# Patient Record
Sex: Male | Born: 1945 | Race: White | Hispanic: No | Smoking: Former smoker
Health system: Southern US, Community
[De-identification: ages and names within clinical notes are randomized; demographics above are authoritative.]

## PROBLEM LIST (undated history)

## (undated) DIAGNOSIS — B019 Varicella without complication: Secondary | ICD-10-CM

## (undated) DIAGNOSIS — E785 Hyperlipidemia, unspecified: Secondary | ICD-10-CM

## (undated) DIAGNOSIS — J069 Acute upper respiratory infection, unspecified: Secondary | ICD-10-CM

## (undated) DIAGNOSIS — W19XXXA Unspecified fall, initial encounter: Secondary | ICD-10-CM

## (undated) DIAGNOSIS — I639 Cerebral infarction, unspecified: Secondary | ICD-10-CM

## (undated) DIAGNOSIS — G629 Polyneuropathy, unspecified: Secondary | ICD-10-CM

## (undated) DIAGNOSIS — B059 Measles without complication: Secondary | ICD-10-CM

## (undated) DIAGNOSIS — K219 Gastro-esophageal reflux disease without esophagitis: Secondary | ICD-10-CM

## (undated) DIAGNOSIS — T7840XA Allergy, unspecified, initial encounter: Secondary | ICD-10-CM

## (undated) DIAGNOSIS — M1990 Unspecified osteoarthritis, unspecified site: Secondary | ICD-10-CM

## (undated) DIAGNOSIS — G4730 Sleep apnea, unspecified: Secondary | ICD-10-CM

## (undated) DIAGNOSIS — J383 Other diseases of vocal cords: Secondary | ICD-10-CM

## (undated) DIAGNOSIS — Z9189 Other specified personal risk factors, not elsewhere classified: Secondary | ICD-10-CM

## (undated) DIAGNOSIS — E119 Type 2 diabetes mellitus without complications: Secondary | ICD-10-CM

## (undated) DIAGNOSIS — B029 Zoster without complications: Secondary | ICD-10-CM

## (undated) DIAGNOSIS — I739 Peripheral vascular disease, unspecified: Secondary | ICD-10-CM

## (undated) HISTORY — DX: Zoster without complications: B02.9

## (undated) HISTORY — DX: Unspecified osteoarthritis, unspecified site: M19.90

## (undated) HISTORY — DX: Gastro-esophageal reflux disease without esophagitis: K21.9

## (undated) HISTORY — DX: Other specified personal risk factors, not elsewhere classified: Z91.89

## (undated) HISTORY — DX: Sleep apnea, unspecified: G47.30

## (undated) HISTORY — DX: Measles without complication: B05.9

## (undated) HISTORY — DX: Unspecified fall, initial encounter: W19.XXXA

## (undated) HISTORY — DX: Cerebral infarction, unspecified: I63.9

## (undated) HISTORY — DX: Peripheral vascular disease, unspecified: I73.9

## (undated) HISTORY — DX: Type 2 diabetes mellitus without complications: E11.9

## (undated) HISTORY — DX: Other diseases of vocal cords: J38.3

## (undated) HISTORY — DX: Acute upper respiratory infection, unspecified: J06.9

## (undated) HISTORY — DX: Allergy, unspecified, initial encounter: T78.40XA

## (undated) HISTORY — DX: Polyneuropathy, unspecified: G62.9

## (undated) HISTORY — DX: Hyperlipidemia, unspecified: E78.5

## (undated) HISTORY — DX: Varicella without complication: B01.9

---

## 1998-01-19 HISTORY — PX: APPENDECTOMY: SHX54

## 2005-01-19 DIAGNOSIS — I639 Cerebral infarction, unspecified: Secondary | ICD-10-CM

## 2005-01-19 HISTORY — DX: Cerebral infarction, unspecified: I63.9

## 2005-04-16 ENCOUNTER — Emergency Department (HOSPITAL_COMMUNITY): Admission: EM | Admit: 2005-04-16 | Discharge: 2005-04-16 | Payer: Self-pay | Admitting: Emergency Medicine

## 2005-06-09 ENCOUNTER — Encounter: Admission: RE | Admit: 2005-06-09 | Discharge: 2005-06-09 | Payer: Self-pay | Admitting: Family Medicine

## 2007-01-20 LAB — HM COLONOSCOPY: HM Colonoscopy: NORMAL

## 2008-01-20 HISTORY — PX: EYE SURGERY: SHX253

## 2010-09-20 HISTORY — PX: CAROTID ENDARTERECTOMY: SUR193

## 2012-05-17 ENCOUNTER — Other Ambulatory Visit: Payer: Self-pay | Admitting: Internal Medicine

## 2012-05-17 ENCOUNTER — Other Ambulatory Visit: Payer: Self-pay | Admitting: *Deleted

## 2012-05-17 DIAGNOSIS — M6281 Muscle weakness (generalized): Secondary | ICD-10-CM

## 2012-06-10 ENCOUNTER — Other Ambulatory Visit: Payer: Self-pay

## 2012-06-10 DIAGNOSIS — Z1389 Encounter for screening for other disorder: Secondary | ICD-10-CM | POA: Diagnosis not present

## 2013-05-11 ENCOUNTER — Encounter: Payer: Self-pay | Admitting: Family Medicine

## 2013-05-11 ENCOUNTER — Ambulatory Visit (INDEPENDENT_AMBULATORY_CARE_PROVIDER_SITE_OTHER): Payer: Medicare Other | Admitting: Family Medicine

## 2013-05-11 VITALS — BP 130/70 | HR 66 | Temp 98.3°F | Ht 74.0 in | Wt 230.1 lb

## 2013-05-11 DIAGNOSIS — K219 Gastro-esophageal reflux disease without esophagitis: Secondary | ICD-10-CM | POA: Insufficient documentation

## 2013-05-11 DIAGNOSIS — G4730 Sleep apnea, unspecified: Secondary | ICD-10-CM | POA: Insufficient documentation

## 2013-05-11 DIAGNOSIS — T7840XA Allergy, unspecified, initial encounter: Secondary | ICD-10-CM | POA: Diagnosis not present

## 2013-05-11 DIAGNOSIS — E785 Hyperlipidemia, unspecified: Secondary | ICD-10-CM

## 2013-05-11 DIAGNOSIS — G629 Polyneuropathy, unspecified: Secondary | ICD-10-CM | POA: Insufficient documentation

## 2013-05-11 DIAGNOSIS — J383 Other diseases of vocal cords: Secondary | ICD-10-CM | POA: Insufficient documentation

## 2013-05-11 DIAGNOSIS — K0889 Other specified disorders of teeth and supporting structures: Secondary | ICD-10-CM | POA: Diagnosis not present

## 2013-05-11 DIAGNOSIS — B029 Zoster without complications: Secondary | ICD-10-CM | POA: Insufficient documentation

## 2013-05-11 DIAGNOSIS — I779 Disorder of arteries and arterioles, unspecified: Secondary | ICD-10-CM

## 2013-05-11 DIAGNOSIS — Z9189 Other specified personal risk factors, not elsewhere classified: Secondary | ICD-10-CM

## 2013-05-11 DIAGNOSIS — I739 Peripheral vascular disease, unspecified: Secondary | ICD-10-CM

## 2013-05-11 DIAGNOSIS — I639 Cerebral infarction, unspecified: Secondary | ICD-10-CM | POA: Insufficient documentation

## 2013-05-11 DIAGNOSIS — B019 Varicella without complication: Secondary | ICD-10-CM | POA: Insufficient documentation

## 2013-05-11 DIAGNOSIS — E119 Type 2 diabetes mellitus without complications: Secondary | ICD-10-CM | POA: Insufficient documentation

## 2013-05-11 DIAGNOSIS — W19XXXA Unspecified fall, initial encounter: Secondary | ICD-10-CM | POA: Insufficient documentation

## 2013-05-11 DIAGNOSIS — E782 Mixed hyperlipidemia: Secondary | ICD-10-CM | POA: Insufficient documentation

## 2013-05-11 HISTORY — DX: Hyperlipidemia, unspecified: E78.5

## 2013-05-11 HISTORY — DX: Other specified personal risk factors, not elsewhere classified: Z91.89

## 2013-05-11 HISTORY — DX: Disorder of arteries and arterioles, unspecified: I77.9

## 2013-05-11 MED ORDER — FLUTICASONE PROPIONATE 50 MCG/ACT NA SUSP: 2.0000 | Freq: Every day | NASAL | Status: DC

## 2013-05-11 MED ORDER — IPRATROPIUM BROMIDE 0.03 % NA SOLN: 2.0000 | Freq: Two times a day (BID) | NASAL | Status: DC

## 2013-05-11 MED ORDER — INSULIN ASPART 100 UNIT/ML ~~LOC~~ SOLN: 5.0000 [IU] | Freq: Two times a day (BID) | SUBCUTANEOUS | Status: DC

## 2013-05-11 NOTE — Progress Notes (Signed)
Pre visit review using our clinic review tool, if applicable. No additional management support is needed unless otherwise documented below in the visit note. 

## 2013-05-11 NOTE — Patient Instructions (Signed)
Preventive Care for Adults, Male A healthy lifestyle and preventive care can promote health and wellness. Preventive health guidelines for men include the following key practices:  A routine yearly physical is a good way to check with your health care provider about your health and preventative screening. It is a chance to share any concerns and updates on your health and to receive a thorough exam.  Visit your dentist for a routine exam and preventative care every 6 months. Brush your teeth twice a day and floss once a day. Good oral hygiene prevents tooth decay and gum disease.  The frequency of eye exams is based on your age, health, family medical history, use of contact lenses, and other factors. Follow your health care provider's recommendations for frequency of eye exams.  Eat a healthy diet. Foods such as vegetables, fruits, whole grains, low-fat dairy products, and lean protein foods contain the nutrients you need without too many calories. Decrease your intake of foods high in solid fats, added sugars, and salt. Eat the right amount of calories for you.Get information about a proper diet from your health care provider, if necessary.  Regular physical exercise is one of the most important things you can do for your health. Most adults should get at least 150 minutes of moderate-intensity exercise (any activity that increases your heart rate and causes you to sweat) each week. In addition, most adults need muscle-strengthening exercises on 2 or more days a week.  Maintain a healthy weight. The body mass index (BMI) is a screening tool to identify possible weight problems. It provides an estimate of body fat based on height and weight. Your health care provider can find your BMI and can help you achieve or maintain a healthy weight.For adults 20 years and older:  A BMI below 18.5 is considered underweight.  A BMI of 18.5 to 24.9 is normal.  A BMI of 25 to 29.9 is considered  overweight.  A BMI of 30 and above is considered obese.  Maintain normal blood lipids and cholesterol levels by exercising and minimizing your intake of saturated fat. Eat a balanced diet with plenty of fruit and vegetables. Blood tests for lipids and cholesterol should begin at age 42 and be repeated every 5 years. If your lipid or cholesterol levels are high, you are over 50, or you are at high risk for heart disease, you may need your cholesterol levels checked more frequently.Ongoing high lipid and cholesterol levels should be treated with medicines if diet and exercise are not working.  If you smoke, find out from your health care provider how to quit. If you do not use tobacco, do not start.  Lung cancer screening is recommended for adults aged 24 80 years who are at high risk for developing lung cancer because of a history of smoking. A yearly low-dose CT scan of the lungs is recommended for people who have at least a 30-pack-year history of smoking and are a current smoker or have quit within the past 15 years. A pack year of smoking is smoking an average of 1 pack of cigarettes a day for 1 year (for example: 1 pack a day for 30 years or 2 packs a day for 15 years). Yearly screening should continue until the smoker has stopped smoking for at least 15 years. Yearly screening should be stopped for people who develop a health problem that would prevent them from having lung cancer treatment.  If you choose to drink alcohol, do not have  more than 2 drinks per day. One drink is considered to be 12 ounces (355 mL) of beer, 5 ounces (148 mL) of wine, or 1.5 ounces (44 mL) of liquor.  Avoid use of street drugs. Do not share needles with anyone. Ask for help if you need support or instructions about stopping the use of drugs.  High blood pressure causes heart disease and increases the risk of stroke. Your blood pressure should be checked at least every 1 2 years. Ongoing high blood pressure should be  treated with medicines, if weight loss and exercise are not effective.  If you are 75 67 years old, ask your health care provider if you should take aspirin to prevent heart disease.  Diabetes screening involves taking a blood sample to check your fasting blood sugar level. This should be done once every 3 years, after age 19, if you are within normal weight and without risk factors for diabetes. Testing should be considered at a younger age or be carried out more frequently if you are overweight and have at least 1 risk factor for diabetes.  Colorectal cancer can be detected and often prevented. Most routine colorectal cancer screening begins at the age of 47 and continues through age 80. However, your health care provider may recommend screening at an earlier age if you have risk factors for colon cancer. On a yearly basis, your health care provider may provide home test kits to check for hidden blood in the stool. Use of a small camera at the end of a tube to directly examine the colon (sigmoidoscopy or colonoscopy) can detect the earliest forms of colorectal cancer. Talk to your health care provider about this at age 66, when routine screening begins. Direct exam of the colon should be repeated every 5 10 years through age 19, unless early forms of precancerous polyps or small growths are found.  People who are at an increased risk for hepatitis B should be screened for this virus. You are considered at high risk for hepatitis B if:  You were born in a country where hepatitis B occurs often. Talk with your health care provider about which countries are considered high-risk.  Your parents were born in a high-risk country and you have not received a shot to protect against hepatitis B (hepatitis B vaccine).  You have HIV or AIDS.  You use needles to inject street drugs.  You live with, or have sex with, someone who has hepatitis B.  You are a man who has sex with other men (MSM).  You get  hemodialysis treatment.  You take certain medicines for conditions such as cancer, organ transplantation, and autoimmune conditions.  Hepatitis C blood testing is recommended for all people born from [birth year] through 1965 and any individual with known risks for hepatitis C.  Practice safe sex. Use condoms and avoid high-risk sexual practices to reduce the spread of sexually transmitted infections (STIs). STIs include gonorrhea, chlamydia, syphilis, trichomonas, herpes, HPV, and human immunodeficiency virus (HIV). Herpes, HIV, and HPV are viral illnesses that have no cure. They can result in disability, cancer, and death.  A one-time screening for abdominal aortic aneurysm (AAA) and surgical repair of large AAAs by ultrasound are recommended for men ages 94 to 74 years who are current or former smokers.  Healthy men should no longer receive prostate-specific antigen (PSA) blood tests as part of routine cancer screening. Talk with your health care provider about prostate cancer screening.  Testicular cancer screening is not recommended  for adult males who have no symptoms. Screening includes self-exam, a health care provider exam, and other screening tests. Consult with your health care provider about any symptoms you have or any concerns you have about testicular cancer.  Use sunscreen. Apply sunscreen liberally and repeatedly throughout the day. You should seek shade when your shadow is shorter than you. Protect yourself by wearing long sleeves, pants, a wide-brimmed hat, and sunglasses year round, whenever you are outdoors.  Once a month, do a whole-body skin exam, using a mirror to look at the skin on your back. Tell your health care provider about new moles, moles that have irregular borders, moles that are larger than a pencil eraser, or moles that have changed in shape or color.  Stay current with required vaccines (immunizations).  Influenza vaccine. All adults should be immunized every  year.  Tetanus, diphtheria, and acellular pertussis (Td, Tdap) vaccine. An adult who has not previously received Tdap or who does not know his vaccine status should receive 1 dose of Tdap. This initial dose should be followed by tetanus and diphtheria toxoids (Td) booster doses every 10 years. Adults with an unknown or incomplete history of completing a 3-dose immunization series with Td-containing vaccines should begin or complete a primary immunization series including a Tdap dose. Adults should receive a Td booster every 10 years.  Varicella vaccine. An adult without evidence of immunity to varicella should receive 2 doses or a second dose if he has previously received 1 dose.  Human papillomavirus (HPV) vaccine. Males aged 44 21 years who have not received the vaccine previously should receive the 3-dose series. Males aged 43 26 years may be immunized. Immunization is recommended through the age of 50 years for any male who has sex with males and did not get any or all doses earlier. Immunization is recommended for any person with an immunocompromised condition through the age of 23 years if he did not get any or all doses earlier. During the 3-dose series, the second dose should be obtained 4 8 weeks after the first dose. The third dose should be obtained 24 weeks after the first dose and 16 weeks after the second dose.  Zoster vaccine. One dose is recommended for adults aged 96 years or older unless certain conditions are present.  Measles, mumps, and rubella (MMR) vaccine. Adults born before 55 generally are considered immune to measles and mumps. Adults born in [birth year] or later should have 1 or more doses of MMR vaccine unless there is a contraindication to the vaccine or there is laboratory evidence of immunity to each of the three diseases. A routine second dose of MMR vaccine should be obtained at least 28 days after the first dose for students attending postsecondary schools, health care  workers, or international travelers. People who received inactivated measles vaccine or an unknown type of measles vaccine during 1963 1967 should receive 2 doses of MMR vaccine. People who received inactivated mumps vaccine or an unknown type of mumps vaccine before 1979 and are at high risk for mumps infection should consider immunization with 2 doses of MMR vaccine. Unvaccinated health care workers born before [birth year] who lack laboratory evidence of measles, mumps, or rubella immunity or laboratory confirmation of disease should consider measles and mumps immunization with 2 doses of MMR vaccine or rubella immunization with 1 dose of MMR vaccine.  Pneumococcal 13-valent conjugate (PCV13) vaccine. When indicated, a person who is uncertain of his immunization history and has no record of immunization  should receive the PCV13 vaccine. An adult aged 67 years or older who has certain medical conditions and has not been previously immunized should receive 1 dose of PCV13 vaccine. This PCV13 should be followed with a dose of pneumococcal polysaccharide (PPSV23) vaccine. The PPSV23 vaccine dose should be obtained at least 8 weeks after the dose of PCV13 vaccine. An adult aged 79 years or older who has certain medical conditions and previously received 1 or more doses of PPSV23 vaccine should receive 1 dose of PCV13. The PCV13 vaccine dose should be obtained 1 or more years after the last PPSV23 vaccine dose.  Pneumococcal polysaccharide (PPSV23) vaccine. When PCV13 is also indicated, PCV13 should be obtained first. All adults aged 74 years and older should be immunized. An adult younger than age 50 years who has certain medical conditions should be immunized. Any person who resides in a nursing home or long-term care facility should be immunized. An adult smoker should be immunized. People with an immunocompromised condition and certain other conditions should receive both PCV13 and PPSV23 vaccines. People with human  immunodeficiency virus (HIV) infection should be immunized as soon as possible after diagnosis. Immunization during chemotherapy or radiation therapy should be avoided. Routine use of PPSV23 vaccine is not recommended for American Indians, [place] Natives, or people younger than 65 years unless there are medical conditions that require PPSV23 vaccine. When indicated, people who have unknown immunization and have no record of immunization should receive PPSV23 vaccine. One-time revaccination 5 years after the first dose of PPSV23 is recommended for people aged 41 64 years who have chronic kidney failure, nephrotic syndrome, asplenia, or immunocompromised conditions. People who received 1 2 doses of PPSV23 before age 15 years should receive another dose of PPSV23 vaccine at age 48 years or later if at least 5 years have passed since the previous dose. Doses of PPSV23 are not needed for people immunized with PPSV23 at or after age 69 years.  Meningococcal vaccine. Adults with asplenia or persistent complement component deficiencies should receive 2 doses of quadrivalent meningococcal conjugate (MenACWY-D) vaccine. The doses should be obtained at least 2 months apart. Microbiologists working with certain meningococcal bacteria, [place] recruits, people at risk during an outbreak, and people who travel to or live in countries with a high rate of meningitis should be immunized. A first-year college student up through age 7 years who is living in a residence hall should receive a dose if he did not receive a dose on or after his 16th birthday. Adults who have certain high-risk conditions should receive one or more doses of vaccine.  Hepatitis A vaccine. Adults who wish to be protected from this disease, have certain high-risk conditions, work with hepatitis A-infected animals, work in hepatitis A research labs, or travel to or work in countries with a high rate of hepatitis A should be immunized. Adults who were  previously unvaccinated and who anticipate close contact with an international adoptee during the first 60 days after arrival in the [place] from a country with a high rate of hepatitis A should be immunized.  Hepatitis B vaccine. Adults who wish to be protected from this disease, have certain high-risk conditions, may be exposed to blood or other infectious body fluids, are household contacts or sex partners of hepatitis B positive people, are clients or workers in certain care facilities, or travel to or work in countries with a high rate of hepatitis B should be immunized.  Haemophilus influenzae type b (Hib) vaccine. A  previously unvaccinated person with asplenia or sickle cell disease or having a scheduled splenectomy should receive 1 dose of Hib vaccine. Regardless of previous immunization, a recipient of a hematopoietic stem cell transplant should receive a 3-dose series 6 12 months after his successful transplant. Hib vaccine is not recommended for adults with HIV infection. Preventive Service / Frequency Ages 62 to 3  Blood pressure check.** / Every 1 to 2 years.  Lipid and cholesterol check.** / Every 5 years beginning at age 43.  Hepatitis C blood test.** / For any individual with known risks for hepatitis C.  Skin self-exam. / Monthly.  Influenza vaccine. / Every year.  Tetanus, diphtheria, and acellular pertussis (Tdap, Td) vaccine.** / Consult your health care provider. 1 dose of Td every 10 years.  Varicella vaccine.** / Consult your health care provider.  HPV vaccine. / 3 doses over 6 months, if 48 or younger.  Measles, mumps, rubella (MMR) vaccine.** / You need at least 1 dose of MMR if you were born in 1957 or later. You may also need a second dose.  Pneumococcal 13-valent conjugate (PCV13) vaccine.** / Consult your health care provider.  Pneumococcal polysaccharide (PPSV23) vaccine.** / 1 to 2 doses if you smoke cigarettes or if you have certain  conditions.  Meningococcal vaccine.** / 1 dose if you are age 8 to 70 years and a Market researcher living in a residence hall, or have one of several medical conditions. You may also need additional booster doses.  Hepatitis A vaccine.** / Consult your health care provider.  Hepatitis B vaccine.** / Consult your health care provider.  Haemophilus influenzae type b (Hib) vaccine.** / Consult your health care provider. Ages 48 to 32  Blood pressure check.** / Every 1 to 2 years.  Lipid and cholesterol check.** / Every 5 years beginning at age 38.  Lung cancer screening. / Every year if you are aged 40 80 years and have a 30-pack-year history of smoking and currently smoke or have quit within the past 15 years. Yearly screening is stopped once you have quit smoking for at least 15 years or develop a health problem that would prevent you from having lung cancer treatment.  Fecal occult blood test (FOBT) of stool. / Every year beginning at age 4 and continuing until age 70. You may not have to do this test if you get a colonoscopy every 10 years.  Flexible sigmoidoscopy** or colonoscopy.** / Every 5 years for a flexible sigmoidoscopy or every 10 years for a colonoscopy beginning at age 76 and continuing until age 62.  Hepatitis C blood test.** / For all people born from [birth year] through 1965 and any individual with known risks for hepatitis C.  Skin self-exam. / Monthly.  Influenza vaccine. / Every year.  Tetanus, diphtheria, and acellular pertussis (Tdap/Td) vaccine.** / Consult your health care provider. 1 dose of Td every 10 years.  Varicella vaccine.** / Consult your health care provider.  Zoster vaccine.** / 1 dose for adults aged 60 years or older.  Measles, mumps, rubella (MMR) vaccine.** / You need at least 1 dose of MMR if you were born in 1957 or later. You may also need a second dose.  Pneumococcal 13-valent conjugate (PCV13) vaccine.** / Consult your health care  provider.  Pneumococcal polysaccharide (PPSV23) vaccine.** / 1 to 2 doses if you smoke cigarettes or if you have certain conditions.  Meningococcal vaccine.** / Consult your health care provider.  Hepatitis A vaccine.** / Consult your health care  provider.  Hepatitis B vaccine.** / Consult your health care provider.  Haemophilus influenzae type b (Hib) vaccine.** / Consult your health care provider. Ages 65 and over  Blood pressure check.** / Every 1 to 2 years.  Lipid and cholesterol check.**/ Every 5 years beginning at age 20.  Lung cancer screening. / Every year if you are aged 55 80 years and have a 30-pack-year history of smoking and currently smoke or have quit within the past 15 years. Yearly screening is stopped once you have quit smoking for at least 15 years or develop a health problem that would prevent you from having lung cancer treatment.  Fecal occult blood test (FOBT) of stool. / Every year beginning at age 50 and continuing until age 75. You may not have to do this test if you get a colonoscopy every 10 years.  Flexible sigmoidoscopy** or colonoscopy.** / Every 5 years for a flexible sigmoidoscopy or every 10 years for a colonoscopy beginning at age 50 and continuing until age 75.  Hepatitis C blood test.** / For all people born from 1945 through 1965 and any individual with known risks for hepatitis C.  Abdominal aortic aneurysm (AAA) screening.** / A one-time screening for ages 65 to 75 years who are current or former smokers.  Skin self-exam. / Monthly.  Influenza vaccine. / Every year.  Tetanus, diphtheria, and acellular pertussis (Tdap/Td) vaccine.** / 1 dose of Td every 10 years.  Varicella vaccine.** / Consult your health care provider.  Zoster vaccine.** / 1 dose for adults aged 60 years or older.  Pneumococcal 13-valent conjugate (PCV13) vaccine.** / Consult your health care provider.  Pneumococcal polysaccharide (PPSV23) vaccine.** / 1 dose for all  adults aged 65 years and older.  Meningococcal vaccine.** / Consult your health care provider.  Hepatitis A vaccine.** / Consult your health care provider.  Hepatitis B vaccine.** / Consult your health care provider.  Haemophilus influenzae type b (Hib) vaccine.** / Consult your health care provider. **Family history and personal history of risk and conditions may change your health care provider's recommendations. Document Released: 03/03/2001 Document Revised: 10/26/2012 Document Reviewed: 06/02/2010 [employer] Patient Information 2014 [employer]  

## 2013-05-14 ENCOUNTER — Encounter: Payer: Self-pay | Admitting: Family Medicine

## 2013-05-14 NOTE — Assessment & Plan Note (Signed)
Avoid offending foods, start probiotics. Do not eat large meals in late evening and consider raising head of bed.  

## 2013-05-14 NOTE — Progress Notes (Signed)
Patient ID: Matthew [name], male   DOB: 1945/05/04, 68 y.o.   MRN: [id number] TARUN PATCHELL [id number] 07/08/1945 05/14/2013      Progress Note-Follow Up  Subjective  Chief Complaint  Chief Complaint  Patient presents with  . Establish Care    new patient    HPI  Patient is a 68 year old male in today for routine medical care. He is here to establish care. He generally seeks his care at the [facility] but is having trouble getting his carotid artery disease managed year without having to drive to [facility]. He has been told he has significant stenosis in bilateral carotid arteries and he brings in imaging which confirms this today. He is asymptomatic. No syncope or headache. [facility] and [facility] so. Denies CP/palp/SOB/HA/congestion/fevers/GI or GU c/o. Taking meds as prescribed  Past Medical History  Diagnosis Date  . Chicken pox as a child  . Stroke 2007  . Fall     every since his stroke  . Shingles 68 yrs old  . Diabetes mellitus without complication   . Other and unspecified hyperlipidemia 05/11/2013  . Poor dental hygiene 05/11/2013  . Carotid artery disease 05/11/2013  . Measles   . Sleep apnea     uses CP[facility]  . Peripheral neuropathy   . Vocal cord dysfunction   . Allergy   . GERD (gastroesophageal reflux disease)     Past Surgical History  Procedure Laterality Date  . Eye surgery  2010    cataracts bilateral  . Appendectomy  2000    ruptured    Family History  Problem Relation Age of Onset  . Cancer Mother     kidney  . Heart attack Father   . Stroke Brother   . Heart attack Brother   . Stroke Brother     History   Social History  . Marital Status: Divorced    Spouse Name: N/A    Number of Children: N/A  . Years of Education: N/A   Occupational History  . Not on file.   Social History Main Topics  . Smoking status: Former Smoker -- 10 years    Types: Cigars    Start date:  11/27/1997  . Smokeless tobacco: Never Used  . Alcohol Use: No     Comment: drank a fifth of bourbon everyday in the 90's  . Drug Use: No  . Sexual Activity: No     Comment: lives with roommate, minimal carbs   Other Topics Concern  . Not on file   Social History Narrative  . No narrative on file    No current outpatient prescriptions on file prior to visit.   No current facility-administered medications on file prior to visit.    Allergies  Allergen Reactions  . Aggrenox [Aspirin-Dipyridamole Er] Shortness Of Breath    Review of Systems  Review of Systems  Constitutional: Negative for fever, chills and malaise/fatigue.  HENT: Negative for congestion, hearing loss and nosebleeds.   Eyes: Negative for discharge.  Respiratory: Negative for cough, sputum production, shortness of breath and wheezing.   Cardiovascular: Negative for chest pain, palpitations and leg swelling.  Gastrointestinal: Negative for heartburn, nausea, vomiting, abdominal pain, diarrhea, constipation and blood in stool.  Genitourinary: Negative for dysuria, urgency, frequency and hematuria.  Musculoskeletal: Negative for back pain, falls and myalgias.  Skin: Negative for rash.  Neurological: Negative for dizziness, tremors, sensory change, focal weakness, loss  of consciousness, weakness and headaches.  Endo/Heme/Allergies: Negative for polydipsia. Does not bruise/bleed easily.  Psychiatric/Behavioral: Negative for depression and suicidal ideas. The patient is not nervous/anxious and does not have insomnia.     Objective  BP 130/70  Pulse 66  Temp(Src) 98.3 F (36.8 C) (Oral)  Ht 6\' 2"  (1.88 m)  Wt 230 lb 1.9 oz (104.382 kg)  BMI 29.53 kg/m2  SpO2 97%  Physical Exam  Physical Exam  Constitutional: He is oriented to person, place, and time and well-developed, well-nourished, and in no distress. No distress.  HENT:  Head: Normocephalic and atraumatic.  Poor dentition  Eyes: Conjunctivae are  normal.  Neck: Neck supple. No thyromegaly present.  Cardiovascular: Normal rate, regular rhythm and normal heart sounds.   No murmur heard. Pulmonary/Chest: Effort normal and breath sounds normal. No respiratory distress.  Abdominal: He exhibits no distension and no mass. There is no tenderness.  Musculoskeletal: He exhibits no edema.  Neurological: He is alert and oriented to person, place, and time.  Skin: Skin is warm.  Psychiatric: Memory, affect and judgment normal.    Assessment & Plan  Diabetes mellitus without complication [id number] acceptable, minimize simple carbs. Increase exercise as tolerated. Continue current meds  Other and unspecified hyperlipidemia Encouraged heart healthy diet, increase exercise, avoid trans fats, consider a krill oil cap daily. TGs elevated with previous PMD, encouraged to minimize simple carbs and saturated fats.   GERD (gastroesophageal reflux disease) Avoid offending foods, start probiotics. Do not eat large meals in late evening and consider raising head of bed.   Carotid artery disease Patient brings in results of CTA and carotid ultrasound done at [facility] which show hi grade stenosis b/l although results vary somewhat by test. He is only able to receive care for this with the [place] except in [place] and he is unable to get there. Will refer to local vascular practice if we can find coverage.

## 2013-05-14 NOTE — Assessment & Plan Note (Signed)
hgba1c acceptable, minimize simple carbs. Increase exercise as tolerated. Continue current meds 

## 2013-05-14 NOTE — Assessment & Plan Note (Signed)
Patient brings in results of CTA and carotid ultrasound done at [facility] which show hi grade stenosis b/l although results vary somewhat by test. He is only able to receive care for this with the [place] except in [place] and he is unable to get there. Will refer to local vascular practice if we can find coverage.

## 2013-05-14 NOTE — Assessment & Plan Note (Signed)
Encouraged heart healthy diet, increase exercise, avoid trans fats, consider a krill oil cap daily. TGs elevated with previous PMD, encouraged to minimize simple carbs and saturated fats.

## 2013-05-19 ENCOUNTER — Telehealth: Payer: Self-pay

## 2013-05-19 NOTE — Telephone Encounter (Signed)
Relevant patient education assigned to patient using Emmi. ° °

## 2013-05-30 ENCOUNTER — Telehealth: Payer: Self-pay | Admitting: Family Medicine

## 2013-05-30 ENCOUNTER — Encounter: Payer: Medicare Other | Admitting: Vascular Surgery

## 2013-05-30 NOTE — Telephone Encounter (Signed)
Dr Charlett Blake ordered a consult with Dr Curt Jews.  The patient has had to cancel because he [facility] and has learned that they cannot do anything about the blockage in his neck until he has his teeth removed .  He does not have insurance to take care of all that. Do you have any suggestions on what he can do.  The vascular doctors at the [place] have asked the [place] in [place] to remove his teeth.

## 2013-05-30 NOTE — Telephone Encounter (Signed)
Unfortunately I do not have any great ideas, he should proceed with having his teeth out if at all possible and then he can proceed with vascualar

## 2013-06-01 ENCOUNTER — Encounter: Payer: Self-pay | Admitting: Family Medicine

## 2013-06-01 NOTE — Telephone Encounter (Signed)
Patient informed and voiced understanding. Pt states the [place] doctors are setting a consultation in [place] for him

## 2013-07-31 ENCOUNTER — Telehealth: Payer: Self-pay | Admitting: *Deleted

## 2013-07-31 ENCOUNTER — Telehealth: Payer: Self-pay

## 2013-07-31 DIAGNOSIS — E785 Hyperlipidemia, unspecified: Secondary | ICD-10-CM | POA: Diagnosis not present

## 2013-07-31 DIAGNOSIS — E119 Type 2 diabetes mellitus without complications: Secondary | ICD-10-CM

## 2013-07-31 DIAGNOSIS — R531 Weakness: Secondary | ICD-10-CM

## 2013-07-31 LAB — LIPID PANEL
Cholesterol: 161 mg/dL (ref 0–200)
HDL: 25 mg/dL — ABNORMAL LOW (ref >39)
LDL Cholesterol: 92 mg/dL (ref 0–99)
TRIGLYCERIDES: 219 mg/dL — AB (ref <150)
Total CHOL/HDL Ratio: 6.4 Ratio
VLDL: 44 mg/dL — ABNORMAL HIGH (ref 0–40)

## 2013-07-31 LAB — HEMOGLOBIN A1C
HEMOGLOBIN A1C: 6.3 % — AB (ref <5.7)
Mean Plasma Glucose: 134 mg/dL — ABNORMAL HIGH (ref <117)

## 2013-07-31 NOTE — Telephone Encounter (Signed)
Pt came in to the office of labs and told the receptionist that he felt woozy. Pt reports he is diabetic and took 5 units of novolog this morning but fell asleep and did not eat anything. Pt also took 2 benadryl and a tramadol at 6am this morning. Pt's fasting BS is 101. Pt has no other complaints and proceeded to the lab for blood draw without incident.

## 2013-07-31 NOTE — Telephone Encounter (Signed)
Noted.  Pt should book OV if symptoms worsen or if they do not improve.

## 2013-07-31 NOTE — Telephone Encounter (Signed)
Left detailed message on pt's voice mail

## 2013-07-31 NOTE — Telephone Encounter (Signed)
Diabetic Bundle- pt informed and voiced understanding.  ordered labs

## 2013-08-17 ENCOUNTER — Encounter: Payer: Self-pay | Admitting: Family Medicine

## 2013-08-17 ENCOUNTER — Ambulatory Visit (INDEPENDENT_AMBULATORY_CARE_PROVIDER_SITE_OTHER): Payer: Medicare Other | Admitting: Family Medicine

## 2013-08-17 VITALS — BP 122/58 | HR 62 | Temp 98.3°F | Ht 74.0 in | Wt 223.0 lb

## 2013-08-17 DIAGNOSIS — K219 Gastro-esophageal reflux disease without esophagitis: Secondary | ICD-10-CM | POA: Diagnosis not present

## 2013-08-17 DIAGNOSIS — E1159 Type 2 diabetes mellitus with other circulatory complications: Secondary | ICD-10-CM | POA: Diagnosis not present

## 2013-08-17 DIAGNOSIS — I779 Disorder of arteries and arterioles, unspecified: Secondary | ICD-10-CM

## 2013-08-17 DIAGNOSIS — E785 Hyperlipidemia, unspecified: Secondary | ICD-10-CM

## 2013-08-17 DIAGNOSIS — I739 Peripheral vascular disease, unspecified: Secondary | ICD-10-CM

## 2013-08-17 DIAGNOSIS — E119 Type 2 diabetes mellitus without complications: Secondary | ICD-10-CM

## 2013-08-17 MED ORDER — INSULIN ASPART 100 UNIT/ML ~~LOC~~ SOLN: 6.0000 [IU] | Freq: Two times a day (BID) | SUBCUTANEOUS | Status: DC

## 2013-08-17 MED ORDER — CYANOCOBALAMIN 500 MCG PO TABS: 1000.0000 ug | ORAL_TABLET | Freq: Every day | ORAL | Status: DC

## 2013-08-17 NOTE — Assessment & Plan Note (Addendum)
Is working with [facility] to get his surgery covered. He has been classified as a hardship so he does not have to go to [place] to have his surgery but he has to find someone who can perform his endarterectomy nearby. He is working on this with the [place]

## 2013-08-17 NOTE — Patient Instructions (Signed)
°Carotid Artery Disease °The carotid arteries are the two main arteries on either side of the neck that supply blood to the brain. Carotid artery disease, also called carotid artery stenosis, is the narrowing or blockage of one or both carotid arteries. Carotid artery disease increases your risk for a stroke or a transient ischemic attack (TIA). A TIA is an episode in which a waxy, fatty substance that accumulates within the artery (plaque) blocks blood flow to the brain. A TIA is considered a "warning stroke."  °CAUSES  °· Buildup of plaque inside the carotid arteries (atherosclerosis) (common). °· A weakened outpouching in an artery (aneurysm). °· Inflammation of the carotid artery (arteritis). °· A fibrous growth within the carotid artery (fibromuscular dysplasia). °· Tissue death within the carotid artery due to radiation treatment (post-radiation necrosis). °· Decreased blood flow due to spasms of the carotid artery (vasospasm). °· Separation of the walls of the carotid artery (carotid dissection). °RISK FACTORS °· High cholesterol (dyslipidemia).   °· High blood pressure (hypertension).   °· Smoking.   °· Obesity.   °· Diabetes.   °· Family history of cardiovascular disease.   °· Inactivity or lack of regular exercise.   °· Being male. Men have an increased risk of developing atherosclerosis earlier in life than women.   °SYMPTOMS  °Carotid artery disease does not cause symptoms. °DIAGNOSIS °Diagnosis of carotid artery disease may include:  °· A physical exam. Your health care provider may hear an abnormal sound (bruit) when listening to the carotid arteries.   °· Specific tests that look at the blood flow in the carotid arteries. These tests include:   °¨ Carotid artery ultrasonography.   °¨ Carotid or cerebral angiography.   °¨ Computerized tomographic angiography (CTA).   °¨ Magnetic resonance angiography (MRA).   °TREATMENT  °Treatment of carotid artery disease can include a combination of treatments.  Treatment options include: °· Surgery. You may have:   °¨ A carotid endarterectomy. This is a surgery to remove the blockages in the carotid arteries.   °¨ A carotid angioplasty with stenting. This is a nonsurgical interventional procedure. A wire mesh (stent) is used to widen the blocked carotid arteries.   °· Medicines to control blood pressure, cholesterol, and reduce blood clotting (antiplatelet therapy).   °· Adjusting your diet.   °· Lifestyle changes such as:   °¨ Quitting smoking.   °¨ Exercising as tolerated or as directed by your health care provider.   °¨ Controlling and maintaining a good blood pressure.   °¨ Keeping cholesterol levels under control.   °HOME CARE INSTRUCTIONS  °· Take medicines only as directed by your health care provider. Make sure you understand all your medicine instructions. Do not stop your medicines without talking to your health care provider.   °· Follow your health care provider's diet instructions. It is important to eat a healthy diet that is low in saturated fats and includes plenty of fresh fruits, vegetables, and lean meats. High-fat, high-sodium foods as well as foods that are fried, overly processed, or have poor nutritional value should be avoided. °· Maintain a healthy weight.   °· Stay physically active. It is recommended that you get at least 30 minutes of activity every day.   °· Do not use any tobacco products including cigarettes, chewing tobacco, or electronic cigarettes. If you need help quitting, ask your health care provider. °· Limit alcohol use to:   °¨ No more than 2 drinks per day for men.   °¨ No more than 1 drink per day for nonpregnant women.   °· Do not use illegal drugs.   °· Keep all follow-up visits as directed by your health   health care provider.  SEEK IMMEDIATE MEDICAL CARE IF:  You develop TIA or stroke symptoms. These include:   Sudden weakness or numbness on one side of the body, such as in the face, arm, or leg.   Sudden confusion.    Trouble speaking (aphasia) or understanding.   Sudden trouble seeing out of one or both eyes.   Sudden trouble walking.   Dizziness or feeling like you might faint.   Loss of balance or coordination.   Sudden severe headache with no known cause.   Sudden trouble swallowing (dysphagia).  If you have any of these symptoms, call your local emergency services (911 in [place] Do not drive yourself to the clinic or hospital. This is a medical emergency.  Document Released: 03/30/2011 Document Revised: 05/22/2013 Document Reviewed: 07/06/2012 Exit[employer] Patient Information 2015 [employer]. This information is not intended to replace advice given to you by your health care provider. Make sure you discuss any questions you have with your health care provider.  

## 2013-08-17 NOTE — Progress Notes (Signed)
Patient ID: Matthew [name], male   DOB: 22-Dec-1945, 68 y.o.   MRN: [id number] Matthew [name] [id number] 1945/04/13 08/17/2013      Progress Note-Follow Up  Subjective  Chief Complaint  No chief complaint on file.   HPI  Patient is a 68 year old male in today for routine medical care. [facility] so he is not required Patient does not do MRIs due to claustrophobia. No recent illness. [facility] is agreeing to [facility] for his carotid artery surgery but they are trying to find him a local practitioner that does the surgery and takes the [facility] card. Has had all of his teeth  Pulled and his sugar and Triglycerides are much better. Denies CP/palp/SOB/HA/congestion/fevers/GI or GU c/o. Taking meds as prescribed Past Medical History  Diagnosis Date  . Chicken pox as a child  . Stroke 2007  . Fall     every since his stroke  . Shingles 68 yrs old  . Diabetes mellitus without complication   . Other and unspecified hyperlipidemia 05/11/2013  . Poor dental hygiene 05/11/2013  . Carotid artery disease 05/11/2013  . Measles   . Sleep apnea     uses CPAP[facility]  . Peripheral neuropathy   . Vocal cord dysfunction   . Allergy   . GERD (gastroesophageal reflux disease)     Past Surgical History  Procedure Laterality Date  . Eye surgery  2010    cataracts bilateral  . Appendectomy  2000    ruptured    Family History  Problem Relation Age of Onset  . Cancer Mother     kidney  . Heart attack Father   . Stroke Brother   . Heart attack Brother   . Stroke Brother     History   Social History  . Marital Status: Divorced    Spouse Name: N/A    Number of Children: N/A  . Years of Education: N/A   Occupational History  . Not on file.   Social History Main Topics  . Smoking status: Former Smoker -- 10 years    Types: Cigars    Start date: 11/27/1997  . Smokeless tobacco: Never Used  . Alcohol Use: No     Comment: drank a fifth of bourbon everyday in the 90's   . Drug Use: No  . Sexual Activity: No     Comment: lives with roommate, minimal carbs   Other Topics Concern  . Not on file   Social History Narrative  . No narrative on file    Current Outpatient Prescriptions on File Prior to Visit  Medication Sig Dispense Refill  . clopidogrel (PLAVIX) 75 MG tablet Take 75 mg by mouth daily with breakfast.      . Fish Oil OIL by Does not apply route.      . fluticasone (FLONASE) 50 MCG/ACT nasal spray Place 2 sprays into both nostrils daily.  16 g  6  . insulin glargine (LANTUS) 100 UNIT/ML injection Inject 60 Units into the skin at bedtime.      Marland Kitchen ipratropium (ATROVENT) 0.03 % nasal spray Place 2 sprays into both nostrils every 12 (twelve) hours.  30 mL  12   No current facility-administered medications on file prior to visit.    Allergies  Allergen Reactions  . Aggrenox [Aspirin-Dipyridamole Er] Shortness Of Breath    Review of Systems  Review of Systems  Constitutional: Negative for fever and malaise/fatigue.  HENT: Negative for  congestion.   Eyes: Negative for discharge.  Respiratory: Negative for shortness of breath.   Cardiovascular: Negative for chest pain, palpitations and leg swelling.  Gastrointestinal: Negative for nausea, abdominal pain and diarrhea.  Genitourinary: Negative for dysuria.  Musculoskeletal: Negative for falls.  Skin: Negative for rash.  Neurological: Negative for loss of consciousness and headaches.  Endo/Heme/Allergies: Negative for polydipsia.  Psychiatric/Behavioral: Negative for depression and suicidal ideas. The patient is not nervous/anxious and does not have insomnia.     Objective  BP 122/58  Pulse 62  Temp(Src) 98.3 F (36.8 C) (Oral)  Ht 6\' 2"  (1.88 m)  Wt 223 lb (101.152 kg)  BMI 28.62 kg/m2  SpO2 97%  Physical Exam  Physical Exam  Constitutional: He is oriented to person, place, and time and well-developed, well-nourished, and in no distress. No distress.  HENT:  Head:  Normocephalic and atraumatic.  Eyes: Conjunctivae are normal.  Neck: Neck supple. No thyromegaly present.  Cardiovascular: Normal rate, regular rhythm and normal heart sounds.   No murmur heard. Pulmonary/Chest: Effort normal and breath sounds normal. No respiratory distress.  Abdominal: He exhibits no distension and no mass. There is no tenderness.  Musculoskeletal: He exhibits no edema.  Neurological: He is alert and oriented to person, place, and time.  Skin: Skin is warm.  Psychiatric: Memory, affect and judgment normal.     Lab Results  Component Value Date   CHOL 161 07/31/2013   Lab Results  Component Value Date   HDL 25* 07/31/2013   Lab Results  Component Value Date   LDLCALC 92 07/31/2013   Lab Results  Component Value Date   TRIG 219* 07/31/2013   Lab Results  Component Value Date   CHOLHDL 6.4 07/31/2013     Assessment & Plan  Carotid artery disease Is working with [facility] to get his surgery covered. He has been classified as a hardship so he does not have to go to [place] to have his surgery but he has to find someone who can perform his endarterectomy nearby. [facility]  GERD (gastroesophageal reflux disease) Avoid offending foods, start probiotics. Do not eat large meals in late evening and consider raising head of bed.   Diabetes mellitus without complication [id number] acceptable, minimize simple carbs. Increase exercise as tolerated. Continue current meds  Other and unspecified hyperlipidemia Encouraged heart healthy diet, increase exercise, avoid trans fats, consider a krill oil cap daily

## 2013-08-21 ENCOUNTER — Encounter: Payer: Self-pay | Admitting: Family Medicine

## 2013-08-21 NOTE — Assessment & Plan Note (Signed)
Encouraged heart healthy diet, increase exercise, avoid trans fats, consider a krill oil cap daily 

## 2013-08-21 NOTE — Assessment & Plan Note (Signed)
Avoid offending foods, start probiotics. Do not eat large meals in late evening and consider raising head of bed.  

## 2013-08-21 NOTE — Assessment & Plan Note (Signed)
hgba1c acceptable, minimize simple carbs. Increase exercise as tolerated. Continue current meds 

## 2013-08-29 ENCOUNTER — Encounter: Payer: Medicare Other | Admitting: Vascular Surgery

## 2013-09-19 ENCOUNTER — Ambulatory Visit: Payer: Self-pay | Admitting: Vascular Surgery

## 2013-09-19 DIAGNOSIS — Z01812 Encounter for preprocedural laboratory examination: Secondary | ICD-10-CM | POA: Diagnosis not present

## 2013-09-19 LAB — CBC
HCT: 41.9 % (ref 40.0–52.0)
HGB: 14.5 g/dL (ref 13.0–18.0)
MCH: 32.3 pg (ref 26.0–34.0)
MCHC: 34.6 g/dL (ref 32.0–36.0)
MCV: 93 fL (ref 80–100)
Platelet: 182 10*3/uL (ref 150–440)
RBC: 4.49 10*6/uL (ref 4.40–5.90)
RDW: 13.8 % (ref 11.5–14.5)
WBC: 6.7 10*3/uL (ref 3.8–10.6)

## 2013-09-19 LAB — BASIC METABOLIC PANEL
Anion Gap: 6 — ABNORMAL LOW (ref 7–16)
BUN: 12 mg/dL (ref 7–18)
CO2: 30 mmol/L (ref 21–32)
Calcium, Total: 8.9 mg/dL (ref 8.5–10.1)
Chloride: 105 mmol/L (ref 98–107)
Creatinine: 1.18 mg/dL (ref 0.60–1.30)
EGFR (African American): >60
GLUCOSE: 248 mg/dL — AB (ref 65–99)
OSMOLALITY: 289 (ref 275–301)
Potassium: 4.3 mmol/L (ref 3.5–5.1)
Sodium: 141 mmol/L (ref 136–145)

## 2013-09-19 LAB — HM DIABETES EYE EXAM

## 2013-09-22 ENCOUNTER — Telehealth: Payer: Self-pay | Admitting: Pediatrics

## 2013-09-22 NOTE — Telephone Encounter (Signed)
Charlyne Quale want to know if patient have had a EKG done. Please advise Clover Mealy  # [phone]

## 2013-09-26 NOTE — Telephone Encounter (Signed)
It doesn't look like Dr Charlett Blake did one? If this is for the surgery clearance, Dr Charlett Blake stated in her AVS that patient is working with the [facility] for clearance?

## 2013-09-28 ENCOUNTER — Inpatient Hospital Stay: Payer: Self-pay | Admitting: Vascular Surgery

## 2013-09-28 DIAGNOSIS — E119 Type 2 diabetes mellitus without complications: Secondary | ICD-10-CM | POA: Diagnosis present

## 2013-09-28 DIAGNOSIS — I658 Occlusion and stenosis of other precerebral arteries: Secondary | ICD-10-CM | POA: Diagnosis present

## 2013-09-28 DIAGNOSIS — I6529 Occlusion and stenosis of unspecified carotid artery: Secondary | ICD-10-CM | POA: Diagnosis present

## 2013-09-28 DIAGNOSIS — I708 Atherosclerosis of other arteries: Secondary | ICD-10-CM | POA: Diagnosis not present

## 2013-09-29 LAB — CBC WITH DIFFERENTIAL/PLATELET
BASOS ABS: 0.1 10*3/uL (ref 0.0–0.1)
BASOS PCT: 0.7 %
EOS PCT: 0 %
Eosinophil #: 0 10*3/uL (ref 0.0–0.7)
HCT: 43 % (ref 40.0–52.0)
HGB: 14.3 g/dL (ref 13.0–18.0)
LYMPHS ABS: 0.8 10*3/uL — AB (ref 1.0–3.6)
LYMPHS PCT: 6.4 %
MCH: 31.2 pg (ref 26.0–34.0)
MCHC: 33.3 g/dL (ref 32.0–36.0)
MCV: 94 fL (ref 80–100)
MONO ABS: 0.5 x10 3/mm (ref 0.2–1.0)
Monocyte %: 4.2 %
NEUTROS ABS: 10.9 10*3/uL — AB (ref 1.4–6.5)
NEUTROS PCT: 88.7 %
Platelet: 181 10*3/uL (ref 150–440)
RBC: 4.59 10*6/uL (ref 4.40–5.90)
RDW: 14.1 % (ref 11.5–14.5)
WBC: 12.3 10*3/uL — ABNORMAL HIGH (ref 3.8–10.6)

## 2013-09-29 LAB — BASIC METABOLIC PANEL
ANION GAP: 7 (ref 7–16)
BUN: 16 mg/dL (ref 7–18)
CO2: 25 mmol/L (ref 21–32)
CREATININE: 1.22 mg/dL (ref 0.60–1.30)
Calcium, Total: 8.2 mg/dL — ABNORMAL LOW (ref 8.5–10.1)
Chloride: 103 mmol/L (ref 98–107)
EGFR (African American): >60
GLUCOSE: 216 mg/dL — AB (ref 65–99)
OSMOLALITY: 278 (ref 275–301)
POTASSIUM: 4.8 mmol/L (ref 3.5–5.1)
Sodium: 135 mmol/L — ABNORMAL LOW (ref 136–145)

## 2013-09-29 LAB — PROTIME-INR
INR: 1.1
PROTHROMBIN TIME: 14.4 s (ref 11.5–14.7)

## 2013-09-29 LAB — APTT: ACTIVATED PTT: 30.7 s (ref 23.6–35.9)

## 2013-10-02 LAB — PATHOLOGY REPORT

## 2014-01-02 LAB — HM DIABETES FOOT EXAM

## 2014-02-13 ENCOUNTER — Ambulatory Visit (INDEPENDENT_AMBULATORY_CARE_PROVIDER_SITE_OTHER): Payer: Medicare Other | Admitting: Family Medicine

## 2014-02-13 ENCOUNTER — Encounter: Payer: Self-pay | Admitting: Family Medicine

## 2014-02-13 VITALS — BP 143/65 | HR 64 | Temp 98.2°F | Ht 74.0 in | Wt 228.4 lb

## 2014-02-13 DIAGNOSIS — E119 Type 2 diabetes mellitus without complications: Secondary | ICD-10-CM

## 2014-02-13 DIAGNOSIS — E782 Mixed hyperlipidemia: Secondary | ICD-10-CM

## 2014-02-13 DIAGNOSIS — K219 Gastro-esophageal reflux disease without esophagitis: Secondary | ICD-10-CM

## 2014-02-13 DIAGNOSIS — J019 Acute sinusitis, unspecified: Secondary | ICD-10-CM

## 2014-02-13 DIAGNOSIS — J069 Acute upper respiratory infection, unspecified: Secondary | ICD-10-CM | POA: Diagnosis not present

## 2014-02-13 MED ORDER — AMOXICILLIN 500 MG PO CAPS: 500.0000 mg | ORAL_CAPSULE | Freq: Three times a day (TID) | ORAL | Status: DC

## 2014-02-13 MED ORDER — VARDENAFIL HCL 10 MG PO TABS: ORAL_TABLET | ORAL | Status: DC

## 2014-02-13 NOTE — Patient Instructions (Signed)
Encouraged increased rest and hydration, add probiotics such as Digestive Advantage or [employer], zinc such as Coldeze or Xicam. Treat fevers as needed. Plain Mucinex twice daily for 10 days   Upper Respiratory Infection, Adult An upper respiratory infection (URI) is also sometimes known as the common cold. The upper respiratory tract includes the nose, sinuses, throat, trachea, and bronchi. Bronchi are the airways leading to the lungs. Most people improve within 1 week, but symptoms can last up to 2 weeks. A residual cough may last even longer.  CAUSES Many different viruses can infect the tissues lining the upper respiratory tract. The tissues become irritated and inflamed and often become very moist. Mucus production is also common. A cold is contagious. You can easily spread the virus to others by oral contact. This includes kissing, sharing a glass, coughing, or sneezing. Touching your mouth or nose and then touching a surface, which is then touched by another person, can also spread the virus. SYMPTOMS  Symptoms typically develop 1 to 3 days after you come in contact with a cold virus. Symptoms vary from person to person. They may include:  Runny nose.  Sneezing.  Nasal congestion.  Sinus irritation.  Sore throat.  Loss of voice (laryngitis).  Cough.  Fatigue.  Muscle aches.  Loss of appetite.  Headache.  Low-grade fever. DIAGNOSIS  You might diagnose your own cold based on familiar symptoms, since most people get a cold 2 to 3 times a year. Your caregiver can confirm this based on your exam. Most importantly, your caregiver can check that your symptoms are not due to another disease such as strep throat, sinusitis, pneumonia, asthma, or epiglottitis. Blood tests, throat tests, and X-rays are not necessary to diagnose a common cold, but they may sometimes be helpful in excluding other more serious diseases. Your caregiver will decide if any further tests are  required. RISKS AND COMPLICATIONS  You may be at risk for a more severe case of the common cold if you smoke cigarettes, have chronic heart disease (such as heart failure) or lung disease (such as asthma), or if you have a weakened immune system. The very young and very old are also at risk for more serious infections. Bacterial sinusitis, middle ear infections, and bacterial pneumonia can complicate the common cold. The common cold can worsen asthma and chronic obstructive pulmonary disease (COPD). Sometimes, these complications can require emergency medical care and may be life-threatening. PREVENTION  The best way to protect against getting a cold is to practice good hygiene. Avoid oral or hand contact with people with cold symptoms. Wash your hands often if contact occurs. There is no clear evidence that vitamin C, vitamin E, echinacea, or exercise reduces the chance of developing a cold. However, it is always recommended to get plenty of rest and practice good nutrition. TREATMENT  Treatment is directed at relieving symptoms. There is no cure. Antibiotics are not effective, because the infection is caused by a virus, not by bacteria. Treatment may include:  Increased fluid intake. Sports drinks offer valuable electrolytes, sugars, and fluids.  Breathing heated mist or steam (vaporizer or shower).  Eating chicken soup or other clear broths, and maintaining good nutrition.  Getting plenty of rest.  Using gargles or lozenges for comfort.  Controlling fevers with ibuprofen or acetaminophen as directed by your caregiver.  Increasing usage of your inhaler if you have asthma. Zinc gel and zinc lozenges, taken in the first 24 hours of the common cold, can shorten  the duration and lessen the severity of symptoms. Pain medicines may help with fever, muscle aches, and throat pain. A variety of non-prescription medicines are available to treat congestion and runny nose. Your caregiver can make  recommendations and may suggest nasal or lung inhalers for other symptoms.  HOME CARE INSTRUCTIONS   Only take over-the-counter or prescription medicines for pain, discomfort, or fever as directed by your caregiver.  Use a warm mist humidifier or inhale steam from a shower to increase air moisture. This may keep secretions moist and make it easier to breathe.  Drink enough water and fluids to keep your urine clear or pale yellow.  Rest as needed.  Return to work when your temperature has returned to normal or as your caregiver advises. You may need to stay home longer to avoid infecting others. You can also use a face mask and careful hand washing to prevent spread of the virus. SEEK MEDICAL CARE IF:   After the first few days, you feel you are getting worse rather than better.  You need your caregiver's advice about medicines to control symptoms.  You develop chills, worsening shortness of breath, or brown or red sputum. These may be signs of pneumonia.  You develop yellow or brown nasal discharge or pain in the face, especially when you bend forward. These may be signs of sinusitis.  You develop a fever, swollen neck glands, pain with swallowing, or white areas in the back of your throat. These may be signs of strep throat. SEEK IMMEDIATE MEDICAL CARE IF:   You have a fever.  You develop severe or persistent headache, ear pain, sinus pain, or chest pain.  You develop wheezing, a prolonged cough, cough up blood, or have a change in your usual mucus (if you have chronic lung disease).  You develop sore muscles or a stiff neck. Document Released: 07/01/2000 Document Revised: 03/30/2011 Document Reviewed: 04/12/2013 [employer] Patient Information 2015 [place], [place]. This information is not intended to replace advice given to you by your health care provider. Make sure you discuss any questions you have with your health care provider.

## 2014-02-13 NOTE — Progress Notes (Signed)
Patient ID: Matthew [name], male   DOB: 19-Feb-1945, 69 y.o.   MRN: [id number]   Matthew [name]  [id number] [birth year] 02/13/2014      Progress Note-Follow Up  Subjective  Chief Complaint  Chief Complaint  Patient presents with  . Follow-up    6 mos    HPI  Patient is a 69 y.o. male in today for routine medical care. Patient in for follow up and had been doing well til this week when he developed increased congestion, cough, malaise, notes head and chest congestion. Has been taking Nyquil and Alka Seltzer with partial relief. Denies CP/palp/SOB/fevers/GI or GU c/o. Taking meds as prescribed  Past Medical History  Diagnosis Date  . Chicken pox as a child  . Stroke 2007  . Fall     every since his stroke  . Shingles 69 yrs old  . Diabetes mellitus without complication   . Other and unspecified hyperlipidemia 05/11/2013  . Poor dental hygiene 05/11/2013  . Carotid artery disease 05/11/2013  . Measles   . Sleep apnea     [facility]  . Peripheral neuropathy   . Vocal cord dysfunction   . Allergy   . GERD (gastroesophageal reflux disease)     Past Surgical History  Procedure Laterality Date  . Eye surgery  2010    cataracts bilateral  . Appendectomy  2000    ruptured    Family History  Problem Relation Age of Onset  . Cancer Mother     kidney  . Heart attack Father   . Stroke Brother   . Heart attack Brother   . Stroke Brother     History   Social History  . Marital Status: Divorced    Spouse Name: N/A    Number of Children: N/A  . Years of Education: N/A   Occupational History  . Not on file.   Social History Main Topics  . Smoking status: Former Smoker -- 10 years    Types: Cigars    Start date: 11/27/1997  . Smokeless tobacco: Never Used  . Alcohol Use: No     Comment: drank a fifth of bourbon everyday in the 90's  . Drug Use: No  . Sexual Activity: No     Comment: lives with roommate, minimal carbs   Other Topics Concern  . Not on  file   Social History Narrative    Current Outpatient Prescriptions on File Prior to Visit  Medication Sig Dispense Refill  . fluticasone (FLONASE) 50 MCG/ACT nasal spray Place 2 sprays into both nostrils daily. 16 g 6  . insulin glargine (LANTUS) 100 UNIT/ML injection Inject 60 Units into the skin at bedtime.    Marland Kitchen ipratropium (ATROVENT) 0.03 % nasal spray Place 2 sprays into both nostrils every 12 (twelve) hours. 30 mL 12   No current facility-administered medications on file prior to visit.    Allergies  Allergen Reactions  . Aggrenox [Aspirin-Dipyridamole Er] Shortness Of Breath    Review of Systems  Review of Systems  Constitutional: Positive for malaise/fatigue. Negative for fever.  HENT: Positive for congestion and sore throat. Negative for ear pain.   Eyes: Negative for discharge.  Respiratory: Positive for cough and sputum production. Negative for shortness of breath.   Cardiovascular: Negative for chest pain, palpitations and leg swelling.  Gastrointestinal: Negative for nausea, abdominal pain and diarrhea.  Genitourinary: Negative for dysuria.  Musculoskeletal: Positive for myalgias. Negative for falls.  Skin: Negative for rash.  Neurological: Negative for loss of consciousness and headaches.  Endo/Heme/Allergies: Negative for polydipsia.  Psychiatric/Behavioral: Negative for depression and suicidal ideas. The patient is not nervous/anxious and does not have insomnia.     Objective  BP 143/65 mmHg  Pulse 64  Temp(Src) 98.2 F (36.8 C) (Oral)  Ht 6\' 2"  (1.88 m)  Wt 228 lb 6.4 oz (103.602 kg)  BMI 29.31 kg/m2  SpO2 98%  Physical Exam  Physical Exam  Constitutional: He is oriented to person, place, and time and well-developed, well-nourished, and in no distress. No distress.  HENT:  Head: Normocephalic and atraumatic.  Eyes: Conjunctivae are normal.  Neck: Neck supple. No thyromegaly present.  Cardiovascular: Normal rate, regular rhythm and normal heart  sounds.   No murmur heard. Pulmonary/Chest: Effort normal and breath sounds normal. No respiratory distress.  Abdominal: He exhibits no distension and no mass. There is no tenderness.  Musculoskeletal: He exhibits no edema.  Neurological: He is alert and oriented to person, place, and time.  Skin: Skin is warm.  Psychiatric: Memory, affect and judgment normal.    No results found for: TSH No results found for: WBC, HGB, HCT, MCV, PLT No results found for: CREATININE, BUN, NA, K, CL, CO2 No results found for: ALT, AST, GGT, ALKPHOS, BILITOT Lab Results  Component Value Date   CHOL 161 07/31/2013   Lab Results  Component Value Date   HDL 25* 07/31/2013   Lab Results  Component Value Date   LDLCALC 92 07/31/2013   Lab Results  Component Value Date   TRIG 219* 07/31/2013   Lab Results  Component Value Date   CHOLHDL 6.4 07/31/2013     Assessment & Plan  GERD (gastroesophageal reflux disease) Avoid offending foods, start probiotics. Do not eat large meals in late evening and consider raising head of bed.    Diabetes mellitus without complication [id number] acceptable, minimize simple carbs. Increase exercise as tolerated. Continue current meds   Hyperlipidemia, mixed Encouraged heart healthy diet, increase exercise, avoid trans fats, consider a krill oil cap daily   Acute upper respiratory infection Encouraged increased rest and hydration, add probiotics, zinc such as Coldeze or Xicam. Treat fevers as needed. Avoid Alka seltzer and anything with sudafed, this is likely what has put the bp up. Encouraged Mucinex bid

## 2014-02-13 NOTE — Progress Notes (Signed)
Pre visit review using our clinic review tool, if applicable. No additional management support is needed unless otherwise documented below in the visit note. 

## 2014-02-18 ENCOUNTER — Encounter: Payer: Self-pay | Admitting: Family Medicine

## 2014-02-18 DIAGNOSIS — J069 Acute upper respiratory infection, unspecified: Secondary | ICD-10-CM

## 2014-02-18 HISTORY — DX: Acute upper respiratory infection, unspecified: J06.9

## 2014-02-18 NOTE — Assessment & Plan Note (Signed)
hgba1c acceptable, minimize simple carbs. Increase exercise as tolerated. Continue current meds 

## 2014-02-18 NOTE — Assessment & Plan Note (Signed)
Encouraged heart healthy diet, increase exercise, avoid trans fats, consider a krill oil cap daily 

## 2014-02-18 NOTE — Assessment & Plan Note (Signed)
Avoid offending foods, start probiotics. Do not eat large meals in late evening and consider raising head of bed.  

## 2014-02-18 NOTE — Assessment & Plan Note (Signed)
Encouraged increased rest and hydration, add probiotics, zinc such as Coldeze or Xicam. Treat fevers as needed. Avoid Alka seltzer and anything with sudafed, this is likely what has put the bp up. Encouraged Mucinex bid

## 2014-02-19 ENCOUNTER — Telehealth: Payer: Self-pay | Admitting: Family Medicine

## 2014-02-19 MED ORDER — CEFDINIR 300 MG PO CAPS: ORAL_CAPSULE | ORAL | Status: DC

## 2014-02-19 NOTE — Telephone Encounter (Signed)
Called and spoke with the pt and informed him to discontinue the Amoxil and Dr. Charlett Blake would like to sent in Cefdinir 300mg  1 tablet bid x 10 days.  Pt understood and agreed.  New prescription sent to the pharmacy by e-script.//AB/CMA

## 2014-02-19 NOTE — Telephone Encounter (Signed)
Got amoxicillan last week and it is not helping at all.  He needs something different

## 2014-02-28 ENCOUNTER — Ambulatory Visit (INDEPENDENT_AMBULATORY_CARE_PROVIDER_SITE_OTHER): Payer: Medicare Other | Admitting: Medical

## 2014-02-28 ENCOUNTER — Encounter: Payer: Self-pay | Admitting: Medical

## 2014-02-28 VITALS — BP 132/67 | HR 74 | Temp 98.3°F | Ht 74.0 in | Wt 227.6 lb

## 2014-02-28 DIAGNOSIS — R05 Cough: Secondary | ICD-10-CM

## 2014-02-28 DIAGNOSIS — R82998 Other abnormal findings in urine: Secondary | ICD-10-CM

## 2014-02-28 DIAGNOSIS — J020 Streptococcal pharyngitis: Secondary | ICD-10-CM

## 2014-02-28 DIAGNOSIS — N481 Balanitis: Secondary | ICD-10-CM | POA: Diagnosis not present

## 2014-02-28 DIAGNOSIS — R300 Dysuria: Secondary | ICD-10-CM | POA: Diagnosis not present

## 2014-02-28 DIAGNOSIS — R5383 Other fatigue: Secondary | ICD-10-CM | POA: Diagnosis not present

## 2014-02-28 DIAGNOSIS — R52 Pain, unspecified: Secondary | ICD-10-CM | POA: Diagnosis not present

## 2014-02-28 DIAGNOSIS — R059 Cough, unspecified: Secondary | ICD-10-CM | POA: Insufficient documentation

## 2014-02-28 DIAGNOSIS — R350 Frequency of micturition: Secondary | ICD-10-CM | POA: Diagnosis not present

## 2014-02-28 DIAGNOSIS — J029 Acute pharyngitis, unspecified: Secondary | ICD-10-CM | POA: Insufficient documentation

## 2014-02-28 DIAGNOSIS — N390 Urinary tract infection, site not specified: Secondary | ICD-10-CM | POA: Diagnosis not present

## 2014-02-28 DIAGNOSIS — R6883 Chills (without fever): Secondary | ICD-10-CM

## 2014-02-28 LAB — COMPREHENSIVE METABOLIC PANEL
ALT: 19 U/L (ref 0–53)
AST: 23 U/L (ref 0–37)
Albumin: 4.6 g/dL (ref 3.5–5.2)
Alkaline Phosphatase: 78 U/L (ref 39–117)
BILIRUBIN TOTAL: 0.7 mg/dL (ref 0.2–1.2)
BUN: 14 mg/dL (ref 6–23)
CALCIUM: 9.5 mg/dL (ref 8.4–10.5)
CHLORIDE: 104 meq/L (ref 96–112)
CO2: 30 meq/L (ref 19–32)
Creatinine, Ser: 1.18 mg/dL (ref 0.40–1.50)
GFR: 65.27 mL/min (ref >60.00)
GLUCOSE: 95 mg/dL (ref 70–99)
Potassium: 4.3 mEq/L (ref 3.5–5.1)
SODIUM: 139 meq/L (ref 135–145)
TOTAL PROTEIN: 7.3 g/dL (ref 6.0–8.3)

## 2014-02-28 LAB — POCT INFLUENZA A/B
INFLUENZA A, POC: NEGATIVE
Influenza B, POC: NEGATIVE

## 2014-02-28 LAB — CBC WITH DIFFERENTIAL/PLATELET
Basophils Absolute: 0 10*3/uL (ref 0.0–0.1)
Basophils Relative: 0.5 % (ref 0.0–3.0)
EOS ABS: 0.1 10*3/uL (ref 0.0–0.7)
EOS PCT: 1.6 % (ref 0.0–5.0)
HEMATOCRIT: 45.5 % (ref 39.0–52.0)
HEMOGLOBIN: 15.8 g/dL (ref 13.0–17.0)
LYMPHS PCT: 21.6 % (ref 12.0–46.0)
Lymphs Abs: 1.6 10*3/uL (ref 0.7–4.0)
MCHC: 34.6 g/dL (ref 30.0–36.0)
MCV: 92.2 fl (ref 78.0–100.0)
MONO ABS: 0.6 10*3/uL (ref 0.1–1.0)
MONOS PCT: 8 % (ref 3.0–12.0)
Neutro Abs: 5.2 10*3/uL (ref 1.4–7.7)
Neutrophils Relative %: 68.3 % (ref 43.0–77.0)
PLATELETS: 211 10*3/uL (ref 150.0–400.0)
RBC: 4.93 Mil/uL (ref 4.22–5.81)
RDW: 14.6 % (ref 11.5–15.5)
WBC: 7.6 10*3/uL (ref 4.0–10.5)

## 2014-02-28 LAB — PSA, MEDICARE: PSA: 3.99 ng/mL (ref 0.10–4.00)

## 2014-02-28 LAB — POCT RAPID STREP A (OFFICE): Rapid Strep A Screen: POSITIVE — AB

## 2014-02-28 MED ORDER — CEPHALEXIN 500 MG PO CAPS: 500.0000 mg | ORAL_CAPSULE | Freq: Three times a day (TID) | ORAL | Status: DC

## 2014-02-28 MED ORDER — FLUCONAZOLE 150 MG PO TABS: ORAL_TABLET | ORAL | Status: DC

## 2014-02-28 MED ORDER — BENZONATATE 100 MG PO CAPS: 100.0000 mg | ORAL_CAPSULE | Freq: Three times a day (TID) | ORAL | Status: DC | PRN

## 2014-02-28 NOTE — Progress Notes (Signed)
Pre visit review using our clinic review tool, if applicable. No additional management support is needed unless otherwise documented below in the visit note. 

## 2014-02-28 NOTE — Assessment & Plan Note (Signed)
rx diflucan for 3 days. Since also will be on keflex for strep.

## 2014-02-28 NOTE — Progress Notes (Signed)
Subjective:    Patient ID: Matthew [name], male    DOB: 1945-08-29, 69 y.o.   MRN: [id number]  HPI   Pt in with one month of symptoms on and off.    He states some chills on and off for one month. Pt has been visiting his friends in hospital.  Pt had occasional cough and feel mild congested. Today he has mild sorethroat.  Bodyaches some. Chills and fever, sweats past 2 nights.  Some pain urinating for one week. Pt has some bph. He uses Tamsulosin.  Also pain medial to lt scapula comes and goes lasting one minute and subsides. Not associated with any chest pain. No arm pain. Pain last one minute then subsides. Pain very mild. Then goes away.    Review of Systems  Constitutional: Positive for fever, chills, diaphoresis and fatigue.       On and off symptoms daily basis.  HENT: Positive for sore throat.   Respiratory: Positive for cough. Negative for apnea, chest tightness, shortness of breath, wheezing and stridor.   Cardiovascular: Negative for chest pain and palpitations.  Gastrointestinal: Negative for nausea, vomiting, abdominal pain, diarrhea, constipation and blood in stool.  Genitourinary: Positive for dysuria. Negative for hematuria, flank pain, decreased urine volume, discharge, penile swelling, scrotal swelling, difficulty urinating, genital sores, penile pain and testicular pain.       X 1 day.  Musculoskeletal: Positive for myalgias and back pain.  Neurological: Negative for dizziness, tremors, seizures, syncope, weakness, numbness and headaches.  Hematological: Negative for adenopathy. Does not bruise/bleed easily.  Psychiatric/Behavioral: Negative for behavioral problems, confusion, self-injury and dysphoric mood. The patient is not nervous/anxious.    . Past Medical History  Diagnosis Date  . Chicken pox as a child  . Stroke 2007  . Fall     every since his stroke  . Shingles 69 yrs old  . Diabetes mellitus without complication   . Other and unspecified  hyperlipidemia 05/11/2013  . Poor dental hygiene 05/11/2013  . Carotid artery disease 05/11/2013  . Measles   . Sleep apnea     [facility]  . Peripheral neuropathy   . Vocal cord dysfunction   . Allergy   . GERD (gastroesophageal reflux disease)   . Acute upper respiratory infection 02/18/2014    History   Social History  . Marital Status: Divorced    Spouse Name: N/A  . Number of Children: N/A  . Years of Education: N/A   Occupational History  . Not on file.   Social History Main Topics  . Smoking status: Former Smoker -- 10 years    Types: Cigars    Start date: 11/27/1997  . Smokeless tobacco: Never Used  . Alcohol Use: No     Comment: drank a fifth of bourbon everyday in the 90's  . Drug Use: No  . Sexual Activity: No     Comment: lives with roommate, minimal carbs   Other Topics Concern  . Not on file   Social History Narrative    Past Surgical History  Procedure Laterality Date  . Eye surgery  2010    cataracts bilateral  . Appendectomy  2000    ruptured    Family History  Problem Relation Age of Onset  . Cancer Mother     kidney  . Heart attack Father   . Stroke Brother   . Heart attack Brother   . Stroke Brother     Allergies  Allergen  Reactions  . Aggrenox [Aspirin-Dipyridamole Er] Shortness Of Breath    Current Outpatient Prescriptions on File Prior to Visit  Medication Sig Dispense Refill  . amoxicillin (AMOXIL) 500 MG capsule Take 1 capsule (500 mg total) by mouth 3 (three) times daily. 30 capsule 0  . aspirin 81 MG tablet Take 81 mg by mouth daily.    . cefdinir (OMNICEF) 300 MG capsule TAKE 1 CAPSULE BY MOUTH 2 TIMES DAY X 10 DAYS. 20 capsule 0  . fluticasone (FLONASE) 50 MCG/ACT nasal spray Place 2 sprays into both nostrils daily. 16 g 6  . insulin aspart (NOVOLOG) 100 UNIT/ML injection Inject 10 Units into the skin 2 (two) times daily with a meal.    . insulin glargine (LANTUS) 100 UNIT/ML injection Inject 60 Units into  the skin at bedtime.    Marland Kitchen ipratropium (ATROVENT) 0.03 % nasal spray Place 2 sprays into both nostrils every 12 (twelve) hours. 30 mL 12  . tamsulosin (FLOMAX) 0.4 MG CAPS capsule Take 0.4 mg by mouth at bedtime.    . vardenafil (LEVITRA) 10 MG tablet 1/2 to 2 tabs po daily prn ED (Patient not taking: Reported on 02/28/2014) 10 tablet 0   No current facility-administered medications on file prior to visit.    BP 132/67 mmHg  Pulse 74  Temp(Src) 98.3 F (36.8 C) (Oral)  Ht 6\' 2"  (1.88 m)  Wt 227 lb 9.6 oz (103.239 kg)  BMI 29.21 kg/m2  SpO2 96%       Objective:   Physical Exam  General  Mental Status - Alert. General Appearance - Well groomed. Not in acute distress.  Skin Rashes- No Rashes.  HEENT Head- Normal. Ear Auditory Canal - Left- Normal. Right - Normal.Tympanic Membrane- Left- Normal. Right- Normal. Eye Sclera/Conjunctiva- Left- Normal. Right- Normal. Nose & Sinuses Nasal Mucosa- Left-  Mild boggy + Congested. Right-  Mild boggy + Congested. Mouth & Throat Lips: Upper Lip- Normal: no dryness, cracking, pallor, cyanosis, or vesicular eruption. Lower Lip-Normal: no dryness, cracking, pallor, cyanosis or vesicular eruption. Buccal Mucosa- Bilateral- No Aphthous ulcers. Oropharynx- No Discharge or Erythema. Tonsils: Characteristics- Bilateral-  Erythema +  Congestion. Size/Enlargement- Bilateral- 2 + enlargement. Discharge- bilateral-None.  Neck Neck- Supple. No Masses.   Chest and Lung Exam Auscultation: Breath Sounds:- even and unlabored, but bilateral upper lobe rhonchi.  Cardiovascular Auscultation:Rythm- Regular, rate and rhythm. Murmurs & Other Heart Sounds:Ausculatation of the heart reveal- No Murmurs.  Lymphatic Head & Neck General Head & Neck Lymphatics: Bilateral: Description- No Localized lymphadenopathy.   Abdomen Inspection:-Inspection Normal.  Palpation/Perucssion: Palpation and Percussion of the abdomen reveal- Non Tender, No Rebound  tenderness, No rigidity(Guarding) and No Palpable abdominal masses.  Liver:-Normal.  Spleen:- Normal.   Rectal Anorectal Exam: Stool - Hemoccult of stool/mucous is Heme Negative. External - normal external exam. Internal - normal sphincter tone. No rectal mass. Prostate mild enlarged but no nodules  Penis- uncircumsed. Mild redness to foreskin.  Neuro- CN III- XII grossly intact.       Assessment & Plan:

## 2014-02-28 NOTE — Assessment & Plan Note (Signed)
On and off for month with fever and chills. Will go ahead and get cbc, and cmp.

## 2014-02-28 NOTE — Assessment & Plan Note (Signed)
psa and urine culture will be done.

## 2014-02-28 NOTE — Assessment & Plan Note (Signed)
Mild and will give him benzonatate for cough.

## 2014-02-28 NOTE — Patient Instructions (Signed)
Cough Mild and will give him benzonatate for cough.   Acute pharyngitis Rapid strep positive. Will rx cephalexin. May provide some urinary coverage based on other complaints.  Chills, fever, fatigue body aches likely related to strep.   Frequent urination psa and urine culture will be done.   Balanitis rx diflucan for 3 days. Since also will be on keflex for strep.   Fatigue On and off for month with fever and chills. Will go ahead and get cbc, and cmp.     Follow up in 7 days or as needed.

## 2014-02-28 NOTE — Assessment & Plan Note (Addendum)
Rapid strep positive. Will rx cephalexin. May provide some urinary coverage based on other complaints.  Chills, fever, fatigue body aches likely related to strep.

## 2014-03-01 LAB — URINE CULTURE
COLONY COUNT: NO GROWTH
Organism ID, Bacteria: NO GROWTH

## 2014-03-27 ENCOUNTER — Telehealth: Payer: Self-pay | Admitting: Medical

## 2014-03-27 NOTE — Telephone Encounter (Signed)
I called pt and he updated me on some back pain. Difficult working. He may go to [place]. If not he will call us and we can see him and then refer  PT. Pt will update me.  Also he reports erectile dysfunction. He states viagra and other type meds don't work. I offered urologist. And he states will think about this.

## 2014-04-20 LAB — HM DIABETES EYE EXAM

## 2014-05-12 NOTE — Op Note (Signed)
PATIENT NAME:  Matthew [name], Matthew [name] MR#:  [id number] DATE OF BIRTH:  August 09, 1945  DATE OF PROCEDURE:  09/28/2013  PREOPERATIVE DIAGNOSES:  1.  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  2.  Mild right carotid artery stenosis.  3.  Diabetes.   POSTOPERATIVE DIAGNOSES:  1.  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  2.  Mild right carotid artery stenosis.  3.  Diabetes.   PROCEDURE: Left carotid endarterectomy.   SURGEON: [name], MD   ANESTHESIA: General.   ESTIMATED BLOOD LOSS: 100 mL.  INDICATION FOR PROCEDURE: This is a gentleman, 69 years old, who came to my office with known diagnosis of carotid artery stenosis. He had apparently gone through the [place] system and had some difficulty getting this repaired. He had underwent dental extractions for infected abscessed teeth and had healed from these and was now ready to have his carotid endarterectomy. His imaging studies did in fact show a near occlusive stenosis of the left carotid artery. We discussed options. He had poor anatomy for carotid stenting but carotid endarterectomy was planned. Risks and benefits were discussed. Informed consent was obtained.   DESCRIPTION OF PROCEDURE: The patient was brought to the operative suite, and after an adequate level of general anesthesia was obtained, the patient was placed in modified beach chair position. A roll was placed under shoulders, neck was flexed and turned to the side. His neck was sterilely prepped and draped and a sterile surgical field was created. Incisions created along the anterior border of the sternocleidomastoid and I dissected down through platysma with electrocautery. I then used 2 [place] retractors to facilitate our exposure. The facial vein was ligated and divided between silk ties as was the venous branch. The carotid artery was then dissected out. The common carotid artery, external carotid artery, superior thyroid artery and internal carotid artery and  distal lesion were all dissected out and encircled with vessel loops. He had somewhat low-lying hypoglossal nerve that had to be dissected free and protected from harm and several vascular branches with the hypoglossal artery were ligated and divided between silk ties. The patient was given 7000 units of intravenous heparin and this was allowed to circulate for 5 minutes. Control was then pulled up on the vessel loops and the anterior (Dictation Anomaly) was created with an 11 blade and extended with Potts scissors. The Pruitt-[place] shunt was then placed, first in the internal carotid artery, flushed and de-aired and then the common carotid artery. Approximately 1 minute elapsed between clamping to shunt placement. After shunt was in place, we performed endarterectomy in the typical fashion. An eversion endarterectomy was performed on the external carotid artery. The proximal endpoint was cut flush with tenotomy scissors, and a nice feathered distal endpoint was created distally. Two 7-0 Prolene patch sutures were used to tack down the distal endpoint. All loose flecks were removed and the vessel was locally heparinized. He had a very large generous vessel, so I elected to perform a primary closure. The artery was closed with 6-0 Prolene suture started at the distal endpoint and run one-half the length of the arteriotomy. A second 6-0 Prolene was run starting at the proximal endpoint and run approximately one-quarter the length of the arteriotomy. The shunt was then removed. The suture lines were then brought together and tied. We flushed through the external carotid artery several cardiac cycles prior to release of control. On release, a single 6-0 Prolene patch suture was used for hemostasis and hemostasis was achieved.  The wound was irrigated. Surgicel and Evicel topical hemostatic agents were placed. The wound was closed with 3 interrupted 3-0 Vicryl in the sternocleidomastoid space. The platysma was closed  with a running 3-0 Vicryl and the skin was closed with 4-0 Monocryl. Dermabond and sterile dressing was placed. The patient was awakened from anesthesia and taken to the recovery room in stable condition, having tolerated the procedure well.    ____________________________ Algernon Huxley, MD jsd:TT D: 09/28/2013 14:39:15 ET T: 09/28/2013 15:01:36 ET JOB#: [id number]  cc: Algernon Huxley, MD, <Dictator> Algernon Huxley MD ELECTRONICALLY SIGNED 10/02/2013 15:06

## 2014-08-14 ENCOUNTER — Ambulatory Visit: Payer: Medicare Other | Admitting: Family Medicine

## 2014-08-17 ENCOUNTER — Telehealth: Payer: Self-pay | Admitting: Family Medicine

## 2014-08-17 ENCOUNTER — Encounter: Payer: Self-pay | Admitting: Family Medicine

## 2014-08-17 NOTE — Telephone Encounter (Signed)
OK to charge

## 2014-08-17 NOTE — Telephone Encounter (Signed)
Pt was no show 08/14/14 9:30am, office visit 30 appt, pt has not rescheduled, phone # disconnected, mailing letter, do you want to charge for no show?

## 2014-09-03 ENCOUNTER — Telehealth: Payer: Self-pay | Admitting: Behavioral Health

## 2014-09-03 NOTE — Telephone Encounter (Signed)
Unable to reach patient at time of Pre-Visit Call.  Left message for patient to return call when available.    

## 2014-09-04 ENCOUNTER — Telehealth: Payer: Self-pay

## 2014-09-04 ENCOUNTER — Ambulatory Visit (INDEPENDENT_AMBULATORY_CARE_PROVIDER_SITE_OTHER): Payer: Commercial Managed Care - HMO

## 2014-09-04 VITALS — BP 112/54 | HR 65 | Temp 98.3°F | Ht 73.0 in | Wt 231.6 lb

## 2014-09-04 DIAGNOSIS — Z0000 Encounter for general adult medical examination without abnormal findings: Secondary | ICD-10-CM | POA: Diagnosis not present

## 2014-09-04 NOTE — Progress Notes (Signed)
Reviewed annual wellness note

## 2014-09-04 NOTE — Telephone Encounter (Signed)
Unable to reach patient. Phone message states pt is not accepting calls at this time.

## 2014-09-04 NOTE — Progress Notes (Signed)
Pre visit review using our clinic review tool, if applicable. No additional management support is needed unless otherwise documented below in the visit note. 

## 2014-09-04 NOTE — Telephone Encounter (Signed)
So if he is not responding to the traditional meds I usually refer to urology for further consideration. They offer pumps, implants etc which might work better for him.

## 2014-09-04 NOTE — Progress Notes (Addendum)
Subjective:   Matthew [name] is a 69 y.o. male who presents for an Initial Medicare Annual Wellness Visit.  Review of Systems:  No ROS  Sleep patterns:   Sleeps approximately 5 hours per night. Takes naps during the day. Wakes up at least twice to go to the bathroom. Home Safety/Smoke Alarms:  Feels safe at home.  Currently lives at a veterans home.  Lives with one other veteran.   Smoke detectors present.   Firearm Safety:  No firearms.   Seat Belt Safety/Bike Helmet:  Always wears seat belt.    Counseling:   Eye Exam- pt reported last eye exam-04/2014  Dental- Has had all teeth removed.    Male:  [id number]? In [place], [place]   PSA- 07/2013--3.99  PNA vaccine:  Declined Sun exposure:  Discussed/ Pt education provided.   Diabetic foot care: Discussed/Pt education provided.      Objective:    Today's Vitals   09/04/14 0920  BP: 112/54  Pulse: 65  Temp: 98.3 F (36.8 C)  TempSrc: Oral  Height: 6\' 1"  (1.854 m)  Weight: 231 lb 9.6 oz (105.053 kg)  SpO2: 97%  PainSc: 0-No pain    Current Medications (verified) Outpatient Encounter Prescriptions as of 09/04/2014  Medication Sig  . aspirin 81 MG tablet Take 81 mg by mouth daily.  Marland Kitchen ibuprofen (ADVIL,MOTRIN) 800 MG tablet Take 800 mg by mouth 2 (two) times daily as needed.  . insulin glargine (LANTUS) 100 UNIT/ML injection Inject 100 Units into the skin daily.   Marland Kitchen ipratropium (ATROVENT) 0.03 % nasal spray Place 2 sprays into both nostrils every 12 (twelve) hours.  . Probiotic Product (PROBIOTIC DAILY PO) Take 1 tablet by mouth daily.  . insulin aspart (NOVOLOG) 100 UNIT/ML injection Inject 10 Units into the skin 2 (two) times daily with a meal.  . tamsulosin (FLOMAX) 0.4 MG CAPS capsule Take 0.4 mg by mouth at bedtime.  . vardenafil (LEVITRA) 10 MG tablet 1/2 to 2 tabs po daily prn ED (Patient not taking: Reported on 02/28/2014)  . [DISCONTINUED] amoxicillin (AMOXIL) 500 MG capsule Take 1 capsule (500 mg total) by mouth 3 (three) times  daily.  . [DISCONTINUED] benzonatate (TESSALON) 100 MG capsule Take 1 capsule (100 mg total) by mouth 3 (three) times daily as needed for cough.  . [DISCONTINUED] cefdinir (OMNICEF) 300 MG capsule TAKE 1 CAPSULE BY MOUTH 2 TIMES DAY X 10 DAYS.  . [DISCONTINUED] cephALEXin (KEFLEX) 500 MG capsule Take 1 capsule (500 mg total) by mouth 3 (three) times daily.  . [DISCONTINUED] fluconazole (DIFLUCAN) 150 MG tablet 1 tab po q day for 3 days.  . [DISCONTINUED] fluticasone (FLONASE) 50 MCG/ACT nasal spray Place 2 sprays into both nostrils daily.   No facility-administered encounter medications on file as of 09/04/2014.    Allergies (verified) Aggrenox   History: Past Medical History  Diagnosis Date  . Chicken pox as a child  . Stroke 2007  . Fall     every since his stroke  . Shingles 69 yrs old  . Diabetes mellitus without complication   . Other and unspecified hyperlipidemia 05/11/2013  . Poor dental hygiene 05/11/2013  . Carotid artery disease 05/11/2013  . Measles   . Sleep apnea     uses CPAP, [facility]  . Peripheral neuropathy   . Vocal cord dysfunction   . Allergy   . GERD (gastroesophageal reflux disease)   . Acute upper respiratory infection 02/18/2014   Past Surgical History  Procedure Laterality Date  . Eye surgery  2010    cataracts bilateral  . Appendectomy  2000    ruptured  . Carotid endarterectomy  09/2010   Family History  Problem Relation Age of Onset  . Cancer Mother     kidney  . Heart attack Father   . Stroke Brother   . Heart attack Brother   . Stroke Brother    Social History   Occupational History  . Veteran    Social History Main Topics  . Smoking status: Former Smoker -- 10 years    Types: Cigars    Start date: 11/27/1997  . Smokeless tobacco: Never Used  . Alcohol Use: 0.0 oz/week    0 Standard drinks or equivalent per week     Comment: occasionally drinks wine   . Drug Use: No  . Sexual Activity:    Partners: Female     Comment:  lives with roommate, minimal carbs   Tobacco Counseling Counseling given: Not Answered   Activities of Daily Living In your present state of health, do you have any difficulty performing the following activities: 09/04/2014 02/13/2014  Hearing? Y N  Vision? N N  Difficulty concentrating or making decisions? N N  Walking or climbing stairs? Y N  Dressing or bathing? N N  Doing errands, shopping? N N  Preparing Food and eating ? N -  Using the Toilet? N -  In the past six months, have you accidently leaked urine? N -  Do you have problems with loss of bowel control? N -  Managing your Medications? N -  Managing your Finances? N -  Housekeeping or managing your Housekeeping? N -    Immunizations and Health Maintenance  There is no immunization history on file for this patient.   Health Maintenance Due  Topic Date Due  . Hepatitis C Screening  13-Oct-1945  . URINE MICROALBUMIN  08/14/2014    Patient Care Team: Mosie Lukes, MD as PCP - General (Family Medicine) Bernell List, MD as Referring Physician (Internal Medicine)  Dr. Maximino Green[facility] See a dermatologist.  Marena Chancy of provider's name.     Indicate any recent Medical Services you may have received from other than [facility] providers in the past year (date may be approximate).    Assessment:   This is a routine wellness examination for Jude.  Diabetes, Type 2- checks blood sugars daily and as needed.  Counts carbs. Taking Lantus daily.  Pt encouraged to continue to avoid simple carbs and increase physical activity.     Hyperlipidemia- Not currently on medication.  Managing with diet.  Encouraged to avoid sugary foods and foods high in saturated fats. Encouraged to increase physical activity.     BMI-30.56- Discussed the importance of diet and exercise.    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    Vision Screening Comments: Wear glasses.  No changes in vision.    Dietary issues and exercise activities discussed: Current Exercise Habits:: The patient has a physically strenous job, but has no regular exercise apart from work.   Diet (meal preparation, eat out, water intake, caffeinated beverages, dairy products, fruits and vegetables):  Does eat some fried foods/country cooking, counts carbs, eat veggies/little  fruit.    Goals    . Increase physical activity     Wants to achieve goal by walking.       . Reduce portion size      Depression Screen PHQ 2/9 Scores 09/04/2014 02/13/2014  PHQ - 2 Score 0 0    Fall Risk Fall Risk  09/04/2014 02/13/2014  Falls in the past year? No No    Cognitive Function: MMSE - Mini Mental State Exam 09/04/2014  Orientation to time 5  Orientation to Place 4  Registration 3  Attention/ Calculation 5  Recall 2  Language- name 2 objects 2  Language- repeat 1  Language- follow 3 step command 3  Language- read & follow direction 1  Write a sentence 1  Copy design 1  Total score 28    Screening Tests Health Maintenance  Topic Date Due  . Hepatitis C Screening  07/02/1945  . URINE MICROALBUMIN  08/14/2014  . HEMOGLOBIN A1C  12/19/2014 (Originally 01/31/2014)  . PNA vac Low Risk Adult (1 of 2 - PCV13) 12/19/2014 (Originally 04/04/2011)  . INFLUENZA VACCINE  02/14/2015 (Originally 08/20/2014)  . ZOSTAVAX  02/14/2015 (Originally 04/04/2006)  . TETANUS/TDAP  02/14/2015 (Originally 04/03/1965)  . FOOT EXAM  01/03/2015  . OPHTHALMOLOGY EXAM  04/20/2015  . COLONOSCOPY  01/19/2017      Plan:  Encouraged to increase physical activity and monitor portion sizes.  Fax advanced directives information, immunization and colonoscopy records to office.  [facility] and [facility] as needed.   During the course of the visit Muhammadali was educated and counseled about the following appropriate  screening and preventive services:   Vaccines to include Pneumoccal, Influenza, Hepatitis B, Td, Zostavax, HCV  Electrocardiogram  Colorectal cancer screening  Cardiovascular disease screening  Diabetes screening  Glaucoma screening  Nutrition counseling  Prostate cancer screening  Smoking cessation counseling  Patient Instructions (the written plan) were given to the patient.   Rudene Anda, RN   09/04/2014

## 2014-09-04 NOTE — Patient Instructions (Addendum)
Increase your physical activity (through walking) and monitor portions sizes.    Fax advanced directives information, immunization and colonoscopy records to office.  [facility] and [facility] as needed.  Fat and Cholesterol Control Diet Fat and cholesterol levels in your blood and organs are influenced by your diet. High levels of fat and cholesterol may lead to diseases of the heart, small and large blood vessels, gallbladder, liver, and pancreas. CONTROLLING FAT AND CHOLESTEROL WITH DIET Although exercise and lifestyle factors are important, your diet is key. That is because certain foods are known to raise cholesterol and others to lower it. The goal is to balance foods for their effect on cholesterol and more importantly, to replace saturated and trans fat with other types of fat, such as monounsaturated fat, polyunsaturated fat, and omega-3 fatty acids. On average, a person should consume no more than 15 to 17 g of saturated fat daily. Saturated and trans fats are considered "bad" fats, and they will raise LDL cholesterol. Saturated fats are primarily found in animal products such as meats, butter, and cream. However, that does not mean you need to give up all your favorite foods. Today, there are good tasting, low-fat, low-cholesterol substitutes for most of the things you like to eat. Choose low-fat or nonfat alternatives. Choose round or loin cuts of red meat. These types of cuts are lowest in fat and cholesterol. Chicken (without the skin), fish, veal, and ground [place] breast are great choices. Eliminate fatty meats, such as hot dogs and salami. Even shellfish have little or no saturated fat. Have a 3 oz (85 g) portion when you eat lean meat, poultry, or fish. Trans fats are also called "partially hydrogenated oils." They are oils that have been scientifically manipulated so that they are solid at room temperature resulting in a longer shelf life and improved taste and texture of foods in which  they are added. Trans fats are found in stick margarine, some tub margarines, cookies, crackers, and baked goods.  When baking and cooking, oils are a great substitute for butter. The monounsaturated oils are especially beneficial since it is believed they lower LDL and raise HDL. The oils you should avoid entirely are saturated tropical oils, such as coconut and palm.  Remember to eat a lot from food groups that are naturally free of saturated and trans fat, including fish, fruit, vegetables, beans, grains (barley, rice, couscous, bulgur wheat), and pasta (without cream sauces).  IDENTIFYING FOODS THAT LOWER FAT AND CHOLESTEROL  Soluble fiber may lower your cholesterol. This type of fiber is found in fruits such as apples, vegetables such as broccoli, potatoes, and carrots, legumes such as beans, peas, and lentils, and grains such as barley. Foods fortified with plant sterols (phytosterol) may also lower cholesterol. You should eat at least 2 g per day of these foods for a cholesterol lowering effect.  Read package labels to identify low-saturated fats, trans fat free, and low-fat foods at the supermarket. Select cheeses that have only 2 to 3 g saturated fat per ounce. Use a heart-healthy tub margarine that is free of trans fats or partially hydrogenated oil. When buying baked goods (cookies, crackers), avoid partially hydrogenated oils. Breads and muffins should be made from whole grains (whole-wheat or whole oat flour, instead of "flour" or "enriched flour"). Buy non-creamy canned soups with reduced salt and no added fats.  FOOD PREPARATION TECHNIQUES  Never deep-fry. If you must fry, either stir-fry, which uses very little fat, or use non-stick cooking  sprays. When possible, broil, bake, or roast meats, and steam vegetables. Instead of putting butter or margarine on vegetables, use lemon and herbs, applesauce, and cinnamon (for squash and sweet potatoes). Use nonfat yogurt, salsa, and low-fat dressings  for salads.  LOW-SATURATED FAT / LOW-FAT FOOD SUBSTITUTES Meats / Saturated Fat (g)  Avoid: Steak, marbled (3 oz/85 g) / 11 g  Choose: Steak, lean (3 oz/85 g) / 4 g  Avoid: Hamburger (3 oz/85 g) / 7 g  Choose: Hamburger, lean (3 oz/85 g) / 5 g  Avoid: Ham (3 oz/85 g) / 6 g  Choose: Ham, lean cut (3 oz/85 g) / 2.4 g  Avoid: Chicken, with skin, dark meat (3 oz/85 g) / 4 g  Choose: Chicken, skin removed, dark meat (3 oz/85 g) / 2 g  Avoid: Chicken, with skin, light meat (3 oz/85 g) / 2.5 g  Choose: Chicken, skin removed, light meat (3 oz/85 g) / 1 g Dairy / Saturated Fat (g)  Avoid: Whole milk (1 cup) / 5 g  Choose: Low-fat milk, 2% (1 cup) / 3 g  Choose: Low-fat milk, 1% (1 cup) / 1.5 g  Choose: Skim milk (1 cup) / 0.3 g  Avoid: Hard cheese (1 oz/28 g) / 6 g  Choose: Skim milk cheese (1 oz/28 g) / 2 to 3 g  Avoid: Cottage cheese, 4% fat (1 cup) / 6.5 g  Choose: Low-fat cottage cheese, 1% fat (1 cup) / 1.5 g  Avoid: Ice cream (1 cup) / 9 g  Choose: Sherbet (1 cup) / 2.5 g  Choose: Nonfat frozen yogurt (1 cup) / 0.3 g  Choose: Frozen fruit bar / trace  Avoid: Whipped cream (1 tbs) / 3.5 g  Choose: Nondairy whipped topping (1 tbs) / 1 g Condiments / Saturated Fat (g)  Avoid: Mayonnaise (1 tbs) / 2 g  Choose: Low-fat mayonnaise (1 tbs) / 1 g  Avoid: Butter (1 tbs) / 7 g  Choose: Extra light margarine (1 tbs) / 1 g  Avoid: Coconut oil (1 tbs) / 11.8 g  Choose: Olive oil (1 tbs) / 1.8 g  Choose: Corn oil (1 tbs) / 1.7 g  Choose: Safflower oil (1 tbs) / 1.2 g  Choose: Sunflower oil (1 tbs) / 1.4 g  Choose: Soybean oil (1 tbs) / 2.4 g  Choose: Canola oil (1 tbs) / 1 g Document Released: 01/05/2005 Document Revised: 05/02/2012 Document Reviewed: 04/05/2013 [employer] Patient Information 2015 [place], [place]. This information is not intended to replace advice given to you by your health care provider. Make sure you discuss any questions you have with your  health care provider.  Diabetes and Foot Care Diabetes may cause you to have problems because of poor blood supply (circulation) to your feet and legs. This may cause the skin on your feet to become thinner, break easier, and heal more slowly. Your skin may become dry, and the skin may peel and crack. You may also have nerve damage in your legs and feet causing decreased feeling in them. You may not notice minor injuries to your feet that could lead to infections or more serious problems. Taking care of your feet is one of the most important things you can do for yourself.  HOME CARE INSTRUCTIONS  Wear shoes at all times, even in the house. Do not go barefoot. Bare feet are easily injured.  Check your feet daily for blisters, cuts, and redness. If you cannot see the bottom of your feet, use  a mirror or ask someone for help.  Wash your feet with warm water (do not use hot water) and mild soap. Then pat your feet and the areas between your toes until they are completely dry. Do not soak your feet as this can dry your skin.  Apply a moisturizing lotion or petroleum jelly (that does not contain alcohol and is unscented) to the skin on your feet and to dry, brittle toenails. Do not apply lotion between your toes.  Trim your toenails straight across. Do not dig under them or around the cuticle. File the edges of your nails with an emery board or nail file.  Do not cut corns or calluses or try to remove them with medicine.  Wear clean socks or stockings every day. Make sure they are not too tight. Do not wear knee-high stockings since they may decrease blood flow to your legs.  Wear shoes that fit properly and have enough cushioning. To break in new shoes, wear them for just a few hours a day. This prevents you from injuring your feet. Always look in your shoes before you put them on to be sure there are no objects inside.  Do not cross your legs. This may decrease the blood flow to your feet.  If  you find a minor scrape, cut, or break in the skin on your feet, keep it and the skin around it clean and dry. These areas may be cleansed with mild soap and water. Do not cleanse the area with peroxide, alcohol, or iodine.  When you remove an adhesive bandage, be sure not to damage the skin around it.  If you have a wound, look at it several times a day to make sure it is healing.  Do not use heating pads or hot water bottles. They may burn your skin. If you have lost feeling in your feet or legs, you may not know it is happening until it is too late.  Make sure your health care provider performs a complete foot exam at least annually or more often if you have foot problems. Report any cuts, sores, or bruises to your health care provider immediately. SEEK MEDICAL CARE IF:   You have an injury that is not healing.  You have cuts or breaks in the skin.  You have an ingrown nail.  You notice redness on your legs or feet.  You feel burning or tingling in your legs or feet.  You have pain or cramps in your legs and feet.  Your legs or feet are numb.  Your feet always feel cold. SEEK IMMEDIATE MEDICAL CARE IF:   There is increasing redness, swelling, or pain in or around a wound.  There is a red line that goes up your leg.  Pus is coming from a wound.  You develop a fever or as directed by your health care provider.  You notice a bad smell coming from an ulcer or wound. Document Released: 01/03/2000 Document Revised: 09/07/2012 Document Reviewed: 06/14/2012 [facility] Patient Information 2015 [place], [place]. This information is not intended to replace advice given to you by your health care provider. Make sure you discuss any questions you have with your health care provider.  Health Maintenance A healthy lifestyle and preventative care can promote health and wellness.  Maintain regular health, dental, and eye exams.  Eat a healthy diet. Foods like vegetables, fruits, whole  grains, low-fat dairy products, and lean protein foods contain the nutrients you need and are low in  calories. Decrease your intake of foods high in solid fats, added sugars, and salt. Get information about a proper diet from your health care provider, if necessary.  Regular physical exercise is one of the most important things you can do for your health. Most adults should get at least 150 minutes of moderate-intensity exercise (any activity that increases your heart rate and causes you to sweat) each week. In addition, most adults need muscle-strengthening exercises on 2 or more days a week.   Maintain a healthy weight. The body mass index (BMI) is a screening tool to identify possible weight problems. It provides an estimate of body fat based on height and weight. Your health care provider can find your BMI and can help you achieve or maintain a healthy weight. For males 20 years and older:  A BMI below 18.5 is considered underweight.  A BMI of 18.5 to 24.9 is normal.  A BMI of 25 to 29.9 is considered overweight.  A BMI of 30 and above is considered obese.  Maintain normal blood lipids and cholesterol by exercising and minimizing your intake of saturated fat. Eat a balanced diet with plenty of fruits and vegetables. Blood tests for lipids and cholesterol should begin at age 14 and be repeated every 5 years. If your lipid or cholesterol levels are high, you are over age 6, or you are at high risk for heart disease, you may need your cholesterol levels checked more frequently.Ongoing high lipid and cholesterol levels should be treated with medicines if diet and exercise are not working.  If you smoke, find out from your health care provider how to quit. If you do not use tobacco, do not start.  Lung cancer screening is recommended for adults aged 48-80 years who are at high risk for developing lung cancer because of a history of smoking. A yearly low-dose CT scan of the lungs is recommended  for people who have at least a 30-pack-year history of smoking and are current smokers or have quit within the past 15 years. A pack year of smoking is smoking an average of 1 pack of cigarettes a day for 1 year (for example, a 30-pack-year history of smoking could mean smoking 1 pack a day for 30 years or 2 packs a day for 15 years). Yearly screening should continue until the smoker has stopped smoking for at least 15 years. Yearly screening should be stopped for people who develop a health problem that would prevent them from having lung cancer treatment.  If you choose to drink alcohol, do not have more than 2 drinks per day. One drink is considered to be 12 oz (360 mL) of beer, 5 oz (150 mL) of wine, or 1.5 oz (45 mL) of liquor.  Avoid the use of street drugs. Do not share needles with anyone. Ask for help if you need support or instructions about stopping the use of drugs.  High blood pressure causes heart disease and increases the risk of stroke. Blood pressure should be checked at least every 1-2 years. Ongoing high blood pressure should be treated with medicines if weight loss and exercise are not effective.  If you are 41-61 years old, ask your health care provider if you should take aspirin to prevent heart disease.  Diabetes screening involves taking a blood sample to check your fasting blood sugar level. This should be done once every 3 years after age 79 if you are at a normal weight and without risk factors for diabetes. Testing  should be considered at a younger age or be carried out more frequently if you are overweight and have at least 1 risk factor for diabetes.  Colorectal cancer can be detected and often prevented. Most routine colorectal cancer screening begins at the age of 49 and continues through age 81. However, your health care provider may recommend screening at an earlier age if you have risk factors for colon cancer. On a yearly basis, your health care provider may provide  home test kits to check for hidden blood in the stool. A small camera at the end of a tube may be used to directly examine the colon (sigmoidoscopy or colonoscopy) to detect the earliest forms of colorectal cancer. Talk to your health care provider about this at age 71 when routine screening begins. A direct exam of the colon should be repeated every 5-10 years through age 44, unless early forms of precancerous polyps or small growths are found.  People who are at an increased risk for hepatitis B should be screened for this virus. You are considered at high risk for hepatitis B if:  You were born in a country where hepatitis B occurs often. Talk with your health care provider about which countries are considered high risk.  Your parents were born in a high-risk country and you have not received a shot to protect against hepatitis B (hepatitis B vaccine).  You have HIV or AIDS.  You use needles to inject street drugs.  You live with, or have sex with, someone who has hepatitis B.  You are a man who has sex with other men (MSM).  You get hemodialysis treatment.  You take certain medicines for conditions like cancer, organ transplantation, and autoimmune conditions.  Hepatitis C blood testing is recommended for all people born from [birth year] through 1964 and any individual with known risk factors for hepatitis C.  Healthy men should no longer receive prostate-specific antigen (PSA) blood tests as part of routine cancer screening. Talk to your health care provider about prostate cancer screening.  Testicular cancer screening is not recommended for adolescents or adult males who have no symptoms. Screening includes self-exam, a health care provider exam, and other screening tests. Consult with your health care provider about any symptoms you have or any concerns you have about testicular cancer.  Practice safe sex. Use condoms and avoid high-risk sexual practices to reduce the spread of sexually  transmitted infections (STIs).  You should be screened for STIs, including gonorrhea and chlamydia if:  You are sexually active and are younger than 24 years.  You are older than 24 years, and your health care provider tells you that you are at risk for this type of infection.  Your sexual activity has changed since you were last screened, and you are at an increased risk for chlamydia or gonorrhea. Ask your health care provider if you are at risk.  If you are at risk of being infected with HIV, it is recommended that you take a prescription medicine daily to prevent HIV infection. This is called pre-exposure prophylaxis (PrEP). You are considered at risk if:  You are a man who has sex with other men (MSM).  You are a heterosexual man who is sexually active with multiple partners.  You take drugs by injection.  You are sexually active with a partner who has HIV.  Talk with your health care provider about whether you are at high risk of being infected with HIV. If you choose to begin PrEP,  you should first be tested for HIV. You should then be tested every 3 months for as long as you are taking PrEP.  Use sunscreen. Apply sunscreen liberally and repeatedly throughout the day. You should seek shade when your shadow is shorter than you. Protect yourself by wearing long sleeves, pants, a wide-brimmed hat, and sunglasses year round whenever you are outdoors.  Tell your health care provider of new moles or changes in moles, especially if there is a change in shape or color. Also, tell your health care provider if a mole is larger than the size of a pencil eraser.  A one-time screening for abdominal aortic aneurysm (AAA) and surgical repair of large AAAs by ultrasound is recommended for men aged 18-75 years who are current or former smokers.  Stay current with your vaccines (immunizations). Document Released: 07/04/2007 Document Revised: 01/10/2013 Document Reviewed: 06/02/2010 [facility]  Patient Information 2015 [place], [place]. This information is not intended to replace advice given to you by your health care provider. Make sure you discuss any questions you have with your health care provider.

## 2014-09-04 NOTE — Telephone Encounter (Signed)
During wellness visit today, pt voiced concerns regarding erectile dysfunction.  He has been prescribed vardenafil, but states that medication does not work.  He said none of the medications prescribed either now or in the past have been effective.  He states he's not quite sure what his provider could do, but he would really like some advice regarding this issue.  He states that he has a new girl and it troubles him that he is unable to satisfy her sexually given his condition.  Please advise.

## 2014-09-07 NOTE — Telephone Encounter (Signed)
Spoke with patient regarding provider's recommendation.  He stated understanding and said he would call back on Monday if he wants urology referral.

## 2014-09-07 NOTE — Telephone Encounter (Signed)
Left a message for call back.  

## 2014-09-07 NOTE — Telephone Encounter (Signed)
Pt returning call. Best # [phone]

## 2014-10-03 DIAGNOSIS — Y92009 Unspecified place in unspecified non-institutional (private) residence as the place of occurrence of the external cause: Secondary | ICD-10-CM | POA: Insufficient documentation

## 2014-10-03 DIAGNOSIS — W28XXXA Contact with powered lawn mower, initial encounter: Secondary | ICD-10-CM | POA: Insufficient documentation

## 2014-10-03 DIAGNOSIS — Z8673 Personal history of transient ischemic attack (TIA), and cerebral infarction without residual deficits: Secondary | ICD-10-CM | POA: Diagnosis not present

## 2014-10-03 DIAGNOSIS — S91301A Unspecified open wound, right foot, initial encounter: Secondary | ICD-10-CM | POA: Insufficient documentation

## 2014-10-03 DIAGNOSIS — Z791 Long term (current) use of non-steroidal anti-inflammatories (NSAID): Secondary | ICD-10-CM | POA: Diagnosis not present

## 2014-10-03 DIAGNOSIS — E1142 Type 2 diabetes mellitus with diabetic polyneuropathy: Secondary | ICD-10-CM | POA: Insufficient documentation

## 2014-10-03 DIAGNOSIS — Z794 Long term (current) use of insulin: Secondary | ICD-10-CM | POA: Diagnosis not present

## 2014-10-03 DIAGNOSIS — Z886 Allergy status to analgesic agent status: Secondary | ICD-10-CM | POA: Diagnosis not present

## 2014-10-03 DIAGNOSIS — S91311A Laceration without foreign body, right foot, initial encounter: Secondary | ICD-10-CM | POA: Diagnosis not present

## 2014-10-03 DIAGNOSIS — Z87891 Personal history of nicotine dependence: Secondary | ICD-10-CM | POA: Diagnosis not present

## 2014-10-03 DIAGNOSIS — I10 Essential (primary) hypertension: Secondary | ICD-10-CM | POA: Insufficient documentation

## 2014-10-03 DIAGNOSIS — Z7982 Long term (current) use of aspirin: Secondary | ICD-10-CM | POA: Diagnosis not present

## 2014-10-03 DIAGNOSIS — S91321A Laceration with foreign body, right foot, initial encounter: Secondary | ICD-10-CM | POA: Diagnosis not present

## 2014-10-03 DIAGNOSIS — S99821A Other specified injuries of right foot, initial encounter: Secondary | ICD-10-CM | POA: Diagnosis not present

## 2014-10-03 DIAGNOSIS — S91109A Unspecified open wound of unspecified toe(s) without damage to nail, initial encounter: Secondary | ICD-10-CM | POA: Diagnosis not present

## 2014-10-03 DIAGNOSIS — S99921A Unspecified injury of right foot, initial encounter: Secondary | ICD-10-CM | POA: Diagnosis not present

## 2014-10-03 HISTORY — DX: Contact with powered lawn mower, initial encounter: W28.XXXA

## 2014-10-03 HISTORY — DX: Unspecified place in unspecified non-institutional (private) residence as the place of occurrence of the external cause: Y92.009

## 2014-10-10 DIAGNOSIS — S91031A Puncture wound without foreign body, right ankle, initial encounter: Secondary | ICD-10-CM | POA: Diagnosis not present

## 2014-10-10 DIAGNOSIS — E1340 Other specified diabetes mellitus with diabetic neuropathy, unspecified: Secondary | ICD-10-CM | POA: Diagnosis not present

## 2014-10-10 DIAGNOSIS — S91301A Unspecified open wound, right foot, initial encounter: Secondary | ICD-10-CM | POA: Diagnosis not present

## 2014-10-10 DIAGNOSIS — R278 Other lack of coordination: Secondary | ICD-10-CM | POA: Diagnosis not present

## 2014-10-10 DIAGNOSIS — E088 Diabetes mellitus due to underlying condition with unspecified complications: Secondary | ICD-10-CM | POA: Diagnosis not present

## 2014-10-10 DIAGNOSIS — M6281 Muscle weakness (generalized): Secondary | ICD-10-CM | POA: Diagnosis not present

## 2014-10-10 DIAGNOSIS — R262 Difficulty in walking, not elsewhere classified: Secondary | ICD-10-CM | POA: Diagnosis not present

## 2014-10-10 DIAGNOSIS — I10 Essential (primary) hypertension: Secondary | ICD-10-CM | POA: Diagnosis not present

## 2014-10-10 DIAGNOSIS — E1142 Type 2 diabetes mellitus with diabetic polyneuropathy: Secondary | ICD-10-CM | POA: Diagnosis not present

## 2014-10-10 DIAGNOSIS — Z8673 Personal history of transient ischemic attack (TIA), and cerebral infarction without residual deficits: Secondary | ICD-10-CM | POA: Diagnosis not present

## 2014-10-15 DIAGNOSIS — I10 Essential (primary) hypertension: Secondary | ICD-10-CM | POA: Diagnosis not present

## 2014-10-15 DIAGNOSIS — Z8673 Personal history of transient ischemic attack (TIA), and cerebral infarction without residual deficits: Secondary | ICD-10-CM | POA: Diagnosis not present

## 2014-10-15 DIAGNOSIS — E1142 Type 2 diabetes mellitus with diabetic polyneuropathy: Secondary | ICD-10-CM | POA: Diagnosis not present

## 2014-10-15 DIAGNOSIS — S91301A Unspecified open wound, right foot, initial encounter: Secondary | ICD-10-CM | POA: Diagnosis not present

## 2014-11-01 DIAGNOSIS — S91311D Laceration without foreign body, right foot, subsequent encounter: Secondary | ICD-10-CM | POA: Diagnosis not present

## 2014-11-01 DIAGNOSIS — M79671 Pain in right foot: Secondary | ICD-10-CM | POA: Diagnosis not present

## 2014-11-02 DIAGNOSIS — G709 Myoneural disorder, unspecified: Secondary | ICD-10-CM | POA: Diagnosis not present

## 2014-11-02 DIAGNOSIS — Z794 Long term (current) use of insulin: Secondary | ICD-10-CM | POA: Diagnosis not present

## 2014-11-02 DIAGNOSIS — Y92009 Unspecified place in unspecified non-institutional (private) residence as the place of occurrence of the external cause: Secondary | ICD-10-CM | POA: Diagnosis not present

## 2014-11-02 DIAGNOSIS — S91301A Unspecified open wound, right foot, initial encounter: Secondary | ICD-10-CM | POA: Diagnosis not present

## 2014-11-02 DIAGNOSIS — S91301D Unspecified open wound, right foot, subsequent encounter: Secondary | ICD-10-CM | POA: Diagnosis not present

## 2014-11-02 DIAGNOSIS — I10 Essential (primary) hypertension: Secondary | ICD-10-CM | POA: Diagnosis not present

## 2014-11-02 DIAGNOSIS — S91301S Unspecified open wound, right foot, sequela: Secondary | ICD-10-CM | POA: Diagnosis not present

## 2014-11-02 DIAGNOSIS — E1142 Type 2 diabetes mellitus with diabetic polyneuropathy: Secondary | ICD-10-CM | POA: Diagnosis not present

## 2014-11-02 DIAGNOSIS — E782 Mixed hyperlipidemia: Secondary | ICD-10-CM | POA: Diagnosis not present

## 2014-11-02 DIAGNOSIS — W28XXXS Contact with powered lawn mower, sequela: Secondary | ICD-10-CM | POA: Diagnosis not present

## 2014-11-02 DIAGNOSIS — I69351 Hemiplegia and hemiparesis following cerebral infarction affecting right dominant side: Secondary | ICD-10-CM | POA: Diagnosis not present

## 2014-11-02 DIAGNOSIS — I2510 Atherosclerotic heart disease of native coronary artery without angina pectoris: Secondary | ICD-10-CM | POA: Diagnosis not present

## 2014-11-02 DIAGNOSIS — L089 Local infection of the skin and subcutaneous tissue, unspecified: Secondary | ICD-10-CM | POA: Diagnosis not present

## 2014-11-02 DIAGNOSIS — I679 Cerebrovascular disease, unspecified: Secondary | ICD-10-CM | POA: Diagnosis not present

## 2014-11-02 DIAGNOSIS — M7989 Other specified soft tissue disorders: Secondary | ICD-10-CM | POA: Diagnosis not present

## 2014-11-02 DIAGNOSIS — L97412 Non-pressure chronic ulcer of right heel and midfoot with fat layer exposed: Secondary | ICD-10-CM | POA: Diagnosis not present

## 2014-11-02 DIAGNOSIS — M79671 Pain in right foot: Secondary | ICD-10-CM | POA: Diagnosis not present

## 2014-11-02 DIAGNOSIS — E669 Obesity, unspecified: Secondary | ICD-10-CM | POA: Diagnosis not present

## 2014-11-02 DIAGNOSIS — S91311D Laceration without foreign body, right foot, subsequent encounter: Secondary | ICD-10-CM | POA: Diagnosis not present

## 2014-11-03 DIAGNOSIS — Z9889 Other specified postprocedural states: Secondary | ICD-10-CM | POA: Insufficient documentation

## 2014-11-03 DIAGNOSIS — J383 Other diseases of vocal cords: Secondary | ICD-10-CM | POA: Insufficient documentation

## 2014-11-09 DIAGNOSIS — E1142 Type 2 diabetes mellitus with diabetic polyneuropathy: Secondary | ICD-10-CM | POA: Diagnosis not present

## 2014-11-09 DIAGNOSIS — Z4889 Encounter for other specified surgical aftercare: Secondary | ICD-10-CM | POA: Diagnosis not present

## 2014-11-09 DIAGNOSIS — S91301D Unspecified open wound, right foot, subsequent encounter: Secondary | ICD-10-CM | POA: Diagnosis not present

## 2014-11-15 DIAGNOSIS — S91301S Unspecified open wound, right foot, sequela: Secondary | ICD-10-CM | POA: Diagnosis not present

## 2014-11-15 DIAGNOSIS — E1142 Type 2 diabetes mellitus with diabetic polyneuropathy: Secondary | ICD-10-CM | POA: Diagnosis not present

## 2014-11-20 DIAGNOSIS — R2689 Other abnormalities of gait and mobility: Secondary | ICD-10-CM | POA: Diagnosis not present

## 2014-11-20 DIAGNOSIS — F329 Major depressive disorder, single episode, unspecified: Secondary | ICD-10-CM | POA: Diagnosis not present

## 2014-11-20 DIAGNOSIS — M6281 Muscle weakness (generalized): Secondary | ICD-10-CM | POA: Diagnosis not present

## 2014-11-20 DIAGNOSIS — E1142 Type 2 diabetes mellitus with diabetic polyneuropathy: Secondary | ICD-10-CM | POA: Diagnosis not present

## 2014-11-20 DIAGNOSIS — S91301D Unspecified open wound, right foot, subsequent encounter: Secondary | ICD-10-CM | POA: Diagnosis not present

## 2014-11-20 DIAGNOSIS — Z4800 Encounter for change or removal of nonsurgical wound dressing: Secondary | ICD-10-CM | POA: Diagnosis not present

## 2014-11-20 DIAGNOSIS — Z7982 Long term (current) use of aspirin: Secondary | ICD-10-CM | POA: Diagnosis not present

## 2014-11-20 DIAGNOSIS — Z794 Long term (current) use of insulin: Secondary | ICD-10-CM | POA: Diagnosis not present

## 2014-11-20 DIAGNOSIS — Z9181 History of falling: Secondary | ICD-10-CM | POA: Diagnosis not present

## 2014-11-21 DIAGNOSIS — S91301S Unspecified open wound, right foot, sequela: Secondary | ICD-10-CM | POA: Diagnosis not present

## 2014-11-22 DIAGNOSIS — Z794 Long term (current) use of insulin: Secondary | ICD-10-CM | POA: Diagnosis not present

## 2014-11-22 DIAGNOSIS — S91301D Unspecified open wound, right foot, subsequent encounter: Secondary | ICD-10-CM | POA: Diagnosis not present

## 2014-11-22 DIAGNOSIS — Z4800 Encounter for change or removal of nonsurgical wound dressing: Secondary | ICD-10-CM | POA: Diagnosis not present

## 2014-11-22 DIAGNOSIS — Z7982 Long term (current) use of aspirin: Secondary | ICD-10-CM | POA: Diagnosis not present

## 2014-11-22 DIAGNOSIS — E1142 Type 2 diabetes mellitus with diabetic polyneuropathy: Secondary | ICD-10-CM | POA: Diagnosis not present

## 2014-11-22 DIAGNOSIS — M6281 Muscle weakness (generalized): Secondary | ICD-10-CM | POA: Diagnosis not present

## 2014-11-22 DIAGNOSIS — Z9181 History of falling: Secondary | ICD-10-CM | POA: Diagnosis not present

## 2014-11-22 DIAGNOSIS — F329 Major depressive disorder, single episode, unspecified: Secondary | ICD-10-CM | POA: Diagnosis not present

## 2014-11-22 DIAGNOSIS — R2689 Other abnormalities of gait and mobility: Secondary | ICD-10-CM | POA: Diagnosis not present

## 2014-11-23 DIAGNOSIS — Z7982 Long term (current) use of aspirin: Secondary | ICD-10-CM | POA: Diagnosis not present

## 2014-11-23 DIAGNOSIS — E1142 Type 2 diabetes mellitus with diabetic polyneuropathy: Secondary | ICD-10-CM | POA: Diagnosis not present

## 2014-11-23 DIAGNOSIS — S91301D Unspecified open wound, right foot, subsequent encounter: Secondary | ICD-10-CM | POA: Diagnosis not present

## 2014-11-23 DIAGNOSIS — Z9181 History of falling: Secondary | ICD-10-CM | POA: Diagnosis not present

## 2014-11-23 DIAGNOSIS — R2689 Other abnormalities of gait and mobility: Secondary | ICD-10-CM | POA: Diagnosis not present

## 2014-11-23 DIAGNOSIS — F329 Major depressive disorder, single episode, unspecified: Secondary | ICD-10-CM | POA: Diagnosis not present

## 2014-11-23 DIAGNOSIS — Z4800 Encounter for change or removal of nonsurgical wound dressing: Secondary | ICD-10-CM | POA: Diagnosis not present

## 2014-11-23 DIAGNOSIS — Z794 Long term (current) use of insulin: Secondary | ICD-10-CM | POA: Diagnosis not present

## 2014-11-23 DIAGNOSIS — M6281 Muscle weakness (generalized): Secondary | ICD-10-CM | POA: Diagnosis not present

## 2014-11-26 DIAGNOSIS — F329 Major depressive disorder, single episode, unspecified: Secondary | ICD-10-CM | POA: Diagnosis not present

## 2014-11-26 DIAGNOSIS — Z4800 Encounter for change or removal of nonsurgical wound dressing: Secondary | ICD-10-CM | POA: Diagnosis not present

## 2014-11-26 DIAGNOSIS — Z7982 Long term (current) use of aspirin: Secondary | ICD-10-CM | POA: Diagnosis not present

## 2014-11-26 DIAGNOSIS — R2689 Other abnormalities of gait and mobility: Secondary | ICD-10-CM | POA: Diagnosis not present

## 2014-11-26 DIAGNOSIS — E1142 Type 2 diabetes mellitus with diabetic polyneuropathy: Secondary | ICD-10-CM | POA: Diagnosis not present

## 2014-11-26 DIAGNOSIS — M6281 Muscle weakness (generalized): Secondary | ICD-10-CM | POA: Diagnosis not present

## 2014-11-26 DIAGNOSIS — Z794 Long term (current) use of insulin: Secondary | ICD-10-CM | POA: Diagnosis not present

## 2014-11-26 DIAGNOSIS — Z9181 History of falling: Secondary | ICD-10-CM | POA: Diagnosis not present

## 2014-11-26 DIAGNOSIS — T8189XA Other complications of procedures, not elsewhere classified, initial encounter: Secondary | ICD-10-CM | POA: Diagnosis not present

## 2014-11-26 DIAGNOSIS — S91301D Unspecified open wound, right foot, subsequent encounter: Secondary | ICD-10-CM | POA: Diagnosis not present

## 2014-11-27 DIAGNOSIS — F329 Major depressive disorder, single episode, unspecified: Secondary | ICD-10-CM | POA: Diagnosis not present

## 2014-11-27 DIAGNOSIS — Z4800 Encounter for change or removal of nonsurgical wound dressing: Secondary | ICD-10-CM | POA: Diagnosis not present

## 2014-11-27 DIAGNOSIS — M6281 Muscle weakness (generalized): Secondary | ICD-10-CM | POA: Diagnosis not present

## 2014-11-27 DIAGNOSIS — Z794 Long term (current) use of insulin: Secondary | ICD-10-CM | POA: Diagnosis not present

## 2014-11-27 DIAGNOSIS — Z7982 Long term (current) use of aspirin: Secondary | ICD-10-CM | POA: Diagnosis not present

## 2014-11-27 DIAGNOSIS — R2689 Other abnormalities of gait and mobility: Secondary | ICD-10-CM | POA: Diagnosis not present

## 2014-11-27 DIAGNOSIS — E1142 Type 2 diabetes mellitus with diabetic polyneuropathy: Secondary | ICD-10-CM | POA: Diagnosis not present

## 2014-11-27 DIAGNOSIS — Z9181 History of falling: Secondary | ICD-10-CM | POA: Diagnosis not present

## 2014-11-27 DIAGNOSIS — S91301D Unspecified open wound, right foot, subsequent encounter: Secondary | ICD-10-CM | POA: Diagnosis not present

## 2014-11-28 DIAGNOSIS — Z4800 Encounter for change or removal of nonsurgical wound dressing: Secondary | ICD-10-CM | POA: Diagnosis not present

## 2014-11-28 DIAGNOSIS — S91301D Unspecified open wound, right foot, subsequent encounter: Secondary | ICD-10-CM | POA: Diagnosis not present

## 2014-11-28 DIAGNOSIS — M6281 Muscle weakness (generalized): Secondary | ICD-10-CM | POA: Diagnosis not present

## 2014-11-28 DIAGNOSIS — Z9181 History of falling: Secondary | ICD-10-CM | POA: Diagnosis not present

## 2014-11-28 DIAGNOSIS — E1142 Type 2 diabetes mellitus with diabetic polyneuropathy: Secondary | ICD-10-CM | POA: Diagnosis not present

## 2014-11-28 DIAGNOSIS — Z794 Long term (current) use of insulin: Secondary | ICD-10-CM | POA: Diagnosis not present

## 2014-11-28 DIAGNOSIS — Z7982 Long term (current) use of aspirin: Secondary | ICD-10-CM | POA: Diagnosis not present

## 2014-11-28 DIAGNOSIS — F329 Major depressive disorder, single episode, unspecified: Secondary | ICD-10-CM | POA: Diagnosis not present

## 2014-11-28 DIAGNOSIS — R2689 Other abnormalities of gait and mobility: Secondary | ICD-10-CM | POA: Diagnosis not present

## 2014-11-29 DIAGNOSIS — Z7982 Long term (current) use of aspirin: Secondary | ICD-10-CM | POA: Diagnosis not present

## 2014-11-29 DIAGNOSIS — Z4800 Encounter for change or removal of nonsurgical wound dressing: Secondary | ICD-10-CM | POA: Diagnosis not present

## 2014-11-29 DIAGNOSIS — Z794 Long term (current) use of insulin: Secondary | ICD-10-CM | POA: Diagnosis not present

## 2014-11-29 DIAGNOSIS — Z9181 History of falling: Secondary | ICD-10-CM | POA: Diagnosis not present

## 2014-11-29 DIAGNOSIS — E1142 Type 2 diabetes mellitus with diabetic polyneuropathy: Secondary | ICD-10-CM | POA: Diagnosis not present

## 2014-11-29 DIAGNOSIS — S91301D Unspecified open wound, right foot, subsequent encounter: Secondary | ICD-10-CM | POA: Diagnosis not present

## 2014-11-29 DIAGNOSIS — R2689 Other abnormalities of gait and mobility: Secondary | ICD-10-CM | POA: Diagnosis not present

## 2014-11-29 DIAGNOSIS — M6281 Muscle weakness (generalized): Secondary | ICD-10-CM | POA: Diagnosis not present

## 2014-11-29 DIAGNOSIS — F329 Major depressive disorder, single episode, unspecified: Secondary | ICD-10-CM | POA: Diagnosis not present

## 2014-11-30 DIAGNOSIS — M6281 Muscle weakness (generalized): Secondary | ICD-10-CM | POA: Diagnosis not present

## 2014-11-30 DIAGNOSIS — R2689 Other abnormalities of gait and mobility: Secondary | ICD-10-CM | POA: Diagnosis not present

## 2014-11-30 DIAGNOSIS — Z794 Long term (current) use of insulin: Secondary | ICD-10-CM | POA: Diagnosis not present

## 2014-11-30 DIAGNOSIS — Z9181 History of falling: Secondary | ICD-10-CM | POA: Diagnosis not present

## 2014-11-30 DIAGNOSIS — Z4800 Encounter for change or removal of nonsurgical wound dressing: Secondary | ICD-10-CM | POA: Diagnosis not present

## 2014-11-30 DIAGNOSIS — Z7982 Long term (current) use of aspirin: Secondary | ICD-10-CM | POA: Diagnosis not present

## 2014-11-30 DIAGNOSIS — E1142 Type 2 diabetes mellitus with diabetic polyneuropathy: Secondary | ICD-10-CM | POA: Diagnosis not present

## 2014-11-30 DIAGNOSIS — F329 Major depressive disorder, single episode, unspecified: Secondary | ICD-10-CM | POA: Diagnosis not present

## 2014-11-30 DIAGNOSIS — S91301D Unspecified open wound, right foot, subsequent encounter: Secondary | ICD-10-CM | POA: Diagnosis not present

## 2014-12-03 DIAGNOSIS — E1142 Type 2 diabetes mellitus with diabetic polyneuropathy: Secondary | ICD-10-CM | POA: Diagnosis not present

## 2014-12-03 DIAGNOSIS — M6281 Muscle weakness (generalized): Secondary | ICD-10-CM | POA: Diagnosis not present

## 2014-12-03 DIAGNOSIS — Z9181 History of falling: Secondary | ICD-10-CM | POA: Diagnosis not present

## 2014-12-03 DIAGNOSIS — R2689 Other abnormalities of gait and mobility: Secondary | ICD-10-CM | POA: Diagnosis not present

## 2014-12-03 DIAGNOSIS — Z7982 Long term (current) use of aspirin: Secondary | ICD-10-CM | POA: Diagnosis not present

## 2014-12-03 DIAGNOSIS — Z4800 Encounter for change or removal of nonsurgical wound dressing: Secondary | ICD-10-CM | POA: Diagnosis not present

## 2014-12-03 DIAGNOSIS — S91301D Unspecified open wound, right foot, subsequent encounter: Secondary | ICD-10-CM | POA: Diagnosis not present

## 2014-12-03 DIAGNOSIS — F329 Major depressive disorder, single episode, unspecified: Secondary | ICD-10-CM | POA: Diagnosis not present

## 2014-12-03 DIAGNOSIS — Z794 Long term (current) use of insulin: Secondary | ICD-10-CM | POA: Diagnosis not present

## 2014-12-04 DIAGNOSIS — E1142 Type 2 diabetes mellitus with diabetic polyneuropathy: Secondary | ICD-10-CM | POA: Diagnosis not present

## 2014-12-04 DIAGNOSIS — S91301D Unspecified open wound, right foot, subsequent encounter: Secondary | ICD-10-CM | POA: Diagnosis not present

## 2014-12-05 DIAGNOSIS — F329 Major depressive disorder, single episode, unspecified: Secondary | ICD-10-CM | POA: Diagnosis not present

## 2014-12-05 DIAGNOSIS — M6281 Muscle weakness (generalized): Secondary | ICD-10-CM | POA: Diagnosis not present

## 2014-12-05 DIAGNOSIS — Z9181 History of falling: Secondary | ICD-10-CM | POA: Diagnosis not present

## 2014-12-05 DIAGNOSIS — Z7982 Long term (current) use of aspirin: Secondary | ICD-10-CM | POA: Diagnosis not present

## 2014-12-05 DIAGNOSIS — S91301D Unspecified open wound, right foot, subsequent encounter: Secondary | ICD-10-CM | POA: Diagnosis not present

## 2014-12-05 DIAGNOSIS — E1142 Type 2 diabetes mellitus with diabetic polyneuropathy: Secondary | ICD-10-CM | POA: Diagnosis not present

## 2014-12-05 DIAGNOSIS — R2689 Other abnormalities of gait and mobility: Secondary | ICD-10-CM | POA: Diagnosis not present

## 2014-12-05 DIAGNOSIS — Z794 Long term (current) use of insulin: Secondary | ICD-10-CM | POA: Diagnosis not present

## 2014-12-05 DIAGNOSIS — Z4800 Encounter for change or removal of nonsurgical wound dressing: Secondary | ICD-10-CM | POA: Diagnosis not present

## 2014-12-06 DIAGNOSIS — R2689 Other abnormalities of gait and mobility: Secondary | ICD-10-CM | POA: Diagnosis not present

## 2014-12-06 DIAGNOSIS — Z4800 Encounter for change or removal of nonsurgical wound dressing: Secondary | ICD-10-CM | POA: Diagnosis not present

## 2014-12-06 DIAGNOSIS — Z794 Long term (current) use of insulin: Secondary | ICD-10-CM | POA: Diagnosis not present

## 2014-12-06 DIAGNOSIS — S91301D Unspecified open wound, right foot, subsequent encounter: Secondary | ICD-10-CM | POA: Diagnosis not present

## 2014-12-06 DIAGNOSIS — F329 Major depressive disorder, single episode, unspecified: Secondary | ICD-10-CM | POA: Diagnosis not present

## 2014-12-06 DIAGNOSIS — M6281 Muscle weakness (generalized): Secondary | ICD-10-CM | POA: Diagnosis not present

## 2014-12-06 DIAGNOSIS — Z7982 Long term (current) use of aspirin: Secondary | ICD-10-CM | POA: Diagnosis not present

## 2014-12-06 DIAGNOSIS — Z9181 History of falling: Secondary | ICD-10-CM | POA: Diagnosis not present

## 2014-12-06 DIAGNOSIS — E1142 Type 2 diabetes mellitus with diabetic polyneuropathy: Secondary | ICD-10-CM | POA: Diagnosis not present

## 2014-12-07 DIAGNOSIS — E1142 Type 2 diabetes mellitus with diabetic polyneuropathy: Secondary | ICD-10-CM | POA: Diagnosis not present

## 2014-12-07 DIAGNOSIS — Z9181 History of falling: Secondary | ICD-10-CM | POA: Diagnosis not present

## 2014-12-07 DIAGNOSIS — R2689 Other abnormalities of gait and mobility: Secondary | ICD-10-CM | POA: Diagnosis not present

## 2014-12-07 DIAGNOSIS — Z4800 Encounter for change or removal of nonsurgical wound dressing: Secondary | ICD-10-CM | POA: Diagnosis not present

## 2014-12-07 DIAGNOSIS — S91301D Unspecified open wound, right foot, subsequent encounter: Secondary | ICD-10-CM | POA: Diagnosis not present

## 2014-12-07 DIAGNOSIS — Z794 Long term (current) use of insulin: Secondary | ICD-10-CM | POA: Diagnosis not present

## 2014-12-07 DIAGNOSIS — Z7982 Long term (current) use of aspirin: Secondary | ICD-10-CM | POA: Diagnosis not present

## 2014-12-07 DIAGNOSIS — F329 Major depressive disorder, single episode, unspecified: Secondary | ICD-10-CM | POA: Diagnosis not present

## 2014-12-07 DIAGNOSIS — M6281 Muscle weakness (generalized): Secondary | ICD-10-CM | POA: Diagnosis not present

## 2014-12-09 DIAGNOSIS — Z794 Long term (current) use of insulin: Secondary | ICD-10-CM | POA: Diagnosis not present

## 2014-12-09 DIAGNOSIS — S91301D Unspecified open wound, right foot, subsequent encounter: Secondary | ICD-10-CM | POA: Diagnosis not present

## 2014-12-09 DIAGNOSIS — F329 Major depressive disorder, single episode, unspecified: Secondary | ICD-10-CM | POA: Diagnosis not present

## 2014-12-09 DIAGNOSIS — M6281 Muscle weakness (generalized): Secondary | ICD-10-CM | POA: Diagnosis not present

## 2014-12-09 DIAGNOSIS — R2689 Other abnormalities of gait and mobility: Secondary | ICD-10-CM | POA: Diagnosis not present

## 2014-12-09 DIAGNOSIS — Z7982 Long term (current) use of aspirin: Secondary | ICD-10-CM | POA: Diagnosis not present

## 2014-12-09 DIAGNOSIS — E1142 Type 2 diabetes mellitus with diabetic polyneuropathy: Secondary | ICD-10-CM | POA: Diagnosis not present

## 2014-12-09 DIAGNOSIS — Z4800 Encounter for change or removal of nonsurgical wound dressing: Secondary | ICD-10-CM | POA: Diagnosis not present

## 2014-12-09 DIAGNOSIS — Z9181 History of falling: Secondary | ICD-10-CM | POA: Diagnosis not present

## 2014-12-10 DIAGNOSIS — F329 Major depressive disorder, single episode, unspecified: Secondary | ICD-10-CM | POA: Diagnosis not present

## 2014-12-10 DIAGNOSIS — E1142 Type 2 diabetes mellitus with diabetic polyneuropathy: Secondary | ICD-10-CM | POA: Diagnosis not present

## 2014-12-10 DIAGNOSIS — T8189XA Other complications of procedures, not elsewhere classified, initial encounter: Secondary | ICD-10-CM | POA: Diagnosis not present

## 2014-12-10 DIAGNOSIS — Z7982 Long term (current) use of aspirin: Secondary | ICD-10-CM | POA: Diagnosis not present

## 2014-12-10 DIAGNOSIS — M6281 Muscle weakness (generalized): Secondary | ICD-10-CM | POA: Diagnosis not present

## 2014-12-10 DIAGNOSIS — R2689 Other abnormalities of gait and mobility: Secondary | ICD-10-CM | POA: Diagnosis not present

## 2014-12-10 DIAGNOSIS — Z794 Long term (current) use of insulin: Secondary | ICD-10-CM | POA: Diagnosis not present

## 2014-12-10 DIAGNOSIS — Z9181 History of falling: Secondary | ICD-10-CM | POA: Diagnosis not present

## 2014-12-10 DIAGNOSIS — Z4800 Encounter for change or removal of nonsurgical wound dressing: Secondary | ICD-10-CM | POA: Diagnosis not present

## 2014-12-10 DIAGNOSIS — S91301D Unspecified open wound, right foot, subsequent encounter: Secondary | ICD-10-CM | POA: Diagnosis not present

## 2014-12-11 DIAGNOSIS — E1142 Type 2 diabetes mellitus with diabetic polyneuropathy: Secondary | ICD-10-CM | POA: Diagnosis not present

## 2014-12-11 DIAGNOSIS — Z7982 Long term (current) use of aspirin: Secondary | ICD-10-CM | POA: Diagnosis not present

## 2014-12-11 DIAGNOSIS — M6281 Muscle weakness (generalized): Secondary | ICD-10-CM | POA: Diagnosis not present

## 2014-12-11 DIAGNOSIS — Z9181 History of falling: Secondary | ICD-10-CM | POA: Diagnosis not present

## 2014-12-11 DIAGNOSIS — S91301D Unspecified open wound, right foot, subsequent encounter: Secondary | ICD-10-CM | POA: Diagnosis not present

## 2014-12-11 DIAGNOSIS — Z794 Long term (current) use of insulin: Secondary | ICD-10-CM | POA: Diagnosis not present

## 2014-12-11 DIAGNOSIS — F329 Major depressive disorder, single episode, unspecified: Secondary | ICD-10-CM | POA: Diagnosis not present

## 2014-12-11 DIAGNOSIS — Z4800 Encounter for change or removal of nonsurgical wound dressing: Secondary | ICD-10-CM | POA: Diagnosis not present

## 2014-12-11 DIAGNOSIS — R2689 Other abnormalities of gait and mobility: Secondary | ICD-10-CM | POA: Diagnosis not present

## 2014-12-12 DIAGNOSIS — Z7982 Long term (current) use of aspirin: Secondary | ICD-10-CM | POA: Diagnosis not present

## 2014-12-12 DIAGNOSIS — R2689 Other abnormalities of gait and mobility: Secondary | ICD-10-CM | POA: Diagnosis not present

## 2014-12-12 DIAGNOSIS — Z9181 History of falling: Secondary | ICD-10-CM | POA: Diagnosis not present

## 2014-12-12 DIAGNOSIS — S91301D Unspecified open wound, right foot, subsequent encounter: Secondary | ICD-10-CM | POA: Diagnosis not present

## 2014-12-12 DIAGNOSIS — Z4800 Encounter for change or removal of nonsurgical wound dressing: Secondary | ICD-10-CM | POA: Diagnosis not present

## 2014-12-12 DIAGNOSIS — Z794 Long term (current) use of insulin: Secondary | ICD-10-CM | POA: Diagnosis not present

## 2014-12-12 DIAGNOSIS — M6281 Muscle weakness (generalized): Secondary | ICD-10-CM | POA: Diagnosis not present

## 2014-12-12 DIAGNOSIS — E1142 Type 2 diabetes mellitus with diabetic polyneuropathy: Secondary | ICD-10-CM | POA: Diagnosis not present

## 2014-12-12 DIAGNOSIS — F329 Major depressive disorder, single episode, unspecified: Secondary | ICD-10-CM | POA: Diagnosis not present

## 2014-12-14 DIAGNOSIS — Z4800 Encounter for change or removal of nonsurgical wound dressing: Secondary | ICD-10-CM | POA: Diagnosis not present

## 2014-12-14 DIAGNOSIS — Z9181 History of falling: Secondary | ICD-10-CM | POA: Diagnosis not present

## 2014-12-14 DIAGNOSIS — F329 Major depressive disorder, single episode, unspecified: Secondary | ICD-10-CM | POA: Diagnosis not present

## 2014-12-14 DIAGNOSIS — M6281 Muscle weakness (generalized): Secondary | ICD-10-CM | POA: Diagnosis not present

## 2014-12-14 DIAGNOSIS — R2689 Other abnormalities of gait and mobility: Secondary | ICD-10-CM | POA: Diagnosis not present

## 2014-12-14 DIAGNOSIS — Z7982 Long term (current) use of aspirin: Secondary | ICD-10-CM | POA: Diagnosis not present

## 2014-12-14 DIAGNOSIS — S91301D Unspecified open wound, right foot, subsequent encounter: Secondary | ICD-10-CM | POA: Diagnosis not present

## 2014-12-14 DIAGNOSIS — E1142 Type 2 diabetes mellitus with diabetic polyneuropathy: Secondary | ICD-10-CM | POA: Diagnosis not present

## 2014-12-14 DIAGNOSIS — Z794 Long term (current) use of insulin: Secondary | ICD-10-CM | POA: Diagnosis not present

## 2014-12-17 DIAGNOSIS — E1142 Type 2 diabetes mellitus with diabetic polyneuropathy: Secondary | ICD-10-CM | POA: Diagnosis not present

## 2014-12-17 DIAGNOSIS — Z7982 Long term (current) use of aspirin: Secondary | ICD-10-CM | POA: Diagnosis not present

## 2014-12-17 DIAGNOSIS — R2689 Other abnormalities of gait and mobility: Secondary | ICD-10-CM | POA: Diagnosis not present

## 2014-12-17 DIAGNOSIS — F329 Major depressive disorder, single episode, unspecified: Secondary | ICD-10-CM | POA: Diagnosis not present

## 2014-12-17 DIAGNOSIS — M6281 Muscle weakness (generalized): Secondary | ICD-10-CM | POA: Diagnosis not present

## 2014-12-17 DIAGNOSIS — Z4800 Encounter for change or removal of nonsurgical wound dressing: Secondary | ICD-10-CM | POA: Diagnosis not present

## 2014-12-17 DIAGNOSIS — S91301D Unspecified open wound, right foot, subsequent encounter: Secondary | ICD-10-CM | POA: Diagnosis not present

## 2014-12-17 DIAGNOSIS — Z9181 History of falling: Secondary | ICD-10-CM | POA: Diagnosis not present

## 2014-12-17 DIAGNOSIS — Z794 Long term (current) use of insulin: Secondary | ICD-10-CM | POA: Diagnosis not present

## 2014-12-19 DIAGNOSIS — S91301D Unspecified open wound, right foot, subsequent encounter: Secondary | ICD-10-CM | POA: Diagnosis not present

## 2014-12-19 DIAGNOSIS — Z7982 Long term (current) use of aspirin: Secondary | ICD-10-CM | POA: Diagnosis not present

## 2014-12-19 DIAGNOSIS — M6281 Muscle weakness (generalized): Secondary | ICD-10-CM | POA: Diagnosis not present

## 2014-12-19 DIAGNOSIS — Z4800 Encounter for change or removal of nonsurgical wound dressing: Secondary | ICD-10-CM | POA: Diagnosis not present

## 2014-12-19 DIAGNOSIS — Z794 Long term (current) use of insulin: Secondary | ICD-10-CM | POA: Diagnosis not present

## 2014-12-19 DIAGNOSIS — Z9181 History of falling: Secondary | ICD-10-CM | POA: Diagnosis not present

## 2014-12-19 DIAGNOSIS — R2689 Other abnormalities of gait and mobility: Secondary | ICD-10-CM | POA: Diagnosis not present

## 2014-12-19 DIAGNOSIS — E1142 Type 2 diabetes mellitus with diabetic polyneuropathy: Secondary | ICD-10-CM | POA: Diagnosis not present

## 2014-12-19 DIAGNOSIS — F329 Major depressive disorder, single episode, unspecified: Secondary | ICD-10-CM | POA: Diagnosis not present

## 2014-12-21 DIAGNOSIS — E1142 Type 2 diabetes mellitus with diabetic polyneuropathy: Secondary | ICD-10-CM | POA: Diagnosis not present

## 2014-12-21 DIAGNOSIS — Z794 Long term (current) use of insulin: Secondary | ICD-10-CM | POA: Diagnosis not present

## 2014-12-21 DIAGNOSIS — R2689 Other abnormalities of gait and mobility: Secondary | ICD-10-CM | POA: Diagnosis not present

## 2014-12-21 DIAGNOSIS — M6281 Muscle weakness (generalized): Secondary | ICD-10-CM | POA: Diagnosis not present

## 2014-12-21 DIAGNOSIS — Z7982 Long term (current) use of aspirin: Secondary | ICD-10-CM | POA: Diagnosis not present

## 2014-12-21 DIAGNOSIS — Z4800 Encounter for change or removal of nonsurgical wound dressing: Secondary | ICD-10-CM | POA: Diagnosis not present

## 2014-12-21 DIAGNOSIS — S91301D Unspecified open wound, right foot, subsequent encounter: Secondary | ICD-10-CM | POA: Diagnosis not present

## 2014-12-21 DIAGNOSIS — Z9181 History of falling: Secondary | ICD-10-CM | POA: Diagnosis not present

## 2014-12-21 DIAGNOSIS — F329 Major depressive disorder, single episode, unspecified: Secondary | ICD-10-CM | POA: Diagnosis not present

## 2014-12-24 DIAGNOSIS — S91301D Unspecified open wound, right foot, subsequent encounter: Secondary | ICD-10-CM | POA: Diagnosis not present

## 2014-12-24 DIAGNOSIS — F329 Major depressive disorder, single episode, unspecified: Secondary | ICD-10-CM | POA: Diagnosis not present

## 2014-12-24 DIAGNOSIS — Z4800 Encounter for change or removal of nonsurgical wound dressing: Secondary | ICD-10-CM | POA: Diagnosis not present

## 2014-12-24 DIAGNOSIS — R2689 Other abnormalities of gait and mobility: Secondary | ICD-10-CM | POA: Diagnosis not present

## 2014-12-24 DIAGNOSIS — Z9181 History of falling: Secondary | ICD-10-CM | POA: Diagnosis not present

## 2014-12-24 DIAGNOSIS — Z7982 Long term (current) use of aspirin: Secondary | ICD-10-CM | POA: Diagnosis not present

## 2014-12-24 DIAGNOSIS — E1142 Type 2 diabetes mellitus with diabetic polyneuropathy: Secondary | ICD-10-CM | POA: Diagnosis not present

## 2014-12-24 DIAGNOSIS — Z794 Long term (current) use of insulin: Secondary | ICD-10-CM | POA: Diagnosis not present

## 2014-12-24 DIAGNOSIS — M6281 Muscle weakness (generalized): Secondary | ICD-10-CM | POA: Diagnosis not present

## 2014-12-25 DIAGNOSIS — Z9181 History of falling: Secondary | ICD-10-CM | POA: Diagnosis not present

## 2014-12-25 DIAGNOSIS — Z4800 Encounter for change or removal of nonsurgical wound dressing: Secondary | ICD-10-CM | POA: Diagnosis not present

## 2014-12-25 DIAGNOSIS — F329 Major depressive disorder, single episode, unspecified: Secondary | ICD-10-CM | POA: Diagnosis not present

## 2014-12-25 DIAGNOSIS — E1142 Type 2 diabetes mellitus with diabetic polyneuropathy: Secondary | ICD-10-CM | POA: Diagnosis not present

## 2014-12-25 DIAGNOSIS — Z7982 Long term (current) use of aspirin: Secondary | ICD-10-CM | POA: Diagnosis not present

## 2014-12-25 DIAGNOSIS — M6281 Muscle weakness (generalized): Secondary | ICD-10-CM | POA: Diagnosis not present

## 2014-12-25 DIAGNOSIS — S91301D Unspecified open wound, right foot, subsequent encounter: Secondary | ICD-10-CM | POA: Diagnosis not present

## 2014-12-25 DIAGNOSIS — Z794 Long term (current) use of insulin: Secondary | ICD-10-CM | POA: Diagnosis not present

## 2014-12-25 DIAGNOSIS — R2689 Other abnormalities of gait and mobility: Secondary | ICD-10-CM | POA: Diagnosis not present

## 2014-12-26 DIAGNOSIS — Z9181 History of falling: Secondary | ICD-10-CM | POA: Diagnosis not present

## 2014-12-26 DIAGNOSIS — M6281 Muscle weakness (generalized): Secondary | ICD-10-CM | POA: Diagnosis not present

## 2014-12-26 DIAGNOSIS — R2689 Other abnormalities of gait and mobility: Secondary | ICD-10-CM | POA: Diagnosis not present

## 2014-12-26 DIAGNOSIS — Z794 Long term (current) use of insulin: Secondary | ICD-10-CM | POA: Diagnosis not present

## 2014-12-26 DIAGNOSIS — Z4800 Encounter for change or removal of nonsurgical wound dressing: Secondary | ICD-10-CM | POA: Diagnosis not present

## 2014-12-26 DIAGNOSIS — E1142 Type 2 diabetes mellitus with diabetic polyneuropathy: Secondary | ICD-10-CM | POA: Diagnosis not present

## 2014-12-26 DIAGNOSIS — F329 Major depressive disorder, single episode, unspecified: Secondary | ICD-10-CM | POA: Diagnosis not present

## 2014-12-26 DIAGNOSIS — Z7982 Long term (current) use of aspirin: Secondary | ICD-10-CM | POA: Diagnosis not present

## 2014-12-26 DIAGNOSIS — S91301D Unspecified open wound, right foot, subsequent encounter: Secondary | ICD-10-CM | POA: Diagnosis not present

## 2014-12-27 DIAGNOSIS — Z9181 History of falling: Secondary | ICD-10-CM | POA: Diagnosis not present

## 2014-12-27 DIAGNOSIS — M6281 Muscle weakness (generalized): Secondary | ICD-10-CM | POA: Diagnosis not present

## 2014-12-27 DIAGNOSIS — R2689 Other abnormalities of gait and mobility: Secondary | ICD-10-CM | POA: Diagnosis not present

## 2014-12-27 DIAGNOSIS — F329 Major depressive disorder, single episode, unspecified: Secondary | ICD-10-CM | POA: Diagnosis not present

## 2014-12-27 DIAGNOSIS — S91301D Unspecified open wound, right foot, subsequent encounter: Secondary | ICD-10-CM | POA: Diagnosis not present

## 2014-12-27 DIAGNOSIS — Z4800 Encounter for change or removal of nonsurgical wound dressing: Secondary | ICD-10-CM | POA: Diagnosis not present

## 2014-12-27 DIAGNOSIS — Z7982 Long term (current) use of aspirin: Secondary | ICD-10-CM | POA: Diagnosis not present

## 2014-12-27 DIAGNOSIS — E1142 Type 2 diabetes mellitus with diabetic polyneuropathy: Secondary | ICD-10-CM | POA: Diagnosis not present

## 2014-12-27 DIAGNOSIS — Z794 Long term (current) use of insulin: Secondary | ICD-10-CM | POA: Diagnosis not present

## 2014-12-28 DIAGNOSIS — R6889 Other general symptoms and signs: Secondary | ICD-10-CM | POA: Diagnosis not present

## 2014-12-28 DIAGNOSIS — Z87891 Personal history of nicotine dependence: Secondary | ICD-10-CM | POA: Diagnosis not present

## 2014-12-28 DIAGNOSIS — S91301A Unspecified open wound, right foot, initial encounter: Secondary | ICD-10-CM | POA: Diagnosis not present

## 2014-12-28 DIAGNOSIS — Z794 Long term (current) use of insulin: Secondary | ICD-10-CM | POA: Diagnosis not present

## 2014-12-28 DIAGNOSIS — E1140 Type 2 diabetes mellitus with diabetic neuropathy, unspecified: Secondary | ICD-10-CM | POA: Diagnosis not present

## 2014-12-28 DIAGNOSIS — Z8673 Personal history of transient ischemic attack (TIA), and cerebral infarction without residual deficits: Secondary | ICD-10-CM | POA: Diagnosis not present

## 2014-12-28 DIAGNOSIS — Z7982 Long term (current) use of aspirin: Secondary | ICD-10-CM | POA: Diagnosis not present

## 2014-12-28 DIAGNOSIS — I10 Essential (primary) hypertension: Secondary | ICD-10-CM | POA: Diagnosis not present

## 2014-12-28 DIAGNOSIS — S91311D Laceration without foreign body, right foot, subsequent encounter: Secondary | ICD-10-CM | POA: Diagnosis not present

## 2014-12-28 DIAGNOSIS — Z79899 Other long term (current) drug therapy: Secondary | ICD-10-CM | POA: Diagnosis not present

## 2014-12-28 DIAGNOSIS — E785 Hyperlipidemia, unspecified: Secondary | ICD-10-CM | POA: Diagnosis not present

## 2014-12-31 DIAGNOSIS — S91301D Unspecified open wound, right foot, subsequent encounter: Secondary | ICD-10-CM | POA: Diagnosis not present

## 2014-12-31 DIAGNOSIS — Z7982 Long term (current) use of aspirin: Secondary | ICD-10-CM | POA: Diagnosis not present

## 2014-12-31 DIAGNOSIS — Z9181 History of falling: Secondary | ICD-10-CM | POA: Diagnosis not present

## 2014-12-31 DIAGNOSIS — E1142 Type 2 diabetes mellitus with diabetic polyneuropathy: Secondary | ICD-10-CM | POA: Diagnosis not present

## 2014-12-31 DIAGNOSIS — Z794 Long term (current) use of insulin: Secondary | ICD-10-CM | POA: Diagnosis not present

## 2014-12-31 DIAGNOSIS — Z4800 Encounter for change or removal of nonsurgical wound dressing: Secondary | ICD-10-CM | POA: Diagnosis not present

## 2014-12-31 DIAGNOSIS — F329 Major depressive disorder, single episode, unspecified: Secondary | ICD-10-CM | POA: Diagnosis not present

## 2014-12-31 DIAGNOSIS — R2689 Other abnormalities of gait and mobility: Secondary | ICD-10-CM | POA: Diagnosis not present

## 2014-12-31 DIAGNOSIS — M6281 Muscle weakness (generalized): Secondary | ICD-10-CM | POA: Diagnosis not present

## 2015-01-03 DIAGNOSIS — F329 Major depressive disorder, single episode, unspecified: Secondary | ICD-10-CM | POA: Diagnosis not present

## 2015-01-03 DIAGNOSIS — Z794 Long term (current) use of insulin: Secondary | ICD-10-CM | POA: Diagnosis not present

## 2015-01-03 DIAGNOSIS — E1142 Type 2 diabetes mellitus with diabetic polyneuropathy: Secondary | ICD-10-CM | POA: Diagnosis not present

## 2015-01-03 DIAGNOSIS — R2689 Other abnormalities of gait and mobility: Secondary | ICD-10-CM | POA: Diagnosis not present

## 2015-01-03 DIAGNOSIS — Z4800 Encounter for change or removal of nonsurgical wound dressing: Secondary | ICD-10-CM | POA: Diagnosis not present

## 2015-01-03 DIAGNOSIS — M6281 Muscle weakness (generalized): Secondary | ICD-10-CM | POA: Diagnosis not present

## 2015-01-03 DIAGNOSIS — S91301D Unspecified open wound, right foot, subsequent encounter: Secondary | ICD-10-CM | POA: Diagnosis not present

## 2015-01-03 DIAGNOSIS — Z9181 History of falling: Secondary | ICD-10-CM | POA: Diagnosis not present

## 2015-01-03 DIAGNOSIS — Z7982 Long term (current) use of aspirin: Secondary | ICD-10-CM | POA: Diagnosis not present

## 2015-01-04 DIAGNOSIS — Z4800 Encounter for change or removal of nonsurgical wound dressing: Secondary | ICD-10-CM | POA: Diagnosis not present

## 2015-01-04 DIAGNOSIS — M6281 Muscle weakness (generalized): Secondary | ICD-10-CM | POA: Diagnosis not present

## 2015-01-04 DIAGNOSIS — S91301D Unspecified open wound, right foot, subsequent encounter: Secondary | ICD-10-CM | POA: Diagnosis not present

## 2015-01-04 DIAGNOSIS — E1142 Type 2 diabetes mellitus with diabetic polyneuropathy: Secondary | ICD-10-CM | POA: Diagnosis not present

## 2015-01-04 DIAGNOSIS — Z794 Long term (current) use of insulin: Secondary | ICD-10-CM | POA: Diagnosis not present

## 2015-01-04 DIAGNOSIS — Z9181 History of falling: Secondary | ICD-10-CM | POA: Diagnosis not present

## 2015-01-04 DIAGNOSIS — F329 Major depressive disorder, single episode, unspecified: Secondary | ICD-10-CM | POA: Diagnosis not present

## 2015-01-04 DIAGNOSIS — R2689 Other abnormalities of gait and mobility: Secondary | ICD-10-CM | POA: Diagnosis not present

## 2015-01-04 DIAGNOSIS — Z7982 Long term (current) use of aspirin: Secondary | ICD-10-CM | POA: Diagnosis not present

## 2015-01-07 DIAGNOSIS — Z7982 Long term (current) use of aspirin: Secondary | ICD-10-CM | POA: Diagnosis not present

## 2015-01-07 DIAGNOSIS — Z9181 History of falling: Secondary | ICD-10-CM | POA: Diagnosis not present

## 2015-01-07 DIAGNOSIS — M6281 Muscle weakness (generalized): Secondary | ICD-10-CM | POA: Diagnosis not present

## 2015-01-07 DIAGNOSIS — S91301D Unspecified open wound, right foot, subsequent encounter: Secondary | ICD-10-CM | POA: Diagnosis not present

## 2015-01-07 DIAGNOSIS — Z794 Long term (current) use of insulin: Secondary | ICD-10-CM | POA: Diagnosis not present

## 2015-01-07 DIAGNOSIS — E1142 Type 2 diabetes mellitus with diabetic polyneuropathy: Secondary | ICD-10-CM | POA: Diagnosis not present

## 2015-01-07 DIAGNOSIS — Z4800 Encounter for change or removal of nonsurgical wound dressing: Secondary | ICD-10-CM | POA: Diagnosis not present

## 2015-01-07 DIAGNOSIS — F329 Major depressive disorder, single episode, unspecified: Secondary | ICD-10-CM | POA: Diagnosis not present

## 2015-01-07 DIAGNOSIS — R2689 Other abnormalities of gait and mobility: Secondary | ICD-10-CM | POA: Diagnosis not present

## 2015-01-09 DIAGNOSIS — Z9181 History of falling: Secondary | ICD-10-CM | POA: Diagnosis not present

## 2015-01-09 DIAGNOSIS — Z7982 Long term (current) use of aspirin: Secondary | ICD-10-CM | POA: Diagnosis not present

## 2015-01-09 DIAGNOSIS — Z794 Long term (current) use of insulin: Secondary | ICD-10-CM | POA: Diagnosis not present

## 2015-01-09 DIAGNOSIS — Z4800 Encounter for change or removal of nonsurgical wound dressing: Secondary | ICD-10-CM | POA: Diagnosis not present

## 2015-01-09 DIAGNOSIS — M6281 Muscle weakness (generalized): Secondary | ICD-10-CM | POA: Diagnosis not present

## 2015-01-09 DIAGNOSIS — F329 Major depressive disorder, single episode, unspecified: Secondary | ICD-10-CM | POA: Diagnosis not present

## 2015-01-09 DIAGNOSIS — R2689 Other abnormalities of gait and mobility: Secondary | ICD-10-CM | POA: Diagnosis not present

## 2015-01-09 DIAGNOSIS — E1142 Type 2 diabetes mellitus with diabetic polyneuropathy: Secondary | ICD-10-CM | POA: Diagnosis not present

## 2015-01-09 DIAGNOSIS — S91301D Unspecified open wound, right foot, subsequent encounter: Secondary | ICD-10-CM | POA: Diagnosis not present

## 2015-01-10 DIAGNOSIS — M6281 Muscle weakness (generalized): Secondary | ICD-10-CM | POA: Diagnosis not present

## 2015-01-10 DIAGNOSIS — Z794 Long term (current) use of insulin: Secondary | ICD-10-CM | POA: Diagnosis not present

## 2015-01-10 DIAGNOSIS — Z4800 Encounter for change or removal of nonsurgical wound dressing: Secondary | ICD-10-CM | POA: Diagnosis not present

## 2015-01-10 DIAGNOSIS — F329 Major depressive disorder, single episode, unspecified: Secondary | ICD-10-CM | POA: Diagnosis not present

## 2015-01-10 DIAGNOSIS — R2689 Other abnormalities of gait and mobility: Secondary | ICD-10-CM | POA: Diagnosis not present

## 2015-01-10 DIAGNOSIS — Z7982 Long term (current) use of aspirin: Secondary | ICD-10-CM | POA: Diagnosis not present

## 2015-01-10 DIAGNOSIS — S91301D Unspecified open wound, right foot, subsequent encounter: Secondary | ICD-10-CM | POA: Diagnosis not present

## 2015-01-10 DIAGNOSIS — Z9181 History of falling: Secondary | ICD-10-CM | POA: Diagnosis not present

## 2015-01-10 DIAGNOSIS — E1142 Type 2 diabetes mellitus with diabetic polyneuropathy: Secondary | ICD-10-CM | POA: Diagnosis not present

## 2015-01-14 DIAGNOSIS — F329 Major depressive disorder, single episode, unspecified: Secondary | ICD-10-CM | POA: Diagnosis not present

## 2015-01-14 DIAGNOSIS — M6281 Muscle weakness (generalized): Secondary | ICD-10-CM | POA: Diagnosis not present

## 2015-01-14 DIAGNOSIS — E1142 Type 2 diabetes mellitus with diabetic polyneuropathy: Secondary | ICD-10-CM | POA: Diagnosis not present

## 2015-01-14 DIAGNOSIS — S91301D Unspecified open wound, right foot, subsequent encounter: Secondary | ICD-10-CM | POA: Diagnosis not present

## 2015-01-14 DIAGNOSIS — R2689 Other abnormalities of gait and mobility: Secondary | ICD-10-CM | POA: Diagnosis not present

## 2015-01-14 DIAGNOSIS — Z9181 History of falling: Secondary | ICD-10-CM | POA: Diagnosis not present

## 2015-01-14 DIAGNOSIS — Z7982 Long term (current) use of aspirin: Secondary | ICD-10-CM | POA: Diagnosis not present

## 2015-01-14 DIAGNOSIS — Z4800 Encounter for change or removal of nonsurgical wound dressing: Secondary | ICD-10-CM | POA: Diagnosis not present

## 2015-01-14 DIAGNOSIS — Z794 Long term (current) use of insulin: Secondary | ICD-10-CM | POA: Diagnosis not present

## 2015-01-17 DIAGNOSIS — R2689 Other abnormalities of gait and mobility: Secondary | ICD-10-CM | POA: Diagnosis not present

## 2015-01-17 DIAGNOSIS — Z794 Long term (current) use of insulin: Secondary | ICD-10-CM | POA: Diagnosis not present

## 2015-01-17 DIAGNOSIS — F329 Major depressive disorder, single episode, unspecified: Secondary | ICD-10-CM | POA: Diagnosis not present

## 2015-01-17 DIAGNOSIS — Z7982 Long term (current) use of aspirin: Secondary | ICD-10-CM | POA: Diagnosis not present

## 2015-01-17 DIAGNOSIS — M6281 Muscle weakness (generalized): Secondary | ICD-10-CM | POA: Diagnosis not present

## 2015-01-17 DIAGNOSIS — E1142 Type 2 diabetes mellitus with diabetic polyneuropathy: Secondary | ICD-10-CM | POA: Diagnosis not present

## 2015-01-17 DIAGNOSIS — Z9181 History of falling: Secondary | ICD-10-CM | POA: Diagnosis not present

## 2015-01-17 DIAGNOSIS — S91301D Unspecified open wound, right foot, subsequent encounter: Secondary | ICD-10-CM | POA: Diagnosis not present

## 2015-01-17 DIAGNOSIS — Z4800 Encounter for change or removal of nonsurgical wound dressing: Secondary | ICD-10-CM | POA: Diagnosis not present

## 2015-01-18 DIAGNOSIS — Z794 Long term (current) use of insulin: Secondary | ICD-10-CM | POA: Diagnosis not present

## 2015-01-18 DIAGNOSIS — Z9181 History of falling: Secondary | ICD-10-CM | POA: Diagnosis not present

## 2015-01-18 DIAGNOSIS — M6281 Muscle weakness (generalized): Secondary | ICD-10-CM | POA: Diagnosis not present

## 2015-01-18 DIAGNOSIS — Z4800 Encounter for change or removal of nonsurgical wound dressing: Secondary | ICD-10-CM | POA: Diagnosis not present

## 2015-01-18 DIAGNOSIS — R2689 Other abnormalities of gait and mobility: Secondary | ICD-10-CM | POA: Diagnosis not present

## 2015-01-18 DIAGNOSIS — S91301D Unspecified open wound, right foot, subsequent encounter: Secondary | ICD-10-CM | POA: Diagnosis not present

## 2015-01-18 DIAGNOSIS — Z7982 Long term (current) use of aspirin: Secondary | ICD-10-CM | POA: Diagnosis not present

## 2015-01-18 DIAGNOSIS — F329 Major depressive disorder, single episode, unspecified: Secondary | ICD-10-CM | POA: Diagnosis not present

## 2015-01-18 DIAGNOSIS — E1142 Type 2 diabetes mellitus with diabetic polyneuropathy: Secondary | ICD-10-CM | POA: Diagnosis not present

## 2015-01-19 DIAGNOSIS — Z794 Long term (current) use of insulin: Secondary | ICD-10-CM | POA: Diagnosis not present

## 2015-01-19 DIAGNOSIS — Z7982 Long term (current) use of aspirin: Secondary | ICD-10-CM | POA: Diagnosis not present

## 2015-01-19 DIAGNOSIS — R2689 Other abnormalities of gait and mobility: Secondary | ICD-10-CM | POA: Diagnosis not present

## 2015-01-19 DIAGNOSIS — Z4800 Encounter for change or removal of nonsurgical wound dressing: Secondary | ICD-10-CM | POA: Diagnosis not present

## 2015-01-19 DIAGNOSIS — E1142 Type 2 diabetes mellitus with diabetic polyneuropathy: Secondary | ICD-10-CM | POA: Diagnosis not present

## 2015-01-19 DIAGNOSIS — F329 Major depressive disorder, single episode, unspecified: Secondary | ICD-10-CM | POA: Diagnosis not present

## 2015-01-19 DIAGNOSIS — M6281 Muscle weakness (generalized): Secondary | ICD-10-CM | POA: Diagnosis not present

## 2015-01-19 DIAGNOSIS — S91301D Unspecified open wound, right foot, subsequent encounter: Secondary | ICD-10-CM | POA: Diagnosis not present

## 2015-01-19 DIAGNOSIS — Z9181 History of falling: Secondary | ICD-10-CM | POA: Diagnosis not present

## 2015-01-20 DIAGNOSIS — Z9181 History of falling: Secondary | ICD-10-CM | POA: Diagnosis not present

## 2015-01-20 DIAGNOSIS — R2689 Other abnormalities of gait and mobility: Secondary | ICD-10-CM | POA: Diagnosis not present

## 2015-01-20 DIAGNOSIS — Z7982 Long term (current) use of aspirin: Secondary | ICD-10-CM | POA: Diagnosis not present

## 2015-01-20 DIAGNOSIS — S91301D Unspecified open wound, right foot, subsequent encounter: Secondary | ICD-10-CM | POA: Diagnosis not present

## 2015-01-20 DIAGNOSIS — F329 Major depressive disorder, single episode, unspecified: Secondary | ICD-10-CM | POA: Diagnosis not present

## 2015-01-20 DIAGNOSIS — M6281 Muscle weakness (generalized): Secondary | ICD-10-CM | POA: Diagnosis not present

## 2015-01-20 DIAGNOSIS — Z794 Long term (current) use of insulin: Secondary | ICD-10-CM | POA: Diagnosis not present

## 2015-01-20 DIAGNOSIS — Z4800 Encounter for change or removal of nonsurgical wound dressing: Secondary | ICD-10-CM | POA: Diagnosis not present

## 2015-01-20 DIAGNOSIS — E1142 Type 2 diabetes mellitus with diabetic polyneuropathy: Secondary | ICD-10-CM | POA: Diagnosis not present

## 2015-01-21 DIAGNOSIS — E1142 Type 2 diabetes mellitus with diabetic polyneuropathy: Secondary | ICD-10-CM | POA: Diagnosis not present

## 2015-01-21 DIAGNOSIS — Z4800 Encounter for change or removal of nonsurgical wound dressing: Secondary | ICD-10-CM | POA: Diagnosis not present

## 2015-01-21 DIAGNOSIS — Z9181 History of falling: Secondary | ICD-10-CM | POA: Diagnosis not present

## 2015-01-21 DIAGNOSIS — R2689 Other abnormalities of gait and mobility: Secondary | ICD-10-CM | POA: Diagnosis not present

## 2015-01-21 DIAGNOSIS — Z794 Long term (current) use of insulin: Secondary | ICD-10-CM | POA: Diagnosis not present

## 2015-01-21 DIAGNOSIS — F329 Major depressive disorder, single episode, unspecified: Secondary | ICD-10-CM | POA: Diagnosis not present

## 2015-01-21 DIAGNOSIS — S91301D Unspecified open wound, right foot, subsequent encounter: Secondary | ICD-10-CM | POA: Diagnosis not present

## 2015-01-21 DIAGNOSIS — Z7982 Long term (current) use of aspirin: Secondary | ICD-10-CM | POA: Diagnosis not present

## 2015-01-21 DIAGNOSIS — M6281 Muscle weakness (generalized): Secondary | ICD-10-CM | POA: Diagnosis not present

## 2015-01-22 DIAGNOSIS — Z794 Long term (current) use of insulin: Secondary | ICD-10-CM | POA: Diagnosis not present

## 2015-01-22 DIAGNOSIS — Z4800 Encounter for change or removal of nonsurgical wound dressing: Secondary | ICD-10-CM | POA: Diagnosis not present

## 2015-01-22 DIAGNOSIS — Z7982 Long term (current) use of aspirin: Secondary | ICD-10-CM | POA: Diagnosis not present

## 2015-01-22 DIAGNOSIS — T8189XA Other complications of procedures, not elsewhere classified, initial encounter: Secondary | ICD-10-CM | POA: Diagnosis not present

## 2015-01-22 DIAGNOSIS — E1142 Type 2 diabetes mellitus with diabetic polyneuropathy: Secondary | ICD-10-CM | POA: Diagnosis not present

## 2015-01-22 DIAGNOSIS — Z9181 History of falling: Secondary | ICD-10-CM | POA: Diagnosis not present

## 2015-01-22 DIAGNOSIS — R2689 Other abnormalities of gait and mobility: Secondary | ICD-10-CM | POA: Diagnosis not present

## 2015-01-22 DIAGNOSIS — S91301D Unspecified open wound, right foot, subsequent encounter: Secondary | ICD-10-CM | POA: Diagnosis not present

## 2015-01-22 DIAGNOSIS — F329 Major depressive disorder, single episode, unspecified: Secondary | ICD-10-CM | POA: Diagnosis not present

## 2015-01-22 DIAGNOSIS — M6281 Muscle weakness (generalized): Secondary | ICD-10-CM | POA: Diagnosis not present

## 2015-01-23 DIAGNOSIS — Z7982 Long term (current) use of aspirin: Secondary | ICD-10-CM | POA: Diagnosis not present

## 2015-01-23 DIAGNOSIS — Z9181 History of falling: Secondary | ICD-10-CM | POA: Diagnosis not present

## 2015-01-23 DIAGNOSIS — F329 Major depressive disorder, single episode, unspecified: Secondary | ICD-10-CM | POA: Diagnosis not present

## 2015-01-23 DIAGNOSIS — E1142 Type 2 diabetes mellitus with diabetic polyneuropathy: Secondary | ICD-10-CM | POA: Diagnosis not present

## 2015-01-23 DIAGNOSIS — M6281 Muscle weakness (generalized): Secondary | ICD-10-CM | POA: Diagnosis not present

## 2015-01-23 DIAGNOSIS — S91301D Unspecified open wound, right foot, subsequent encounter: Secondary | ICD-10-CM | POA: Diagnosis not present

## 2015-01-23 DIAGNOSIS — R2689 Other abnormalities of gait and mobility: Secondary | ICD-10-CM | POA: Diagnosis not present

## 2015-01-23 DIAGNOSIS — Z794 Long term (current) use of insulin: Secondary | ICD-10-CM | POA: Diagnosis not present

## 2015-01-23 DIAGNOSIS — Z4800 Encounter for change or removal of nonsurgical wound dressing: Secondary | ICD-10-CM | POA: Diagnosis not present

## 2015-01-24 DIAGNOSIS — E1142 Type 2 diabetes mellitus with diabetic polyneuropathy: Secondary | ICD-10-CM | POA: Diagnosis not present

## 2015-01-24 DIAGNOSIS — F329 Major depressive disorder, single episode, unspecified: Secondary | ICD-10-CM | POA: Diagnosis not present

## 2015-01-24 DIAGNOSIS — Z9181 History of falling: Secondary | ICD-10-CM | POA: Diagnosis not present

## 2015-01-24 DIAGNOSIS — R2689 Other abnormalities of gait and mobility: Secondary | ICD-10-CM | POA: Diagnosis not present

## 2015-01-24 DIAGNOSIS — Z4800 Encounter for change or removal of nonsurgical wound dressing: Secondary | ICD-10-CM | POA: Diagnosis not present

## 2015-01-24 DIAGNOSIS — Z794 Long term (current) use of insulin: Secondary | ICD-10-CM | POA: Diagnosis not present

## 2015-01-24 DIAGNOSIS — M6281 Muscle weakness (generalized): Secondary | ICD-10-CM | POA: Diagnosis not present

## 2015-01-24 DIAGNOSIS — Z7982 Long term (current) use of aspirin: Secondary | ICD-10-CM | POA: Diagnosis not present

## 2015-01-24 DIAGNOSIS — S91301D Unspecified open wound, right foot, subsequent encounter: Secondary | ICD-10-CM | POA: Diagnosis not present

## 2015-01-25 DIAGNOSIS — R2689 Other abnormalities of gait and mobility: Secondary | ICD-10-CM | POA: Diagnosis not present

## 2015-01-25 DIAGNOSIS — Z7982 Long term (current) use of aspirin: Secondary | ICD-10-CM | POA: Diagnosis not present

## 2015-01-25 DIAGNOSIS — Z794 Long term (current) use of insulin: Secondary | ICD-10-CM | POA: Diagnosis not present

## 2015-01-25 DIAGNOSIS — F329 Major depressive disorder, single episode, unspecified: Secondary | ICD-10-CM | POA: Diagnosis not present

## 2015-01-25 DIAGNOSIS — Z9181 History of falling: Secondary | ICD-10-CM | POA: Diagnosis not present

## 2015-01-25 DIAGNOSIS — S91301D Unspecified open wound, right foot, subsequent encounter: Secondary | ICD-10-CM | POA: Diagnosis not present

## 2015-01-25 DIAGNOSIS — Z48817 Encounter for surgical aftercare following surgery on the skin and subcutaneous tissue: Secondary | ICD-10-CM | POA: Diagnosis not present

## 2015-01-25 DIAGNOSIS — Z4800 Encounter for change or removal of nonsurgical wound dressing: Secondary | ICD-10-CM | POA: Diagnosis not present

## 2015-01-25 DIAGNOSIS — M6281 Muscle weakness (generalized): Secondary | ICD-10-CM | POA: Diagnosis not present

## 2015-01-25 DIAGNOSIS — E1142 Type 2 diabetes mellitus with diabetic polyneuropathy: Secondary | ICD-10-CM | POA: Diagnosis not present

## 2015-01-27 DIAGNOSIS — Z794 Long term (current) use of insulin: Secondary | ICD-10-CM | POA: Diagnosis not present

## 2015-01-27 DIAGNOSIS — Z9181 History of falling: Secondary | ICD-10-CM | POA: Diagnosis not present

## 2015-01-27 DIAGNOSIS — Z4800 Encounter for change or removal of nonsurgical wound dressing: Secondary | ICD-10-CM | POA: Diagnosis not present

## 2015-01-27 DIAGNOSIS — Z48817 Encounter for surgical aftercare following surgery on the skin and subcutaneous tissue: Secondary | ICD-10-CM | POA: Diagnosis not present

## 2015-01-27 DIAGNOSIS — E1142 Type 2 diabetes mellitus with diabetic polyneuropathy: Secondary | ICD-10-CM | POA: Diagnosis not present

## 2015-01-27 DIAGNOSIS — F329 Major depressive disorder, single episode, unspecified: Secondary | ICD-10-CM | POA: Diagnosis not present

## 2015-01-27 DIAGNOSIS — Z7982 Long term (current) use of aspirin: Secondary | ICD-10-CM | POA: Diagnosis not present

## 2015-01-27 DIAGNOSIS — S91301D Unspecified open wound, right foot, subsequent encounter: Secondary | ICD-10-CM | POA: Diagnosis not present

## 2015-01-28 DIAGNOSIS — Z4800 Encounter for change or removal of nonsurgical wound dressing: Secondary | ICD-10-CM | POA: Diagnosis not present

## 2015-01-28 DIAGNOSIS — S91301D Unspecified open wound, right foot, subsequent encounter: Secondary | ICD-10-CM | POA: Diagnosis not present

## 2015-01-28 DIAGNOSIS — Z7982 Long term (current) use of aspirin: Secondary | ICD-10-CM | POA: Diagnosis not present

## 2015-01-28 DIAGNOSIS — F329 Major depressive disorder, single episode, unspecified: Secondary | ICD-10-CM | POA: Diagnosis not present

## 2015-01-28 DIAGNOSIS — Z794 Long term (current) use of insulin: Secondary | ICD-10-CM | POA: Diagnosis not present

## 2015-01-28 DIAGNOSIS — Z9181 History of falling: Secondary | ICD-10-CM | POA: Diagnosis not present

## 2015-01-28 DIAGNOSIS — Z48817 Encounter for surgical aftercare following surgery on the skin and subcutaneous tissue: Secondary | ICD-10-CM | POA: Diagnosis not present

## 2015-01-28 DIAGNOSIS — E1142 Type 2 diabetes mellitus with diabetic polyneuropathy: Secondary | ICD-10-CM | POA: Diagnosis not present

## 2015-01-31 DIAGNOSIS — S91301D Unspecified open wound, right foot, subsequent encounter: Secondary | ICD-10-CM | POA: Diagnosis not present

## 2015-01-31 DIAGNOSIS — Z48817 Encounter for surgical aftercare following surgery on the skin and subcutaneous tissue: Secondary | ICD-10-CM | POA: Diagnosis not present

## 2015-01-31 DIAGNOSIS — Z794 Long term (current) use of insulin: Secondary | ICD-10-CM | POA: Diagnosis not present

## 2015-01-31 DIAGNOSIS — Z9181 History of falling: Secondary | ICD-10-CM | POA: Diagnosis not present

## 2015-01-31 DIAGNOSIS — F329 Major depressive disorder, single episode, unspecified: Secondary | ICD-10-CM | POA: Diagnosis not present

## 2015-01-31 DIAGNOSIS — E1142 Type 2 diabetes mellitus with diabetic polyneuropathy: Secondary | ICD-10-CM | POA: Diagnosis not present

## 2015-01-31 DIAGNOSIS — Z7982 Long term (current) use of aspirin: Secondary | ICD-10-CM | POA: Diagnosis not present

## 2015-01-31 DIAGNOSIS — Z4800 Encounter for change or removal of nonsurgical wound dressing: Secondary | ICD-10-CM | POA: Diagnosis not present

## 2015-02-19 DIAGNOSIS — S91301S Unspecified open wound, right foot, sequela: Secondary | ICD-10-CM | POA: Diagnosis not present

## 2015-02-19 DIAGNOSIS — E11621 Type 2 diabetes mellitus with foot ulcer: Secondary | ICD-10-CM | POA: Diagnosis not present

## 2015-02-19 DIAGNOSIS — L97519 Non-pressure chronic ulcer of other part of right foot with unspecified severity: Secondary | ICD-10-CM | POA: Diagnosis not present

## 2015-05-09 DIAGNOSIS — S91301S Unspecified open wound, right foot, sequela: Secondary | ICD-10-CM | POA: Diagnosis not present

## 2015-05-09 DIAGNOSIS — B359 Dermatophytosis, unspecified: Secondary | ICD-10-CM | POA: Diagnosis not present

## 2015-08-08 ENCOUNTER — Telehealth: Payer: Self-pay | Admitting: Family Medicine

## 2015-08-08 NOTE — Telephone Encounter (Signed)
He does not have to do a wellness visit but he does need an appt roughly annual to keep prescriptions current and check some simple lab work. Please let him know

## 2015-08-08 NOTE — Telephone Encounter (Signed)
Caller name: Nyree  Relation to pt: self Call back number: [phone] Pharmacy:  Reason for call: Pt came in office stating that does not want to do a Wellness Visit for this year. Just for FYI for PCP.

## 2015-08-09 NOTE — Telephone Encounter (Signed)
LVM for pt to return call to schedule an annual physical appt.

## 2015-08-12 ENCOUNTER — Telehealth: Payer: Self-pay | Admitting: Family Medicine

## 2015-08-12 NOTE — Telephone Encounter (Signed)
error 

## 2015-12-03 NOTE — Telephone Encounter (Signed)
Called pt to schedule appt w/ PCP. Pt states he gets all of his healthcare through the [place] and has no need to see Dr. Charlett Blake as he has no concerns for her that are not already covered by other providers. States he previously discussed this w/ Dr. Charlett Blake. He requested that we do not continue to call him. Okay to remove you as PCP?

## 2015-12-04 NOTE — Telephone Encounter (Signed)
Yes please

## 2015-12-05 NOTE — Telephone Encounter (Signed)
Done

## 2016-09-19 DIAGNOSIS — N481 Balanitis: Secondary | ICD-10-CM | POA: Diagnosis not present

## 2016-09-19 DIAGNOSIS — G479 Sleep disorder, unspecified: Secondary | ICD-10-CM | POA: Diagnosis not present

## 2016-09-19 DIAGNOSIS — Z87891 Personal history of nicotine dependence: Secondary | ICD-10-CM | POA: Diagnosis not present

## 2016-09-19 DIAGNOSIS — N4889 Other specified disorders of penis: Secondary | ICD-10-CM | POA: Diagnosis not present

## 2016-09-19 DIAGNOSIS — I440 Atrioventricular block, first degree: Secondary | ICD-10-CM | POA: Diagnosis not present

## 2016-09-19 DIAGNOSIS — Z8673 Personal history of transient ischemic attack (TIA), and cerebral infarction without residual deficits: Secondary | ICD-10-CM | POA: Diagnosis not present

## 2016-09-19 DIAGNOSIS — R5383 Other fatigue: Secondary | ICD-10-CM | POA: Diagnosis not present

## 2016-09-19 DIAGNOSIS — I2510 Atherosclerotic heart disease of native coronary artery without angina pectoris: Secondary | ICD-10-CM | POA: Diagnosis not present

## 2016-09-19 DIAGNOSIS — R1030 Lower abdominal pain, unspecified: Secondary | ICD-10-CM | POA: Diagnosis not present

## 2016-09-19 DIAGNOSIS — Z72820 Sleep deprivation: Secondary | ICD-10-CM | POA: Diagnosis not present

## 2016-09-19 DIAGNOSIS — R001 Bradycardia, unspecified: Secondary | ICD-10-CM | POA: Diagnosis not present

## 2016-09-20 DIAGNOSIS — I440 Atrioventricular block, first degree: Secondary | ICD-10-CM | POA: Diagnosis not present

## 2016-09-28 DIAGNOSIS — E119 Type 2 diabetes mellitus without complications: Secondary | ICD-10-CM | POA: Insufficient documentation

## 2016-09-28 DIAGNOSIS — Z8673 Personal history of transient ischemic attack (TIA), and cerebral infarction without residual deficits: Secondary | ICD-10-CM | POA: Insufficient documentation

## 2016-09-28 DIAGNOSIS — C61 Malignant neoplasm of prostate: Secondary | ICD-10-CM | POA: Insufficient documentation

## 2016-09-28 DIAGNOSIS — N400 Enlarged prostate without lower urinary tract symptoms: Secondary | ICD-10-CM | POA: Diagnosis not present

## 2016-10-13 DIAGNOSIS — I2510 Atherosclerotic heart disease of native coronary artery without angina pectoris: Secondary | ICD-10-CM | POA: Diagnosis not present

## 2016-10-13 DIAGNOSIS — I10 Essential (primary) hypertension: Secondary | ICD-10-CM | POA: Diagnosis not present

## 2016-10-13 DIAGNOSIS — Z8673 Personal history of transient ischemic attack (TIA), and cerebral infarction without residual deficits: Secondary | ICD-10-CM | POA: Diagnosis not present

## 2016-10-13 DIAGNOSIS — N401 Enlarged prostate with lower urinary tract symptoms: Secondary | ICD-10-CM | POA: Diagnosis not present

## 2016-10-13 DIAGNOSIS — E119 Type 2 diabetes mellitus without complications: Secondary | ICD-10-CM | POA: Diagnosis not present

## 2016-10-13 DIAGNOSIS — Z794 Long term (current) use of insulin: Secondary | ICD-10-CM | POA: Diagnosis not present

## 2016-10-13 DIAGNOSIS — C61 Malignant neoplasm of prostate: Secondary | ICD-10-CM | POA: Diagnosis not present

## 2016-10-13 DIAGNOSIS — R3915 Urgency of urination: Secondary | ICD-10-CM | POA: Diagnosis not present

## 2016-10-13 DIAGNOSIS — Z87891 Personal history of nicotine dependence: Secondary | ICD-10-CM | POA: Diagnosis not present

## 2016-10-14 DIAGNOSIS — C61 Malignant neoplasm of prostate: Secondary | ICD-10-CM | POA: Diagnosis not present

## 2016-10-14 DIAGNOSIS — N401 Enlarged prostate with lower urinary tract symptoms: Secondary | ICD-10-CM | POA: Diagnosis not present

## 2016-10-14 DIAGNOSIS — R3915 Urgency of urination: Secondary | ICD-10-CM | POA: Diagnosis not present

## 2016-10-14 DIAGNOSIS — Z794 Long term (current) use of insulin: Secondary | ICD-10-CM | POA: Diagnosis not present

## 2016-10-14 DIAGNOSIS — E119 Type 2 diabetes mellitus without complications: Secondary | ICD-10-CM | POA: Diagnosis not present

## 2016-10-14 DIAGNOSIS — Z87891 Personal history of nicotine dependence: Secondary | ICD-10-CM | POA: Diagnosis not present

## 2016-10-14 DIAGNOSIS — Z8673 Personal history of transient ischemic attack (TIA), and cerebral infarction without residual deficits: Secondary | ICD-10-CM | POA: Diagnosis not present

## 2016-10-14 DIAGNOSIS — I2510 Atherosclerotic heart disease of native coronary artery without angina pectoris: Secondary | ICD-10-CM | POA: Diagnosis not present

## 2016-10-14 DIAGNOSIS — I10 Essential (primary) hypertension: Secondary | ICD-10-CM | POA: Diagnosis not present

## 2016-10-20 DIAGNOSIS — R3915 Urgency of urination: Secondary | ICD-10-CM | POA: Diagnosis not present

## 2016-10-20 DIAGNOSIS — R310 Gross hematuria: Secondary | ICD-10-CM | POA: Diagnosis not present

## 2017-03-05 DIAGNOSIS — N528 Other male erectile dysfunction: Secondary | ICD-10-CM | POA: Diagnosis not present

## 2017-03-05 DIAGNOSIS — C61 Malignant neoplasm of prostate: Secondary | ICD-10-CM | POA: Diagnosis not present

## 2017-05-07 DIAGNOSIS — C61 Malignant neoplasm of prostate: Secondary | ICD-10-CM | POA: Diagnosis not present

## 2017-05-07 DIAGNOSIS — N528 Other male erectile dysfunction: Secondary | ICD-10-CM | POA: Diagnosis not present

## 2017-05-18 DIAGNOSIS — N529 Male erectile dysfunction, unspecified: Secondary | ICD-10-CM | POA: Insufficient documentation

## 2017-09-21 DIAGNOSIS — M7918 Myalgia, other site: Secondary | ICD-10-CM | POA: Insufficient documentation

## 2017-09-21 DIAGNOSIS — M542 Cervicalgia: Secondary | ICD-10-CM | POA: Insufficient documentation

## 2017-09-21 DIAGNOSIS — M5412 Radiculopathy, cervical region: Secondary | ICD-10-CM | POA: Insufficient documentation

## 2017-12-13 ENCOUNTER — Telehealth: Payer: Self-pay

## 2017-12-13 NOTE — Telephone Encounter (Signed)
Attempted to reach patient to schedule Medicare AWV but no PCP listed and no VM set up to leave a message

## 2017-12-14 ENCOUNTER — Ambulatory Visit (INDEPENDENT_AMBULATORY_CARE_PROVIDER_SITE_OTHER): Payer: Medicare HMO | Admitting: Sports Medicine

## 2017-12-14 ENCOUNTER — Encounter: Payer: Self-pay | Admitting: Sports Medicine

## 2017-12-14 DIAGNOSIS — E11621 Type 2 diabetes mellitus with foot ulcer: Secondary | ICD-10-CM

## 2017-12-14 DIAGNOSIS — L97519 Non-pressure chronic ulcer of other part of right foot with unspecified severity: Secondary | ICD-10-CM

## 2017-12-14 DIAGNOSIS — L97412 Non-pressure chronic ulcer of right heel and midfoot with fat layer exposed: Secondary | ICD-10-CM | POA: Diagnosis not present

## 2017-12-14 DIAGNOSIS — M19041 Primary osteoarthritis, right hand: Secondary | ICD-10-CM | POA: Diagnosis not present

## 2017-12-14 MED ORDER — DIAZEPAM 5 MG PO TABS: ORAL_TABLET | ORAL | 0 refills | Status: DC

## 2017-12-14 NOTE — Assessment & Plan Note (Addendum)
We are going to start a full evaluation. Wound culture, unable to feel the dorsalis pedis pulse, adding an MR angiogram. Referral to wound care at [facility]. Hydrocolloid dressing applied today.  Continue aggressive wound care at [facility], awaiting arterial imaging, cultures are growing out pseudomonas aeruginosa, this is going to need a course of ciprofloxacin, antibiotics called in.

## 2017-12-14 NOTE — Assessment & Plan Note (Signed)
Third MCP injection. 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

## 2017-12-14 NOTE — Progress Notes (Addendum)
Subjective:    CC: Foot pain, hand pain  HPI:  This is a pleasant 72 year old male, for years he has had a slow to heal wound on the plantar aspect of his right foot.  He had some skin grafting years ago, and has had some wound care since.  Continues to have an open ulcer.  There is no history of revascularization, or imaging of the lower extremity.  History of controlled diabetes.  Hand pain: Right-sided, third MCP.  Moderate, persistent without radiation.  I reviewed the past medical history, family history, social history, surgical history, and allergies today and no changes were needed.  Please see the problem list section below in epic for further details.  Past Medical History: Past Medical History:  Diagnosis Date  . Acute upper respiratory infection 02/18/2014  . Allergy   . Arthritis   . Carotid artery disease ([place]) 05/11/2013  . Chicken pox as a child  . Diabetes mellitus without complication ([place])   . Fall    every since his stroke  . GERD (gastroesophageal reflux disease)   . Measles   . Other and unspecified hyperlipidemia 05/11/2013  . Peripheral neuropathy   . Poor dental hygiene 05/11/2013  . Shingles 72 yrs old  . Sleep apnea    [facility]  . Stroke [facility]) 2007  . Vocal cord dysfunction    Past Surgical History: Past Surgical History:  Procedure Laterality Date  . APPENDECTOMY  2000   ruptured  . CAROTID ENDARTERECTOMY  09/2010  . EYE SURGERY  2010   cataracts bilateral   Social History: Social History   Socioeconomic History  . Marital status: Divorced    Spouse name: Not on file  . Number of children: Not on file  . Years of education: Not on file  . Highest education level: Not on file  Occupational History  . Occupation: [employer]  . Financial resource strain: Not on file  . Food insecurity:    Worry: Not on file    Inability: Not on file  . Transportation needs:    Medical: Not on file    Non-medical: Not on file    Tobacco Use  . Smoking status: Former Smoker    Years: 10.00    Types: Cigars    Start date: 11/27/1997  . Smokeless tobacco: Never Used  Substance and Sexual Activity  . Alcohol use: Yes    Alcohol/week: 0.0 standard drinks    Comment: occasionally drinks wine   . Drug use: No  . Sexual activity: Yes    Partners: Female    Comment: lives with roommate, minimal carbs  Lifestyle  . Physical activity:    Days per week: Not on file    Minutes per session: Not on file  . Stress: Not on file  Relationships  . Social connections:    Talks on phone: Not on file    Gets together: Not on file    Attends religious service: Not on file    Active member of club or organization: Not on file    Attends meetings of clubs or organizations: Not on file    Relationship status: Not on file  Other Topics Concern  . Not on file  Social History Narrative  . Not on file   Family History: Family History  Problem Relation Age of Onset  . Cancer Mother        kidney  . Heart attack Father   . Stroke  Brother   . Heart attack Brother   . Stroke Brother    Allergies: Allergies  Allergen Reactions  . Aggrenox [Aspirin-Dipyridamole Er] Shortness Of Breath   Medications: See med rec.  Review of Systems: No headache, visual changes, nausea, vomiting, diarrhea, constipation, dizziness, abdominal pain, skin rash, fevers, chills, night sweats, swollen lymph nodes, weight loss, chest pain, body aches, joint swelling, muscle aches, shortness of breath, mood changes, visual or auditory hallucinations.  Objective:    General: Well Developed, well nourished, and in no acute distress.  Neuro: Alert and oriented x3, extra-ocular muscles intact, sensation grossly intact.  HEENT: Normocephalic, atraumatic, pupils equal round reactive to light, neck supple, no masses, no lymphadenopathy, thyroid nonpalpable.  Skin: Warm and dry, no rashes noted.  Cardiac: Regular rate and rhythm, no murmurs rubs or  gallops.  Respiratory: Clear to auscultation bilaterally. Not using accessory muscles, speaking in full sentences.  Abdominal: Soft, nontender, nondistended, positive bowel sounds, no masses, no organomegaly.  Right hand: Swollen third MCP.  Tender to palpation. Right foot: There is a 3 to 4 cm diabetic ulcer on the plantar aspect of the heel, deep into the subcutaneous tissues.  No exposed bone or muscle.  There is a bit of overlying granulation tissue and some nonviable skin.  I am unable to palpate his dorsalis pedis artery, his posterior tibial artery is minimally palpable.  Procedure: Real-time Ultrasound Guided Injection of right third MCP Device: GE Logiq E  Verbal informed consent obtained.  Time-out conducted.  Noted no overlying erythema, induration, or other signs of local infection.  Skin prepped in a sterile fashion.  Local anesthesia: Topical Ethyl chloride.  With sterile technique and under real time ultrasound guidance: Noted copious synovitis, 1/2 cc kenalog 40, 1/2 cc lidocaine injected easily Completed without difficulty  Pain immediately resolved suggesting accurate placement of the medication.  Advised to call if fevers/chills, erythema, induration, drainage, or persistent bleeding.  Images permanently stored and available for review in the ultrasound unit.  Impression: Technically successful ultrasound guided injection.  X-rays of the hand personally reviewed, no acute fractures, there is severe, diffuse osteoarthritis and nearly every joint of his hand.  Impression and Recommendations:    The patient was counselled, risk factors were discussed, anticipatory guidance given.  Primary osteoarthritis of right hand Third MCP injection. 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  Diabetic ulcer of right foot ([place]) We are going to start a full evaluation. Wound culture, unable to feel the dorsalis pedis pulse, adding an MR angiogram. Referral to  wound care at [facility]. Hydrocolloid dressing applied today.  Continue aggressive wound care at [facility], awaiting arterial imaging, cultures are growing out pseudomonas aeruginosa, this is going to need a course of ciprofloxacin, antibiotics called in. ___________________________________________ Gwen Her. Dianah Field, M.D., ABFM., CAQSM. Primary Care and Sports Medicine [place] [facility]  Adjunct Professor of [employer] [facility] [employer]

## 2017-12-15 NOTE — Addendum Note (Signed)
Addended by: Alena Bills R on: 12/15/2017 02:59 PM   Modules accepted: Orders

## 2017-12-17 LAB — WOUND CULTURE
MICRO NUMBER:: 91424985
SPECIMEN QUALITY:: ADEQUATE

## 2017-12-20 MED ORDER — CIPROFLOXACIN HCL 750 MG PO TABS: 750.0000 mg | ORAL_TABLET | Freq: Two times a day (BID) | ORAL | 0 refills | Status: AC

## 2017-12-20 NOTE — Addendum Note (Signed)
Addended by: Silverio Decamp on: 12/20/2017 08:34 AM   Modules accepted: Orders

## 2017-12-21 ENCOUNTER — Other Ambulatory Visit: Payer: Self-pay | Admitting: Sports Medicine

## 2017-12-21 DIAGNOSIS — L97412 Non-pressure chronic ulcer of right heel and midfoot with fat layer exposed: Principal | ICD-10-CM

## 2017-12-21 DIAGNOSIS — E119 Type 2 diabetes mellitus without complications: Secondary | ICD-10-CM

## 2017-12-21 DIAGNOSIS — E11621 Type 2 diabetes mellitus with foot ulcer: Secondary | ICD-10-CM

## 2017-12-21 NOTE — Progress Notes (Signed)
Lab ordered per radiology protocol.  

## 2017-12-28 DIAGNOSIS — L97409 Non-pressure chronic ulcer of unspecified heel and midfoot with unspecified severity: Secondary | ICD-10-CM | POA: Diagnosis not present

## 2017-12-28 DIAGNOSIS — E11621 Type 2 diabetes mellitus with foot ulcer: Secondary | ICD-10-CM | POA: Insufficient documentation

## 2017-12-28 DIAGNOSIS — G629 Polyneuropathy, unspecified: Secondary | ICD-10-CM | POA: Diagnosis not present

## 2017-12-28 DIAGNOSIS — R895 Abnormal microbiological findings in specimens from other organs, systems and tissues: Secondary | ICD-10-CM | POA: Insufficient documentation

## 2017-12-28 DIAGNOSIS — L97412 Non-pressure chronic ulcer of right heel and midfoot with fat layer exposed: Secondary | ICD-10-CM | POA: Diagnosis not present

## 2017-12-29 DIAGNOSIS — S91301A Unspecified open wound, right foot, initial encounter: Secondary | ICD-10-CM | POA: Diagnosis not present

## 2017-12-29 DIAGNOSIS — E1142 Type 2 diabetes mellitus with diabetic polyneuropathy: Secondary | ICD-10-CM | POA: Diagnosis not present

## 2017-12-29 DIAGNOSIS — I739 Peripheral vascular disease, unspecified: Secondary | ICD-10-CM | POA: Diagnosis not present

## 2018-01-10 DIAGNOSIS — E11621 Type 2 diabetes mellitus with foot ulcer: Secondary | ICD-10-CM | POA: Diagnosis not present

## 2018-01-10 DIAGNOSIS — E1142 Type 2 diabetes mellitus with diabetic polyneuropathy: Secondary | ICD-10-CM | POA: Diagnosis not present

## 2018-01-10 DIAGNOSIS — L97409 Non-pressure chronic ulcer of unspecified heel and midfoot with unspecified severity: Secondary | ICD-10-CM | POA: Diagnosis not present

## 2018-01-25 ENCOUNTER — Encounter: Payer: Self-pay | Admitting: Sports Medicine

## 2018-01-25 ENCOUNTER — Ambulatory Visit (INDEPENDENT_AMBULATORY_CARE_PROVIDER_SITE_OTHER): Payer: Medicare HMO | Admitting: Sports Medicine

## 2018-01-25 DIAGNOSIS — M2170 Unequal limb length (acquired), unspecified site: Secondary | ICD-10-CM

## 2018-01-25 DIAGNOSIS — I10 Essential (primary) hypertension: Secondary | ICD-10-CM | POA: Diagnosis not present

## 2018-01-25 DIAGNOSIS — L97409 Non-pressure chronic ulcer of unspecified heel and midfoot with unspecified severity: Secondary | ICD-10-CM | POA: Diagnosis not present

## 2018-01-25 DIAGNOSIS — E663 Overweight: Secondary | ICD-10-CM | POA: Diagnosis not present

## 2018-01-25 DIAGNOSIS — E11621 Type 2 diabetes mellitus with foot ulcer: Secondary | ICD-10-CM | POA: Diagnosis not present

## 2018-01-25 DIAGNOSIS — E1142 Type 2 diabetes mellitus with diabetic polyneuropathy: Secondary | ICD-10-CM | POA: Diagnosis not present

## 2018-01-25 DIAGNOSIS — L97412 Non-pressure chronic ulcer of right heel and midfoot with fat layer exposed: Secondary | ICD-10-CM | POA: Diagnosis not present

## 2018-01-25 NOTE — Assessment & Plan Note (Signed)
Right leg shorter than left by 1 cm. Custom orthotics with a right-sided lift. Return as needed.

## 2018-01-25 NOTE — Progress Notes (Signed)
    Patient was fitted for a : standard, cushioned, semi-rigid orthotic. The orthotic was heated and afterward the patient stood on the orthotic blank positioned on the orthotic stand. The patient was positioned in subtalar neutral position and 10 degrees of ankle dorsiflexion in a weight bearing stance. After completion of molding, a stable base was applied to the orthotic blank. The blank was ground to a stable position for weight bearing. Size: 13 Base: [employer] and safety inspector and [place]: Felt lift on the right. The patient ambulated these, and they were very comfortable.  I spent 40 minutes with this patient, greater than 50% was face-to-face time counseling regarding the below diagnosis, specifically the diagnosis and treatment modalities for leg length discrepancy.  ___________________________________________ Gwen Her. Dianah Field, M.D., ABFM., CAQSM. Primary Care and [place] Instructor of [place] [employer] [facility] [employer] Medicine

## 2018-02-15 DIAGNOSIS — L97409 Non-pressure chronic ulcer of unspecified heel and midfoot with unspecified severity: Secondary | ICD-10-CM | POA: Diagnosis not present

## 2018-02-15 DIAGNOSIS — L97412 Non-pressure chronic ulcer of right heel and midfoot with fat layer exposed: Secondary | ICD-10-CM | POA: Diagnosis not present

## 2018-02-15 DIAGNOSIS — I10 Essential (primary) hypertension: Secondary | ICD-10-CM | POA: Diagnosis not present

## 2018-02-15 DIAGNOSIS — E11621 Type 2 diabetes mellitus with foot ulcer: Secondary | ICD-10-CM | POA: Diagnosis not present

## 2018-02-15 DIAGNOSIS — E1142 Type 2 diabetes mellitus with diabetic polyneuropathy: Secondary | ICD-10-CM | POA: Diagnosis not present

## 2018-02-15 DIAGNOSIS — Z794 Long term (current) use of insulin: Secondary | ICD-10-CM | POA: Diagnosis not present

## 2018-03-08 DIAGNOSIS — I10 Essential (primary) hypertension: Secondary | ICD-10-CM | POA: Diagnosis not present

## 2018-03-08 DIAGNOSIS — E11621 Type 2 diabetes mellitus with foot ulcer: Secondary | ICD-10-CM | POA: Diagnosis not present

## 2018-03-08 DIAGNOSIS — L97409 Non-pressure chronic ulcer of unspecified heel and midfoot with unspecified severity: Secondary | ICD-10-CM | POA: Diagnosis not present

## 2018-03-08 DIAGNOSIS — Z794 Long term (current) use of insulin: Secondary | ICD-10-CM | POA: Diagnosis not present

## 2018-03-08 DIAGNOSIS — L97412 Non-pressure chronic ulcer of right heel and midfoot with fat layer exposed: Secondary | ICD-10-CM | POA: Diagnosis not present

## 2018-03-08 DIAGNOSIS — E1142 Type 2 diabetes mellitus with diabetic polyneuropathy: Secondary | ICD-10-CM | POA: Diagnosis not present

## 2018-03-21 DIAGNOSIS — I10 Essential (primary) hypertension: Secondary | ICD-10-CM | POA: Diagnosis not present

## 2018-03-21 DIAGNOSIS — M546 Pain in thoracic spine: Secondary | ICD-10-CM | POA: Diagnosis not present

## 2018-03-21 DIAGNOSIS — M542 Cervicalgia: Secondary | ICD-10-CM | POA: Diagnosis not present

## 2018-03-21 DIAGNOSIS — M79641 Pain in right hand: Secondary | ICD-10-CM | POA: Diagnosis not present

## 2018-03-21 DIAGNOSIS — Z7982 Long term (current) use of aspirin: Secondary | ICD-10-CM | POA: Diagnosis not present

## 2018-03-21 DIAGNOSIS — H269 Unspecified cataract: Secondary | ICD-10-CM | POA: Diagnosis not present

## 2018-03-21 DIAGNOSIS — M545 Low back pain: Secondary | ICD-10-CM | POA: Diagnosis not present

## 2018-03-21 DIAGNOSIS — M79631 Pain in right forearm: Secondary | ICD-10-CM | POA: Diagnosis not present

## 2018-03-21 DIAGNOSIS — Z8673 Personal history of transient ischemic attack (TIA), and cerebral infarction without residual deficits: Secondary | ICD-10-CM | POA: Diagnosis not present

## 2018-03-21 DIAGNOSIS — M79621 Pain in right upper arm: Secondary | ICD-10-CM | POA: Diagnosis not present

## 2018-03-21 DIAGNOSIS — E1142 Type 2 diabetes mellitus with diabetic polyneuropathy: Secondary | ICD-10-CM | POA: Diagnosis not present

## 2018-03-21 DIAGNOSIS — R51 Headache: Secondary | ICD-10-CM | POA: Diagnosis not present

## 2018-03-21 DIAGNOSIS — G8911 Acute pain due to trauma: Secondary | ICD-10-CM | POA: Diagnosis not present

## 2018-03-21 DIAGNOSIS — S0990XA Unspecified injury of head, initial encounter: Secondary | ICD-10-CM | POA: Diagnosis not present

## 2018-03-21 DIAGNOSIS — E1136 Type 2 diabetes mellitus with diabetic cataract: Secondary | ICD-10-CM | POA: Diagnosis not present

## 2018-04-05 DIAGNOSIS — L97412 Non-pressure chronic ulcer of right heel and midfoot with fat layer exposed: Secondary | ICD-10-CM | POA: Diagnosis not present

## 2018-04-18 ENCOUNTER — Ambulatory Visit (INDEPENDENT_AMBULATORY_CARE_PROVIDER_SITE_OTHER): Payer: Medicare HMO

## 2018-04-18 ENCOUNTER — Other Ambulatory Visit: Payer: Self-pay

## 2018-04-18 ENCOUNTER — Encounter: Payer: Self-pay | Admitting: Sports Medicine

## 2018-04-18 ENCOUNTER — Ambulatory Visit: Payer: Medicare HMO | Admitting: Sports Medicine

## 2018-04-18 ENCOUNTER — Telehealth: Payer: Self-pay | Admitting: Sports Medicine

## 2018-04-18 ENCOUNTER — Ambulatory Visit (INDEPENDENT_AMBULATORY_CARE_PROVIDER_SITE_OTHER): Payer: Medicare HMO | Admitting: Sports Medicine

## 2018-04-18 DIAGNOSIS — L97412 Non-pressure chronic ulcer of right heel and midfoot with fat layer exposed: Secondary | ICD-10-CM

## 2018-04-18 DIAGNOSIS — R0989 Other specified symptoms and signs involving the circulatory and respiratory systems: Secondary | ICD-10-CM | POA: Diagnosis not present

## 2018-04-18 DIAGNOSIS — E11621 Type 2 diabetes mellitus with foot ulcer: Secondary | ICD-10-CM | POA: Diagnosis not present

## 2018-04-18 DIAGNOSIS — M7989 Other specified soft tissue disorders: Secondary | ICD-10-CM | POA: Diagnosis not present

## 2018-04-18 NOTE — Telephone Encounter (Signed)
Dr. Darene Lamer   Mr. Strubel walked in and stated that his [place] doctor wanted him to return to you to discuss his wound.  He said that the wound doctor you sent him to at [facility] was not a wound doctor and they were not going to see him again. I told him you were at lunch and I would send you a phone a note. He did not give me anymore detail.  Jenny Reichmann

## 2018-04-18 NOTE — Assessment & Plan Note (Signed)
The last time we discussed this was back in December, my plan was to get a full vascular evaluation, we obtained a wound culture that grew out Pseudomonas but he never took his ciprofloxacin. I was unable to feel the dorsalis pedis pulse, and he only had a weak posterior tibial, we added MR angiography which he never got done. He has done some visits with wound care, and things are healing, he tells me he found a staple in his wound so we will get an x-ray today to ensure there are no more. I am going to go ahead and add a CT angiogram with lower extremity runoff as well. Renal function was done about 28 days ago.

## 2018-04-18 NOTE — Progress Notes (Signed)
Subjective:    CC: Issues with foot  HPI: Matthew [name] returns, I saw him back in November with a nonhealing diabetic foot ulcer.  We had intended to work him up aggressively, we ordered a MR angiography as his pulses were nonpalpable, he never got this done.  I cultured the wound, it grew out Pseudomonas, he never started the antibiotic.  We did refer him to wound care, he had a few wound care visits, and stopped going.  He tells me he found a staple in his foot and would like an x-ray to ensure there are no further staples.  Does not really want any other treatment.  I reviewed the past medical history, family history, social history, surgical history, and allergies today and no changes were needed.  Please see the problem list section below in epic for further details.  Past Medical History: Past Medical History:  Diagnosis Date  . Acute upper respiratory infection 02/18/2014  . Allergy   . Arthritis   . Carotid artery disease ([place]) 05/11/2013  . Chicken pox as a child  . Diabetes mellitus without complication ([place])   . Fall    every since his stroke  . GERD (gastroesophageal reflux disease)   . Measles   . Other and unspecified hyperlipidemia 05/11/2013  . Peripheral neuropathy   . Poor dental hygiene 05/11/2013  . Shingles 73 yrs old  . Sleep apnea    [facility]  . Stroke [facility]) 2007  . Vocal cord dysfunction    Past Surgical History: Past Surgical History:  Procedure Laterality Date  . APPENDECTOMY  2000   ruptured  . CAROTID ENDARTERECTOMY  09/2010  . EYE SURGERY  2010   cataracts bilateral   Social History: Social History   Socioeconomic History  . Marital status: Divorced    Spouse name: Not on file  . Number of children: Not on file  . Years of education: Not on file  . Highest education level: Not on file  Occupational History  . Occupation: [employer]  . Financial resource strain: Not on file  . Food insecurity:    Worry: Not on file   Inability: Not on file  . Transportation needs:    Medical: Not on file    Non-medical: Not on file  Tobacco Use  . Smoking status: Former Smoker    Years: 10.00    Types: Cigars    Start date: 11/27/1997  . Smokeless tobacco: Never Used  Substance and Sexual Activity  . Alcohol use: Yes    Alcohol/week: 0.0 standard drinks    Comment: occasionally drinks wine   . Drug use: No  . Sexual activity: Yes    Partners: Female    Comment: lives with roommate, minimal carbs  Lifestyle  . Physical activity:    Days per week: Not on file    Minutes per session: Not on file  . Stress: Not on file  Relationships  . Social connections:    Talks on phone: Not on file    Gets together: Not on file    Attends religious service: Not on file    Active member of club or organization: Not on file    Attends meetings of clubs or organizations: Not on file    Relationship status: Not on file  Other Topics Concern  . Not on file  Social History Narrative  . Not on file   Family History: Family History  Problem Relation Age of Onset  .  Cancer Mother        kidney  . Heart attack Father   . Stroke Brother   . Heart attack Brother   . Stroke Brother    Allergies: Allergies  Allergen Reactions  . Aggrenox [Aspirin-Dipyridamole Er] Shortness Of Breath   Medications: See med rec.  Review of Systems: No fevers, chills, night sweats, weight loss, chest pain, or shortness of breath.   Objective:    General: Well Developed, well nourished, and in no acute distress.  Neuro: Alert and oriented x3, extra-ocular muscles intact, sensation grossly intact.  HEENT: Normocephalic, atraumatic, pupils equal round reactive to light, neck supple, no masses, no lymphadenopathy, thyroid nonpalpable.  Skin: Warm and dry, no rashes. Cardiac: Regular rate and rhythm, no murmurs rubs or gallops, no lower extremity edema.  Respiratory: Clear to auscultation bilaterally. Not using accessory muscles, speaking  in full sentences.  Impression and Recommendations:    Diabetic ulcer of right foot ([place]) The last time we discussed this was back in December, my plan was to get a full vascular evaluation, we obtained a wound culture that grew out Pseudomonas but he never took his ciprofloxacin. I was unable to feel the dorsalis pedis pulse, and he only had a weak posterior tibial, we added MR angiography which he never got done. He has done some visits with wound care, and things are healing, he tells me he found a staple in his wound so we will get an x-ray today to ensure there are no more. I am going to go ahead and add a CT angiogram with lower extremity runoff as well. Renal function was done about 28 days ago.   ___________________________________________ Gwen Her. Dianah Field, M.D., ABFM., CAQSM. Primary Care and Sports Medicine [place] [facility]  Adjunct Professor of [place] [employer] [facility] of Medicine

## 2018-04-22 DIAGNOSIS — L97412 Non-pressure chronic ulcer of right heel and midfoot with fat layer exposed: Secondary | ICD-10-CM | POA: Diagnosis not present

## 2018-05-03 ENCOUNTER — Other Ambulatory Visit: Payer: Self-pay | Admitting: Sports Medicine

## 2018-05-03 DIAGNOSIS — E11621 Type 2 diabetes mellitus with foot ulcer: Secondary | ICD-10-CM

## 2018-05-03 DIAGNOSIS — L97412 Non-pressure chronic ulcer of right heel and midfoot with fat layer exposed: Principal | ICD-10-CM

## 2018-05-03 NOTE — Progress Notes (Signed)
[email or web address]

## 2018-05-03 NOTE — Addendum Note (Signed)
Addended by: Alena Bills R on: 05/03/2018 11:19 AM   Modules accepted: Orders

## 2018-05-03 NOTE — Progress Notes (Signed)
CT order updated per Provider request

## 2018-05-06 ENCOUNTER — Encounter: Payer: Self-pay | Admitting: Sports Medicine

## 2018-05-06 ENCOUNTER — Ambulatory Visit (INDEPENDENT_AMBULATORY_CARE_PROVIDER_SITE_OTHER): Payer: Medicare HMO | Admitting: Sports Medicine

## 2018-05-06 DIAGNOSIS — L97412 Non-pressure chronic ulcer of right heel and midfoot with fat layer exposed: Secondary | ICD-10-CM | POA: Diagnosis not present

## 2018-05-06 DIAGNOSIS — E119 Type 2 diabetes mellitus without complications: Secondary | ICD-10-CM | POA: Diagnosis not present

## 2018-05-06 DIAGNOSIS — E11621 Type 2 diabetes mellitus with foot ulcer: Secondary | ICD-10-CM

## 2018-05-06 MED ORDER — SILVRSTAT WOUND DRESSING EX GEL: 1.0000 "application " | Freq: Every day | CUTANEOUS | 11 refills | Status: DC

## 2018-05-06 NOTE — Progress Notes (Addendum)
Subjective:    CC: Follow-up  HPI: Matthew [name] returns, he continues to have a chronic nonhealing diabetic foot ulcer, his ABIs were high so we ordered a CT aortobifemoral angiography with lower extremity runoff to assess him for revascularization.  He has still not got this done.  We also found Pseudomonas in his wound, he has refused to take antibiotics.  He is here for further discussion.  I reviewed the past medical history, family history, social history, surgical history, and allergies today and no changes were needed.  Please see the problem list section below in epic for further details.  Past Medical History: Past Medical History:  Diagnosis Date  . Acute upper respiratory infection 02/18/2014  . Allergy   . Arthritis   . Carotid artery disease ([place]) 05/11/2013  . Chicken pox as a child  . Diabetes mellitus without complication ([place])   . Fall    every since his stroke  . GERD (gastroesophageal reflux disease)   . Measles   . Other and unspecified hyperlipidemia 05/11/2013  . Peripheral neuropathy   . Poor dental hygiene 05/11/2013  . Shingles 73 yrs old  . Sleep apnea    [facility]  . Stroke [facility]) 2007  . Vocal cord dysfunction    Past Surgical History: Past Surgical History:  Procedure Laterality Date  . APPENDECTOMY  2000   ruptured  . CAROTID ENDARTERECTOMY  09/2010  . EYE SURGERY  2010   cataracts bilateral   Social History: Social History   Socioeconomic History  . Marital status: Divorced    Spouse name: Not on file  . Number of children: Not on file  . Years of education: Not on file  . Highest education level: Not on file  Occupational History  . Occupation: [employer]  . Financial resource strain: Not on file  . Food insecurity:    Worry: Not on file    Inability: Not on file  . Transportation needs:    Medical: Not on file    Non-medical: Not on file  Tobacco Use  . Smoking status: Former Smoker    Years: 10.00    Types:  Cigars    Start date: 11/27/1997  . Smokeless tobacco: Never Used  Substance and Sexual Activity  . Alcohol use: Yes    Alcohol/week: 0.0 standard drinks    Comment: occasionally drinks wine   . Drug use: No  . Sexual activity: Yes    Partners: Female    Comment: lives with roommate, minimal carbs  Lifestyle  . Physical activity:    Days per week: Not on file    Minutes per session: Not on file  . Stress: Not on file  Relationships  . Social connections:    Talks on phone: Not on file    Gets together: Not on file    Attends religious service: Not on file    Active member of club or organization: Not on file    Attends meetings of clubs or organizations: Not on file    Relationship status: Not on file  Other Topics Concern  . Not on file  Social History Narrative  . Not on file   Family History: Family History  Problem Relation Age of Onset  . Cancer Mother        kidney  . Heart attack Father   . Stroke Brother   . Heart attack Brother   . Stroke Brother    Allergies: Allergies  Allergen  Reactions  . Aggrenox [Aspirin-Dipyridamole Er] Shortness Of Breath   Medications: See med rec.  Review of Systems: No fevers, chills, night sweats, weight loss, chest pain, or shortness of breath.   Objective:    General: Well Developed, well nourished, and in no acute distress.  Neuro: Alert and oriented x3, extra-ocular muscles intact, sensation grossly intact.  HEENT: Normocephalic, atraumatic, pupils equal round reactive to light, neck supple, no masses, no lymphadenopathy, thyroid nonpalpable.  Skin: Warm and dry, no rashes. Cardiac: Regular rate and rhythm, no murmurs rubs or gallops, no lower extremity edema.  Respiratory: Clear to auscultation bilaterally. Not using accessory muscles, speaking in full sentences. Right foot: There is a 2 x 3 cm ulcer that appears similar to prior, no surrounding erythema, no drainage, no malodorous discharge, subcutaneous tissue is  visible but no muscle or bone.  I applied Silvadene, Puraply  wound dressing, as well as gauze.  Impression and Recommendations:    Diabetic ulcer of right foot ([place]) Still awaiting CT aortobifemoral with lower extremity runoff. He will go ahead and schedule this today. Chronic nonhealing diabetic ulcer on the bottom of the foot, abnormal ABIs, so we are evaluating for revascularization which certainly could help his wound heal. I redressed the wound, we applied Silvadene, I am going to call in some silver stat for him to use. I also applied a Puraply antimicrobial wound matrix. Return after CT to discuss results.  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  I spent 25 minutes with this patient, greater than 50% was face-to-face time counseling regarding the above diagnoses.  ___________________________________________ Gwen Her. Dianah Field, M.D., ABFM., CAQSM. Primary Care and Sports Medicine [place] [facility]  Adjunct Professor of [place] [employer] [facility] of Medicine

## 2018-05-06 NOTE — Assessment & Plan Note (Addendum)
Still awaiting CT aortobifemoral with lower extremity runoff. He will go ahead and schedule this today. Chronic nonhealing diabetic ulcer on the bottom of the foot, abnormal ABIs, so we are evaluating for revascularization which certainly could help his wound heal. I redressed the wound, we applied Silvadene, I am going to call in some silver stat for him to use. I also applied a Puraply antimicrobial wound matrix. Return after CT to discuss results.  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

## 2018-05-07 LAB — COMPREHENSIVE METABOLIC PANEL
AG Ratio: 1.8 (calc) (ref 1.0–2.5)
AST: 26 U/L (ref 10–35)
Albumin: 4.7 g/dL (ref 3.6–5.1)
BUN/Creatinine Ratio: 12 (calc) (ref 6–22)
CO2: 27 mmol/L (ref 20–32)
Chloride: 106 mmol/L (ref 98–110)
Globulin: 2.6 g/dL (calc) (ref 1.9–3.7)
Glucose, Bld: 153 mg/dL — ABNORMAL HIGH (ref 65–99)
Potassium: 5.1 mmol/L (ref 3.5–5.3)
Sodium: 142 mmol/L (ref 135–146)
Total Bilirubin: 0.9 mg/dL (ref 0.2–1.2)
Total Protein: 7.3 g/dL (ref 6.1–8.1)

## 2018-05-07 LAB — COMPREHENSIVE METABOLIC PANEL WITH GFR
ALT: 21 U/L (ref 9–46)
Alkaline phosphatase (APISO): 60 U/L (ref 35–144)
BUN: 15 mg/dL (ref 7–25)
Calcium: 9.7 mg/dL (ref 8.6–10.3)
Creat: 1.27 mg/dL — ABNORMAL HIGH (ref 0.70–1.18)

## 2018-05-09 DIAGNOSIS — L97412 Non-pressure chronic ulcer of right heel and midfoot with fat layer exposed: Secondary | ICD-10-CM | POA: Diagnosis not present

## 2018-05-13 ENCOUNTER — Other Ambulatory Visit: Payer: Self-pay

## 2018-05-13 ENCOUNTER — Ambulatory Visit (INDEPENDENT_AMBULATORY_CARE_PROVIDER_SITE_OTHER): Payer: Medicare HMO

## 2018-05-13 DIAGNOSIS — R0989 Other specified symptoms and signs involving the circulatory and respiratory systems: Secondary | ICD-10-CM

## 2018-05-13 DIAGNOSIS — E11621 Type 2 diabetes mellitus with foot ulcer: Secondary | ICD-10-CM | POA: Diagnosis not present

## 2018-05-13 DIAGNOSIS — I701 Atherosclerosis of renal artery: Secondary | ICD-10-CM | POA: Diagnosis not present

## 2018-05-13 DIAGNOSIS — L97412 Non-pressure chronic ulcer of right heel and midfoot with fat layer exposed: Secondary | ICD-10-CM | POA: Diagnosis not present

## 2018-05-13 MED ORDER — IOPAMIDOL (ISOVUE-370) INJECTION 76%
100.0000 mL | Freq: Once | INTRAVENOUS | Status: AC | PRN
  Administered 2018-05-13: 100 mL via INTRAVENOUS

## 2018-05-13 NOTE — Addendum Note (Signed)
Addended by: Silverio Decamp on: 05/13/2018 01:49 PM   Modules accepted: Orders

## 2018-05-30 ENCOUNTER — Other Ambulatory Visit: Payer: Self-pay | Admitting: Sports Medicine

## 2018-05-30 DIAGNOSIS — E11621 Type 2 diabetes mellitus with foot ulcer: Secondary | ICD-10-CM

## 2018-06-08 ENCOUNTER — Ambulatory Visit
Admission: RE | Admit: 2018-06-08 | Discharge: 2018-06-08 | Disposition: A | Discharge status: Home / Self Care | Payer: Medicare HMO | Source: Ambulatory Visit | Attending: Sports Medicine | Admitting: Sports Medicine

## 2018-06-08 ENCOUNTER — Encounter: Payer: Self-pay | Admitting: *Deleted

## 2018-06-08 DIAGNOSIS — E11621 Type 2 diabetes mellitus with foot ulcer: Secondary | ICD-10-CM | POA: Diagnosis not present

## 2018-06-08 DIAGNOSIS — L97418 Non-pressure chronic ulcer of right heel and midfoot with other specified severity: Secondary | ICD-10-CM | POA: Diagnosis not present

## 2018-06-08 HISTORY — PX: IR RADIOLOGIST EVAL & MGMT: IMG5224

## 2018-06-08 NOTE — Consult Note (Signed)
Chief Complaint: Right foot non-healing wound  Referring Physician(s): Thekkekandam,Thomas J  History of Present Illness: Matthew [name] is a 73 y.o. male presenting today as a scheduled appointment to Vascular & Interventional Radiology, kindly referred by Dr. Dianah Field, for evaluation of potential candidacy for endovascular treatment of lower extremity arterial disease.   Matthew Biss is here today accompanied by his girlfriend.  They are very good historians.  He tells me that the wound on his right foot started in 2015, and has been present ongoing for 5 years.  The initial event was penetrating trauma from a lawnmower blade.  This required surgery, and the wound has never closed since that time.  He has a long history of receiving care at wound care center, but he tells me he is no longer going there.    He does have a history of infection at this site, which was within the last year, and required a course of antibiotics prescribed by Dr. Dianah Field.  He is not currently on ABX.  He denies fever, rigors, chills.  He mainly takes care of the wound at home, with the help of his girlfriend.    He has never had a spontaneous wound of the toes of either the right foot or the left foot.    CV risk factors are: DM, tobacco use history, carotid disease, prior CVA  He remains very active, currently doing renovations on a house.  He was very disappointed previously to have received instructions from prior physician to "stay off his feet" to allow wound healing.    He has no symptoms of claudication.  He does have neuropathic sensory changes, and he tells me he has no sensation in the feet.  He reports bilateral hand burning type pain.    He has history of stroke, treated medically at the [place].  He has no history of MI.   He has had a prior left CEA.    Surgical history includes appendectomy, and the surgical treatment of his right foot.    Reportedly, he has had prior ABI/non-invasive, but  this may be with the [facility] system.  We will pursue this.   Prior CTA/runoff shows [place] preponderance of small vessel and tibial disease bilateral lower extremities.   Past Medical History:  Diagnosis Date   Acute upper respiratory infection 02/18/2014   Allergy    Arthritis    Carotid artery disease ([place]) 05/11/2013   Chicken pox as a child   Diabetes mellitus without complication ([place])    Fall    every since his stroke   GERD (gastroesophageal reflux disease)    Measles    Other and unspecified hyperlipidemia 05/11/2013   Peripheral neuropathy    Poor dental hygiene 05/11/2013   Shingles 73 yrs old   Sleep apnea    [facility]   [facility]) 2007   Vocal cord dysfunction     Past Surgical History:  Procedure Laterality Date   APPENDECTOMY  2000   ruptured   CAROTID ENDARTERECTOMY  09/2010   EYE SURGERY  2010   cataracts bilateral    Allergies: Aggrenox [aspirin-dipyridamole er]  Medications: Prior to Admission medications   Medication Sig Start Date End Date Taking? Authorizing Provider  aspirin 81 MG tablet Take 81 mg by mouth daily.    [provider]  insulin glargine (LANTUS) 100 UNIT/ML injection Inject 100 Units into the skin daily.     [provider]  metFORMIN (GLUCOPHAGE) 1000  MG tablet Take by mouth.    [provider]  pregabalin (LYRICA) 100 MG capsule Take 100 mg by mouth 2 (two) times daily.    [provider]  Silver (SILVRSTAT WOUND DRESSING) GEL Apply 1 application topically daily. 05/06/18   Silverio Decamp, MD     Family History  Problem Relation Age of Onset   Cancer Mother        kidney   Heart attack Father    Stroke Brother    Heart attack Brother    Stroke Brother     Social History   Socioeconomic History   Marital status: Divorced    Spouse name: Not on file   Number of children: Not on file   Years of education: Not on file   Highest education  level: Not on file  Occupational History   Occupation: Associate Professor strain: Not on file   Food insecurity:    Worry: Not on file    Inability: Not on file   Transportation needs:    Medical: Not on file    Non-medical: Not on file  Tobacco Use   Smoking status: Former Smoker    Years: 10.00    Types: Cigars    Start date: 11/27/1997   Smokeless tobacco: Never Used  Substance and Sexual Activity   Alcohol use: Yes    Alcohol/week: 0.0 standard drinks    Comment: occasionally drinks wine    Drug use: No   Sexual activity: Yes    Partners: Female    Comment: lives with roommate, minimal carbs  Lifestyle   Physical activity:    Days per week: Not on file    Minutes per session: Not on file   Stress: Not on file  Relationships   Social connections:    Talks on phone: Not on file    Gets together: Not on file    Attends religious service: Not on file    Active member of club or organization: Not on file    Attends meetings of clubs or organizations: Not on file    Relationship status: Not on file  Other Topics Concern   Not on file  Social History Narrative   Not on file      Review of Systems: A 12 point ROS discussed and pertinent positives are indicated in the HPI above.  All other systems are negative.  Review of Systems  Vital Signs: BP (!) 146/63    Pulse 63    Temp (!) 97.1 F (36.2 C)    SpO2 97%   Physical Exam General: 73 yo male appearing  stated age.  Well-developed, well-nourished.  No distress. HEENT: Atraumatic, normocephalic.  Conjugate gaze, extra-ocular motor intact. No scleral icterus or scleral injection. No lesions on external ears, nose, lips, or gums.  Oral mucosa moist, pink.  Neck: Symmetric with no goiter enlargement.  Chest/Lungs:  Symmetric chest with inspiration/expiration.  No labored breathing.  Clear to auscultation with no wheezes, rhonchi, or rales.  Heart:  RRR, with no third heart sounds  appreciated. No JVD appreciated.  Abdomen:  Soft, NT/ND, with + bowel sounds.   Genito-urinary: Deferred Neurologic: Alert & Oriented to person, place, and time.   Normal affect and insight.  Appropriate questions.  Moving all 4 extremities   Pulse Exam:  No bruit appreciated.  No palpable pulsatile abdominal mass.  Palpable right CFA & left CFA.  Palpable right popliteal artery & left popliteal  artery.   Nonpalpable right DP.  Palpable PT.  Doppler signal is monophasic at the right DP and PT.  Non-palpable left PT, left DP.  Doppler signal is monophasic at the left DP and PT.   Extremities:  Linear scar on the plantar right foot from midfoot to heel.  There is open wound of the medial heel, with no oozing/drainage.  Oval shaped, perhaps 3x4cm.  The central portion has deeper crater, though not into the soft tissues/bone.  No sign of infection/redness. No foot redness.  Hairless bilateral lower extremity.  Mild changes of venous stasis, with staining. Small open wound on the anterior left shin, ~0.5cm, with bleeding, superficial.  Reported as trauma from construction.   Mallampati Score:  2  Imaging: Ct Angio Ao+bifem W & Or Wo Contrast  Result Date: 05/13/2018 CLINICAL DATA:  73 year old male with a history of lawnmower injury to the right foot which has not healed completely. Evaluate for underlying arterial insufficiency. EXAM: CT ANGIOGRAPHY OF ABDOMINAL AORTA WITH ILIOFEMORAL RUNOFF TECHNIQUE: Multidetector CT imaging of the abdomen, pelvis and lower extremities was performed using the standard protocol during bolus administration of intravenous contrast. Multiplanar CT image reconstructions and MIPs were obtained to evaluate the vascular anatomy. CONTRAST:  157mL ISOVUE-370 IOPAMIDOL (ISOVUE-370) INJECTION 76% COMPARISON:  Right lower extremity x-ray 04/18/2018 FINDINGS: VASCULAR Aorta: Mild heterogeneous atherosclerotic plaque. No evidence of aneurysm or dissection. Celiac: Patent without  evidence of aneurysm, dissection, vasculitis or significant stenosis. SMA: Patent without evidence of aneurysm, dissection, vasculitis or significant stenosis. Renals: Solitary renal arteries. Heterogeneous atherosclerotic plaque results in a moderate focal stenosis of the proximal right renal artery. The left renal artery is largely disease free. No evidence of dissection, aneurysm or fibromuscular dysplasia. IMA: Patent without evidence of aneurysm, dissection, vasculitis or significant stenosis. RIGHT Lower Extremity Inflow: Common, internal and external iliac arteries are patent without evidence of aneurysm, dissection, vasculitis or significant stenosis. Outflow: Common, superficial and profunda femoral arteries and the popliteal artery are patent without evidence of aneurysm, dissection, vasculitis or significant stenosis. Trace atherosclerotic plaque along the medial wall of the common femoral artery without evidence of stenosis. Runoff: Limited evaluation of the runoff arteries. The anterior tibial artery occludes in the upper calf. The posterior tibial artery occludes in the distal calf. The peroneal artery appears to be patent to the ankle. LEFT Lower Extremity Inflow: Common, internal and external iliac arteries are patent without evidence of aneurysm, dissection, vasculitis or significant stenosis. Outflow: Common, superficial and profunda femoral arteries and the popliteal artery are patent without evidence of aneurysm, dissection, vasculitis or significant stenosis. Trace calcified atherosclerotic plaque along the medial wall of the common femoral artery without evidence of stenosis. Runoff: Unfortunately, evaluation of the runoff arteries is suboptimal. The vessels are poorly opacified. There appears to be atherosclerotic plaque resulting in a significant stenosis at the origin of the anterior tibial artery. The anterior tibial artery then occludes in the upper calf. The posterior tibial and peroneal  arteries appear to be patent to the ankle. Veins: No focal venous abnormality. Review of the MIP images confirms the above findings. NON-VASCULAR Lower chest: No acute abnormality. Hepatobiliary: No focal liver abnormality is seen. No gallstones, gallbladder wall thickening, or biliary dilatation. Pancreas: Unremarkable. No pancreatic ductal dilatation or surrounding inflammatory changes. Spleen: Normal in size without focal abnormality. Adrenals/Urinary Tract: Adrenal glands are unremarkable. Kidneys are normal, without renal calculi, focal lesion, or hydronephrosis. The bladder is under distended but mildly thick walled. Stomach/Bowel: There are a  few scattered diverticula along the descending colon but no evidence of active diverticulitis. No focal bowel wall thickening or evidence of obstruction. Surgical changes of prior appendectomy. The terminal ileum is unremarkable. Lymphatic: No suspicious lymphadenopathy. Reproductive: Mild prostatomegaly. Other: No abdominal wall hernia or abnormality. No abdominopelvic ascites. Musculoskeletal: No acute fracture or aggressive appearing lytic or blastic osseous lesion. IMPRESSION: VASCULAR 1. Bilateral lower extremity runoff (below the knee) peripheral arterial disease. On the right, there appears to be occlusion of the anterior tibial artery with patent 2 vessel runoff to the ankle. On the left, the anterior and posterior tibial artery both occlude leaving a 1 vessel runoff to the ankle from the peroneal artery. Consider referral to Interventional Radiology or other endovascular specialist for possible catheter directed angiography and intervention in the setting of a poorly healing right foot wound. 2. No evidence of hemodynamically significant peripheral arterial disease in the inflow (aortoiliac) or outflow (femoral popliteal) distributions. 3. 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 4. Mild atherosclerotic plaque throughout the abdominal aorta without  evidence of dissection, aneurysm or penetrating ulcer. Aortic Atherosclerosis (ICD10-170.0). NON-VASCULAR 1. No acute abnormality within the abdomen or pelvis. 2. Colonic diverticular disease without CT evidence of active inflammation. 3. Mild prostatomegaly. Signed, Criselda Peaches, MD, [facility] Electronically Signed   By: Jacqulynn Cadet M.D.   On: 05/13/2018 13:13    Labs:  CBC: No results for input(s): WBC, HGB, HCT, PLT in the last 8760 hours.  COAGS: No results for input(s): INR, APTT in the last 8760 hours.  BMP: Recent Labs    05/06/18 1139  NA 142  K 5.1  CL 106  CO2 27  GLUCOSE 153*  BUN 15  CALCIUM 9.7  CREATININE 1.27*    LIVER FUNCTION TESTS: Recent Labs    05/06/18 1139  BILITOT 0.9  AST 26  ALT 21  PROT 7.3    TUMOR MARKERS: No results for input(s): AFPTM, CEA, CA199, CHROMGRNA in the last 8760 hours.  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        Non-invasive test/ABI was reportedly performed previously, and may be with the [facility] system.   I had a lengthy discussion with Matthew Searfoss and family regarding anatomy, pathology/pathophysiology, natural history, and prognosis of CLI.  This included a discussion regarding the risks of chronic wounds in the lower extremity of patients with long standing DM, specifically the increased risk of infection/amputation.   Strategies for treatment include continued conservative medical management and wound care, vs endovascular options, with a risk/benefit discussion.  The indications for treatment with endovascular approach were discussed, as supported by updated guidelines1, 2 .  Regarding endovascular options, specific risks discussed include: bleeding, infection, contrast reaction, renal  injury/nephropathy, arterial injury/dissection, need for additional procedure/surgery, worsening symptoms/tissue including limb loss, cardiopulmonary collapse, death.    I did estimate that his risk for amputation is above the baseline of all-comers given the DM and status of the wound, even though his immediate risk is probably low.   Patient tells me that he is likely to elect to proceed with endovascular options, however, he did not want to decide right now, instead electing to discuss further with his family.     I emphasized continuing maximal medical management for reduction of risk factors as recommended by updated AHA  guidelines1.  These includes anti-platelet medication, tight blood glucose control to a HbA1c < 7, tight blood pressure control, maximum-dose HMG-CoA reductase inhibitor, and smoking cessation.    Annual flu vaccination is also recommended, with Class 1 recommendation1. He tells me, "I do not take shots," and does not receive flu vaccine.   Plan: - He is going to discuss further with his family.  If he would like to pursue possible treatment, he will call back.  We would then schedule for lower extremity angiogram and possible intervention, right leg with Dr. Earleen Newport.  His preference was after the week of June 2.  - Continue maximal medical therapy, with consideration of adding HMG-CoA reductase inhibitor.  - He reportedly has had prior ABI/non-invasive.  We will get a copy of this from Dr. Landry Corporal office.  If this is not available, we will need to schedule ABI/non-invasive pre-op. (accord with VQI quality improvement)  -Annual flu vaccination is recommended in the setting of known PAD, in the absence of contra-indications.    ___________________________________________________________________   1Morley Kos MD, et al. 2016 AHA/ACC Guideline on the Management of Patients With Lower Extremity Peripheral Artery Disease: Executive Summary: A Report of the American  College of Cardiology/American Heart Association Task Force on Clinical Practice Guidelines. J Am Coll Cardiol. 2017 Mar 21;69(11):1465-1508. doi: 10.1016/j.jacc.2016.11.008.   2 - Norgren L, et al. TASC II Working Group. Inter-society consensus for the management of peripheral arterial disease. Int Tressia Miners. 2007 Jun;26(2):81-157. Review. PubMed PMID: 65784696  3 - Hingorani A, et al. The management of diabetic foot: A clinical practice guideline by the Society for Vascular Surgery in collaboration with the [place] and the Society  for Vascular Medicine. J Vasc Surg. 2016 Feb;63(2 Suppl):3S-21S. doi: 10.1016/j.jvs.2015.10.003. PubMed PMID: 29528413.   Thank you for this interesting consult.  I greatly enjoyed meeting TINA TEMME and look forward to participating in their care.  A copy of this report was sent to the requesting provider on this date.  Electronically Signed: Corrie Mckusick 06/08/2018, 1:01 PM   I spent a total of  60 Minutes   in face to face in clinical consultation, greater than 50% of which was counseling/coordinating care for right foot wound, chronic limb threatening ischemia, possible angiogram, possible intervention

## 2018-06-27 ENCOUNTER — Telehealth: Payer: Self-pay | Admitting: *Deleted

## 2018-06-27 NOTE — Telephone Encounter (Signed)
Mr. Matthew [name] was seen in clinic by Dr. Earleen Newport on 06/08/18, regarding the diabetic ulcer of rt heel. Dr. Earleen Newport offered treatment. I called Mr. Matthew [name] today and he states he probably will have procedure but will need more time to think it over. I advised to call once he makes a decision.Matthew [name]

## 2018-07-14 ENCOUNTER — Other Ambulatory Visit: Payer: Self-pay | Admitting: Podiatry

## 2018-07-18 ENCOUNTER — Encounter: Payer: Self-pay | Admitting: Podiatry

## 2018-07-18 ENCOUNTER — Other Ambulatory Visit: Payer: Self-pay

## 2018-07-18 ENCOUNTER — Ambulatory Visit (INDEPENDENT_AMBULATORY_CARE_PROVIDER_SITE_OTHER): Payer: Medicare HMO | Admitting: Podiatry

## 2018-07-18 ENCOUNTER — Other Ambulatory Visit: Payer: Self-pay | Admitting: Podiatry

## 2018-07-18 ENCOUNTER — Ambulatory Visit (INDEPENDENT_AMBULATORY_CARE_PROVIDER_SITE_OTHER): Payer: Medicare HMO

## 2018-07-18 VITALS — Temp 98.1°F

## 2018-07-18 DIAGNOSIS — L97512 Non-pressure chronic ulcer of other part of right foot with fat layer exposed: Secondary | ICD-10-CM

## 2018-07-18 DIAGNOSIS — L97412 Non-pressure chronic ulcer of right heel and midfoot with fat layer exposed: Secondary | ICD-10-CM | POA: Diagnosis not present

## 2018-07-18 DIAGNOSIS — E08621 Diabetes mellitus due to underlying condition with foot ulcer: Secondary | ICD-10-CM

## 2018-07-18 DIAGNOSIS — E0843 Diabetes mellitus due to underlying condition with diabetic autonomic (poly)neuropathy: Secondary | ICD-10-CM

## 2018-07-18 DIAGNOSIS — L97501 Non-pressure chronic ulcer of other part of unspecified foot limited to breakdown of skin: Secondary | ICD-10-CM

## 2018-07-18 NOTE — Progress Notes (Signed)
07/18/2018  Order for [place] faxed to [facility] [phone].  Wound exudate: low Wound location: right heel Wound size: 3.0 x 1.5 x 0.3  Dispense every 30 days for duration of 90 days. Daily use

## 2018-07-20 NOTE — Progress Notes (Signed)
   Subjective:  73 y.o. male with PMHx of diabetes mellitus presenting today as a new patient with a chief complaint of an ulceration of the plantar right heel that has been present for the past 4-5 years. He states he initially injured the area when a lawn mower blade cut the skin off the foot. He has been treated at the [facility] for the wound but was told his blood flow is inadequate so the wound is not healing properly. There are no modifying factors noted. Patient is here for further evaluation and treatment.   Past Medical History:  Diagnosis Date  . Acute upper respiratory infection 02/18/2014  . Allergy   . Arthritis   . Carotid artery disease ([place]) 05/11/2013  . Chicken pox as a child  . Diabetes mellitus without complication ([place])   . Fall    every since his stroke  . GERD (gastroesophageal reflux disease)   . Measles   . Other and unspecified hyperlipidemia 05/11/2013  . Peripheral neuropathy   . Poor dental hygiene 05/11/2013  . Shingles 73 yrs old  . Sleep apnea    [facility]  . [facility]roke [facility]) 2007  . Vocal cord dysfunction       Objective/Physical Exam General: The patient is alert and oriented x3 in no acute distress.  Dermatology:  Wound #1 noted to the right heel measuring 3.0 x 1.5 x 0.3 cm (LxWxD).   To the noted ulceration(s), there is no eschar. There is a moderate amount of slough, fibrin, and necrotic tissue noted. Granulation tissue and wound base is red. There is a minimal amount of serosanguineous drainage noted. There is no exposed bone muscle-tendon ligament or joint. There is no malodor. Periwound integrity is intact. Skin is warm, dry and supple bilateral lower extremities.  Vascular: Palpable pedal pulses bilaterally. No edema or erythema noted. Capillary refill within normal limits.  Neurological: Epicritic and protective threshold diminished bilaterally.   Musculoskeletal Exam: Range of motion within normal limits to all pedal and ankle  joints bilateral. Muscle strength 5/5 in all groups bilateral.   Assessment: 1. Ulceration of the right heel secondary to diabetes mellitus 2. diabetes mellitus w/ peripheral neuropathy   Plan of Care:  1. Patient was evaluated. 2. medically necessary excisional debridement including subcutaneous tissue was performed using a tissue nipper and a chisel blade. Excisional debridement of all the necrotic nonviable tissue down to healthy bleeding viable tissue was performed with post-debridement measurements same as pre-. 3. the wound was cleansed and dry sterile dressing applied. 4. Recommended collagen daily with a dry sterile dressing.  5. Order sent to [facility] medical supply for supplies for patient.  6. patient is to return to clinic in 3 weeks.   Edrick Kins, DPM [facility]  Dr. Edrick Kins, [address]                                        [place], [place] [address]                Office [phone]  Fax [phone]

## 2018-08-03 DIAGNOSIS — Z8546 Personal history of malignant neoplasm of prostate: Secondary | ICD-10-CM | POA: Diagnosis not present

## 2018-08-03 DIAGNOSIS — N529 Male erectile dysfunction, unspecified: Secondary | ICD-10-CM | POA: Diagnosis not present

## 2018-08-08 ENCOUNTER — Ambulatory Visit (INDEPENDENT_AMBULATORY_CARE_PROVIDER_SITE_OTHER): Payer: Medicare HMO | Admitting: Podiatry

## 2018-08-08 ENCOUNTER — Other Ambulatory Visit: Payer: Self-pay

## 2018-08-08 ENCOUNTER — Encounter: Payer: Self-pay | Admitting: Podiatry

## 2018-08-08 VITALS — Temp 96.8°F

## 2018-08-08 DIAGNOSIS — L97412 Non-pressure chronic ulcer of right heel and midfoot with fat layer exposed: Secondary | ICD-10-CM | POA: Diagnosis not present

## 2018-08-08 DIAGNOSIS — E0843 Diabetes mellitus due to underlying condition with diabetic autonomic (poly)neuropathy: Secondary | ICD-10-CM

## 2018-08-09 ENCOUNTER — Telehealth: Payer: Self-pay

## 2018-08-09 NOTE — Telephone Encounter (Signed)
Spoke with patient advising him that the reason for some sent the col-active ag dressing was due to insurance.  They stated that his insurance may not cover the cost of the [employer] product.  Patient stated that when he took the product off of his foot that it would cause his skin to peel and cause irritation.  I was advised from [facility] to have the patient call and discussed this with him so that they can prescribe him an alternative product.

## 2018-08-09 NOTE — Progress Notes (Signed)
   Subjective:  73 y.o. male with PMHx of diabetes mellitus presenting today for follow-up evaluation of an ulcer to the plantar aspect of the right foot.  Last visit on 07/18/2018 orders were placed for wound care supplies however the patient states that he received the wrong supplies.  Patient states that overall he believes the wound is improving.  Past Medical History:  Diagnosis Date  . Acute upper respiratory infection 02/18/2014  . Allergy   . Arthritis   . Carotid artery disease ([place]) 05/11/2013  . Chicken pox as a child  . Diabetes mellitus without complication ([place])   . Fall    every since his stroke  . GERD (gastroesophageal reflux disease)   . Measles   . Other and unspecified hyperlipidemia 05/11/2013  . Peripheral neuropathy   . Poor dental hygiene 05/11/2013  . Shingles 73 yrs old  . Sleep apnea    [facility]  . Stroke [facility]) 2007  . Vocal cord dysfunction       Objective/Physical Exam General: The patient is alert and oriented x3 in no acute distress.  Dermatology:  Wound #1 noted to the right heel measuring 3.0 x 1.4 x 0.3 cm (LxWxD).   To the noted ulceration(s), there is no eschar. There is a moderate amount of slough, fibrin, and necrotic tissue noted. Granulation tissue and wound base is red. There is a minimal amount of serosanguineous drainage noted. There is no exposed bone muscle-tendon ligament or joint. There is no malodor. Periwound integrity is intact. Skin is warm, dry and supple bilateral lower extremities.  Vascular: Palpable pedal pulses bilaterally. No edema or erythema noted. Capillary refill within normal limits.  Neurological: Epicritic and protective threshold diminished bilaterally.   Musculoskeletal Exam: Range of motion within normal limits to all pedal and ankle joints bilateral. Muscle strength 5/5 in all groups bilateral.   Assessment: 1. Ulceration of the right heel secondary to diabetes mellitus 2. diabetes mellitus w/  peripheral neuropathy   Plan of Care:  1. Patient was evaluated. 2. medically necessary excisional debridement including subcutaneous tissue was performed using a tissue nipper and a chisel blade. Excisional debridement of all the necrotic nonviable tissue down to healthy bleeding viable tissue was performed with post-debridement measurements same as pre-. 3. the wound was cleansed and dry sterile dressing applied. 4.  Today revisional orders were place to [facility] wound care supply company for [employer]. Instead, patient received a different brand of collagen matrix which he was not pleased with. 5.  [employer] Promogran AG was provided for the patient for dressing changes at home 6.  Return to clinic in 3 weeks  Edrick Kins, DPM [facility]  Dr. Edrick Kins, [address]                                        [place], [place] [address]                Office [phone]  Fax [phone]

## 2018-08-09 NOTE — Telephone Encounter (Signed)
-----   Message from Edrick Kins, DPM sent at 08/09/2018  1:02 PM EDT ----- Regarding: Wrong wound care product sent to patient from [facility],  Order was placed through [facility] for [facility] Ag.  Instead the patient received ColActive Plus Ag (another type of collagen matrix).  The patient does not like this product.  It looks very different from Strawberry.  Could we please reach out to [employer] and get the patient Prisma Pomegran Ag (the octagon-looking collagen matrix that we have in our office)  Thanks, Dr. Amalia Hailey

## 2018-08-15 DIAGNOSIS — Z01812 Encounter for preprocedural laboratory examination: Secondary | ICD-10-CM | POA: Diagnosis not present

## 2018-08-15 DIAGNOSIS — E119 Type 2 diabetes mellitus without complications: Secondary | ICD-10-CM | POA: Diagnosis not present

## 2018-08-15 DIAGNOSIS — R972 Elevated prostate specific antigen [PSA]: Secondary | ICD-10-CM | POA: Diagnosis not present

## 2018-08-15 DIAGNOSIS — Z8673 Personal history of transient ischemic attack (TIA), and cerebral infarction without residual deficits: Secondary | ICD-10-CM | POA: Diagnosis not present

## 2018-08-15 DIAGNOSIS — Z794 Long term (current) use of insulin: Secondary | ICD-10-CM | POA: Diagnosis not present

## 2018-08-15 DIAGNOSIS — Z7984 Long term (current) use of oral hypoglycemic drugs: Secondary | ICD-10-CM | POA: Diagnosis not present

## 2018-08-29 ENCOUNTER — Encounter: Payer: Self-pay | Admitting: Podiatry

## 2018-08-29 ENCOUNTER — Other Ambulatory Visit: Payer: Self-pay

## 2018-08-29 ENCOUNTER — Ambulatory Visit (INDEPENDENT_AMBULATORY_CARE_PROVIDER_SITE_OTHER): Payer: Medicare HMO | Admitting: Podiatry

## 2018-08-29 ENCOUNTER — Ambulatory Visit: Payer: Medicare HMO | Admitting: Podiatry

## 2018-08-29 VITALS — Temp 97.6°F

## 2018-08-29 DIAGNOSIS — E0843 Diabetes mellitus due to underlying condition with diabetic autonomic (poly)neuropathy: Secondary | ICD-10-CM

## 2018-08-29 DIAGNOSIS — L97412 Non-pressure chronic ulcer of right heel and midfoot with fat layer exposed: Secondary | ICD-10-CM

## 2018-08-31 NOTE — Progress Notes (Signed)
   Subjective:  73 y.o. male with PMHx of diabetes mellitus presenting today for follow-up evaluation of an ulcer to the plantar aspect of the right foot. She states she is doing well. She has been using Neosporin ointment as directed. She denies any pain or modifying factors. Patient is here for further evaluation and treatment.   Past Medical History:  Diagnosis Date  . Acute upper respiratory infection 02/18/2014  . Allergy   . Arthritis   . Carotid artery disease ([place]) 05/11/2013  . Chicken pox as a child  . Diabetes mellitus without complication ([place])   . Fall    every since his stroke  . GERD (gastroesophageal reflux disease)   . Measles   . Other and unspecified hyperlipidemia 05/11/2013  . Peripheral neuropathy   . Poor dental hygiene 05/11/2013  . Shingles 73 yrs old  . Sleep apnea    [facility]  . Stroke [facility]) 2007  . Vocal cord dysfunction       Objective/Physical Exam General: The patient is alert and oriented x3 in no acute distress.  Dermatology:  Wound #1 noted to the right heel measuring 2.8 x 1.4 x 0.3 cm (LxWxD).   To the noted ulceration(s), there is no eschar. There is a moderate amount of slough, fibrin, and necrotic tissue noted. Granulation tissue and wound base is red. There is a minimal amount of serosanguineous drainage noted. There is no exposed bone muscle-tendon ligament or joint. There is no malodor. Periwound integrity is intact. Skin is warm, dry and supple bilateral lower extremities.  Vascular: Palpable pedal pulses bilaterally. No edema or erythema noted. Capillary refill within normal limits.  Neurological: Epicritic and protective threshold diminished bilaterally.   Musculoskeletal Exam: Range of motion within normal limits to all pedal and ankle joints bilateral. Muscle strength 5/5 in all groups bilateral.   Assessment: 1. Ulceration of the right heel secondary to diabetes mellitus 2. diabetes mellitus w/ peripheral  neuropathy   Plan of Care:  1. Patient was evaluated. 2. medically necessary excisional debridement including subcutaneous tissue was performed using a tissue nipper and a chisel blade. Excisional debridement of all the necrotic nonviable tissue down to healthy bleeding viable tissue was performed with post-debridement measurements same as pre-. 3. the wound was cleansed and dry sterile dressing applied. 4. Continue using collagen daily with a dry sterile dressing.  5. Recommended good shoe gear.  6. Return to clinic in 3 weeks.   Edrick Kins, DPM [facility]  Dr. Edrick Kins, [place]                                        [place], [place] [address]                Office [phone]  Fax [phone]

## 2018-09-08 DIAGNOSIS — C61 Malignant neoplasm of prostate: Secondary | ICD-10-CM | POA: Diagnosis not present

## 2018-09-08 DIAGNOSIS — N5237 Erectile dysfunction following prostate ablative therapy: Secondary | ICD-10-CM | POA: Diagnosis not present

## 2018-09-19 ENCOUNTER — Ambulatory Visit: Payer: Medicare HMO | Admitting: Podiatry

## 2018-10-15 LAB — HEMOGLOBIN A1C: Hemoglobin A1C: 6.8

## 2018-12-01 DIAGNOSIS — C61 Malignant neoplasm of prostate: Secondary | ICD-10-CM | POA: Diagnosis not present

## 2018-12-08 DIAGNOSIS — C61 Malignant neoplasm of prostate: Secondary | ICD-10-CM | POA: Diagnosis not present

## 2018-12-08 DIAGNOSIS — R972 Elevated prostate specific antigen [PSA]: Secondary | ICD-10-CM | POA: Diagnosis not present

## 2018-12-08 DIAGNOSIS — N5237 Erectile dysfunction following prostate ablative therapy: Secondary | ICD-10-CM | POA: Diagnosis not present

## 2018-12-22 DIAGNOSIS — C61 Malignant neoplasm of prostate: Secondary | ICD-10-CM | POA: Diagnosis not present

## 2018-12-27 DIAGNOSIS — C61 Malignant neoplasm of prostate: Secondary | ICD-10-CM | POA: Diagnosis not present

## 2018-12-29 ENCOUNTER — Ambulatory Visit (INDEPENDENT_AMBULATORY_CARE_PROVIDER_SITE_OTHER): Payer: Medicare HMO | Admitting: Physician Assistant

## 2018-12-29 ENCOUNTER — Encounter: Payer: Self-pay | Admitting: Physician Assistant

## 2018-12-29 ENCOUNTER — Other Ambulatory Visit: Payer: Self-pay

## 2018-12-29 VITALS — BP 147/66 | HR 76 | Temp 98.1°F | Wt 239.0 lb

## 2018-12-29 DIAGNOSIS — L97221 Non-pressure chronic ulcer of left calf limited to breakdown of skin: Secondary | ICD-10-CM | POA: Diagnosis not present

## 2018-12-29 DIAGNOSIS — I872 Venous insufficiency (chronic) (peripheral): Secondary | ICD-10-CM

## 2018-12-29 NOTE — Patient Instructions (Signed)
Venous Ulcer A venous ulcer is a shallow sore on your lower leg. Venous ulcer is the most common type of lower leg ulcer. You may have venous ulcers on one leg or on both legs. This condition most often develops around your ankles. This type of ulcer may last for a long time (chronic ulcer) or it may return often (recurrent ulcer). What are the causes? This condition is caused by poor blood flow in your legs. The poor flow causes blood to pool in your legs. This can break the skin, causing an ulcer. What increases the risk? You are more likely to develop this condition if:  You are 72 years of age or older.  You are male.  You are overweight.  You are not active.  You have had a leg ulcer in the past.  You have varicose veins.  You have clots in your lower leg veins (deep vein thrombosis).  You have inflammation of your leg veins (phlebitis).  You have recently been pregnant.  You smoke. What are the signs or symptoms? The main symptom of this condition is an open sore near your ankle. Other symptoms may include:  Swelling.  Thick skin.  Fluid coming from the ulcer.  Bleeding.  Itching.  Pain and swelling. This gets worse when you stand up and feels better when you raise your leg.  Blotchy skin.  Dark skin. How is this treated? This condition may be treated by:  Keeping your leg raised (elevated).  Wearing a type of bandage or stocking to keep pressure (compression) on the veins of your leg.  Taking medicines, including antibiotic medicines.  Cleaning your ulcer and removing any dead tissue from the wound.  Using bandages and wraps that have medicines in them to cover your ulcer.  Closing the wound using a piece of skin taken from another area of your body (graft). Follow these instructions at home: Medicines  Take or apply over-the-counter and prescription medicines only as told by your doctor.  If you were prescribed an antibiotic medicine, take it  as told by your doctor. Do not stop using the antibiotic even if you start to feel better.  Ask your doctor if you should take aspirin before long trips. Wound care  Follow instructions from your doctor about how to take care of your wound. Make sure you: ? Wash your hands with soap and water before and after you change your bandage (dressing). If you cannot use soap and water, use hand sanitizer. ? Change your bandage as told by your doctor. ? If you had a skin graft, leave stitches (sutures) in place. These may need to stay in place for 2 weeks or longer. ? Ask when you should remove your bandage. If your bandage is dry and sticks to your leg when you try to remove it, moisten or wet the bandage with saline solution or water to make it easier to remove.  Once your bandage is off, check your wound each day for signs of infection. Have a caregiver do this for you if you are not able to do it yourself. Check for: ? More redness, swelling, or pain. ? More fluid or blood. ? Warmth. ? Pus or a bad smell. Activity  Do not sit for a long time without moving. Get up to take short walks every 1-2 hours. This is important. Ask for help if you feel weak or unsteady.  Ask your doctor what level of activity is safe for you.  Rest with  your legs raised during the day. If you can, keep your legs above the level of your heart for 30 minutes, 3-4 times a day, or as told by your doctor.  Do not sit with your legs crossed. General instructions   Wear elastic stockings, compression stockings, or support hose as told by your doctor.  Raise the foot of your bed as told by your doctor.  Do not use any products that contain nicotine or tobacco, such as cigarettes, e-cigarettes, and chewing tobacco. If you need help quitting, ask your doctor.  Keep all follow-up visits as told by your doctor. This is important. Contact a doctor if:  Your ulcer is getting larger or is not healing.  Your pain gets  worse. Get help right away if:  You have more redness, swelling, or pain around your ulcer.  You have more fluid or blood coming from your ulcer.  Your ulcer feels warm to the touch.  You have pus or a bad smell coming from your ulcer.  You have a fever. Summary  A venous ulcer is a shallow sore on your lower leg.  Follow instructions from your doctor about how to take care of your wound.  Check your wound each day for signs of infection.  Take over-the-counter and prescription medicines only as told by your doctor.  Keep all follow-up visits as told by your doctor. This is important. This information is not intended to replace advice given to you by your health care provider. Make sure you discuss any questions you have with your health care provider. Document Released: 02/13/2004 Document Revised: 09/02/2017 Document Reviewed: 09/02/2017 Elsevier Patient Education  2020 [employer].

## 2018-12-29 NOTE — Progress Notes (Signed)
HPI:                                                                LYDELL GRIMSTEAD is a 73 y.o. male who presents to [facility]: Primary Care Sports Medicine today for left leg wound  Patient with past medical history of peripheral vascular disease, CAD, controlled type 2 diabetes with neuropathy and recurrent prostate cancer reports a nonhealing wound of his left lower leg that has been present for at least the last month.  He is seen his doctor at the [facility] for this and has been treated with antibiotics.  He reports antibiotics were not helpful.  The wound is painful and weeps a clear fluid.  It is gradually worsening and new wounds are forming around it.  It is associated with leg swelling that is chronic.  Denies history of DVT.  Denies new or worsening leg swelling, redness, calf tenderness.  Denies chest pain or shortness of breath. He takes baby aspirin 81 mg for coronary artery disease  He had a recent PET scan on December 22, 2018 for prostate cancer that showed no evidence of metastasis.  He has a history of a diabetic foot ulcer on the right side that was last treated by wound care on April 05, 2018.  He reports no issues with this foot at this time.  Past Medical History:  Diagnosis Date  . Acute upper respiratory infection 02/18/2014  . Allergy   . Arthritis   . Carotid artery disease ([place]) 05/11/2013  . Chicken pox as a child  . Diabetes mellitus without complication ([place])   . Fall    every since his stroke  . GERD (gastroesophageal reflux disease)   . Measles   . Other and unspecified hyperlipidemia 05/11/2013  . Peripheral neuropathy   . Poor dental hygiene 05/11/2013  . Shingles 73 yrs old  . Sleep apnea    [facility]  . Stroke [facility]) 2007  . Vocal cord dysfunction    Past Surgical History:  Procedure Laterality Date  . APPENDECTOMY  2000   ruptured  . CAROTID ENDARTERECTOMY  09/2010  . EYE SURGERY  2010   cataracts bilateral  . IR  RADIOLOGIST EVAL & MGMT  06/08/2018   Social History   Tobacco Use  . Smoking status: Former Smoker    Years: 10.00    Types: Cigars    Start date: 11/27/1997  . Smokeless tobacco: Never Used  Substance Use Topics  . Alcohol use: Yes    Alcohol/week: 0.0 standard drinks    Comment: occasionally drinks wine    family history includes Cancer in his mother; Heart attack in his brother and father; Stroke in his brother and brother.    ROS: negative except as noted in the HPI  Medications: Current Outpatient Medications  Medication Sig Dispense Refill  . acetaminophen (TYLENOL) 650 MG CR tablet Take by mouth.    Marland Kitchen aspirin 81 MG tablet Take 81 mg by mouth daily.    . insulin glargine (LANTUS) 100 UNIT/ML injection Inject 100 Units into the skin daily.     . metFORMIN (GLUCOPHAGE) 1000 MG tablet Take by mouth.    . pregabalin (LYRICA) 100 MG capsule Take 100 mg by mouth  2 (two) times daily.    Cathie Olden (SILVRSTAT WOUND DRESSING) GEL Apply 1 application topically daily. 28.35 g 11   No current facility-administered medications for this visit.   Allergies  Allergen Reactions  . Aggrenox [Aspirin-Dipyridamole Er] Shortness Of Breath  . Amoxicillin-Pot Clavulanate Nausea And Vomiting  . Atorvastatin Nausea And Vomiting  . Duloxetine Nausea And Vomiting  . Niacin Other (See Comments)       Objective:  BP (!) 147/66   Pulse 76   Temp 98.1 F (36.7 C) (Oral)   Wt 239 lb (108.4 kg)   BMI 31.53 kg/m  Gen:  alert, not ill-appearing, no distress, appropriate for age, obese male HEENT: head normocephalic without obvious abnormality, conjunctiva and cornea clear, trachea midline Pulm: Normal work of breathing, normal phonation Neuro: alert and oriented x 3, no tremor MSK: 2+ peripheral edema of bilateral lower extremities, dorsalis pedis pulses 1+ on the left, unable to palpate posterior tibialis secondary to ankle edema Skin: Left distal medial lower extremity with multiple  venous ulcers limited to skin breakdown      No results found for this or any previous visit (from the past 33 hour(s)). No results found.    Assessment and Plan: 73 y.o. male with   .Diagnoses and all orders for this visit:  Venous stasis ulcer of left calf limited to breakdown of skin without varicose veins ([place]) -     Cancel: Referral to [place] boot/body cast -     Ambulatory referral to [facility]   Patient placed in Unna boot dressing.  Counseled on Unna boot care and general measures for venous stasis ulcers and peripheral edema Referral placed to wound care Counseled on callback/ED precautions    Patient education and anticipatory guidance given Patient agrees with treatment plan Follow-up in 5 days for nurse visit and Unna boot change or sooner as needed if symptoms worsen or fail to improve  Darlyne Russian PA-C

## 2019-01-04 ENCOUNTER — Encounter: Payer: Self-pay | Admitting: Sports Medicine

## 2019-01-04 ENCOUNTER — Ambulatory Visit (INDEPENDENT_AMBULATORY_CARE_PROVIDER_SITE_OTHER): Payer: Medicare HMO | Admitting: Sports Medicine

## 2019-01-04 ENCOUNTER — Other Ambulatory Visit: Payer: Self-pay

## 2019-01-04 DIAGNOSIS — L97322 Non-pressure chronic ulcer of left ankle with fat layer exposed: Secondary | ICD-10-CM | POA: Diagnosis not present

## 2019-01-04 DIAGNOSIS — I872 Venous insufficiency (chronic) (peripheral): Secondary | ICD-10-CM | POA: Diagnosis not present

## 2019-01-04 DIAGNOSIS — I83023 Varicose veins of left lower extremity with ulcer of ankle: Secondary | ICD-10-CM | POA: Insufficient documentation

## 2019-01-04 NOTE — Progress Notes (Signed)
Pt in office today for a repeat [employer]. Pt is concerned that it is not heeling, patient is also concerned that he has been exposed to MRSA.  Pt requested to have Dr. Dianah Field to assess wound.,  Provider assessed wound and dressed with unna boot. Pt was advised to return in 1 week for weekly changes until healed.

## 2019-01-04 NOTE — Progress Notes (Signed)
Subjective:    CC: Venous stasis ulcers  HPI: Matthew [name] is a pleasant 72 year old male, he has chronic left-sided venous stasis ulcers, getting Unna boots.  He is here for follow-up, I am called to inspect the ulcers myself.  I reviewed the past medical history, family history, social history, surgical history, and allergies today and no changes were needed.  Please see the problem list section below in epic for further details.  Past Medical History: Past Medical History:  Diagnosis Date  . Acute upper respiratory infection 02/18/2014  . Allergy   . Arthritis   . Carotid artery disease ([place]) 05/11/2013  . Chicken pox as a child  . Diabetes mellitus without complication ([place])   . Fall    every since his stroke  . GERD (gastroesophageal reflux disease)   . Measles   . Other and unspecified hyperlipidemia 05/11/2013  . Peripheral neuropathy   . Poor dental hygiene 05/11/2013  . Shingles 73 yrs old  . Sleep apnea    [facility]  . Stroke [facility]) 2007  . Vocal cord dysfunction    Past Surgical History: Past Surgical History:  Procedure Laterality Date  . APPENDECTOMY  2000   ruptured  . CAROTID ENDARTERECTOMY  09/2010  . EYE SURGERY  2010   cataracts bilateral  . IR RADIOLOGIST EVAL & MGMT  06/08/2018   Social History: Social History   Socioeconomic History  . Marital status: Divorced    Spouse name: Not on file  . Number of children: Not on file  . Years of education: Not on file  . Highest education level: Not on file  Occupational History  . Occupation: Veteran  Tobacco Use  . Smoking status: Former Smoker    Years: 10.00    Types: Cigars    Start date: 11/27/1997  . Smokeless tobacco: Never Used  Substance and Sexual Activity  . Alcohol use: Yes    Alcohol/week: 0.0 standard drinks    Comment: occasionally drinks wine   . Drug use: No  . Sexual activity: Yes    Partners: Female    Comment: lives with roommate, minimal carbs  Other Topics Concern  .  Not on file  Social History Narrative  . Not on file   Social Determinants of Health   Financial Resource Strain:   . Difficulty of Paying Living Expenses: Not on file  Food Insecurity:   . Worried About Charity fundraiser in the Last Year: Not on file  . Ran Out of Food in the Last Year: Not on file  Transportation Needs:   . Lack of Transportation (Medical): Not on file  . Lack of Transportation (Non-Medical): Not on file  Physical Activity:   . Days of Exercise per Week: Not on file  . Minutes of Exercise per Session: Not on file  Stress:   . Feeling of Stress : Not on file  Social Connections:   . Frequency of Communication with Friends and Family: Not on file  . Frequency of Social Gatherings with Friends and Family: Not on file  . Attends Religious Services: Not on file  . Active Member of Clubs or Organizations: Not on file  . Attends Archivist Meetings: Not on file  . Marital Status: Not on file   Family History: Family History  Problem Relation Age of Onset  . Cancer Mother        kidney  . Heart attack Father   . Stroke Brother   .  Heart attack Brother   . Stroke Brother    Allergies: Allergies  Allergen Reactions  . Aggrenox [Aspirin-Dipyridamole Er] Shortness Of Breath  . Amoxicillin-Pot Clavulanate Nausea And Vomiting  . Atorvastatin Nausea And Vomiting  . Duloxetine Nausea And Vomiting  . Niacin Other (See Comments)   Medications: See med rec.  Review of Systems: No fevers, chills, night sweats, weight loss, chest pain, or shortness of breath.   Objective:    General: Well Developed, well nourished, and in no acute distress.  Neuro: Alert and oriented x3, extra-ocular muscles intact, sensation grossly intact.  HEENT: Normocephalic, atraumatic, pupils equal round reactive to light, neck supple, no masses, no lymphadenopathy, thyroid nonpalpable.  Skin: Warm and dry, no rashes. Cardiac: Regular rate and rhythm, no murmurs rubs or  gallops, no lower extremity edema.  Respiratory: Clear to auscultation bilaterally. Not using accessory muscles, speaking in full sentences. Left leg: Several ulcers on the left medial lower leg, down into the subcutaneous tissues, no signs of bacterial superinfection.  Unna boot applied.  Impression and Recommendations:    Venous stasis ulcer of left ankle (HCC) Venous stasis ulcer into the subcutaneous tissues, repeat Unna boot weekly until resolved.   ___________________________________________ Gwen Her. Dianah Field, M.D., ABFM., CAQSM. Primary Care and Sports Medicine [facility]  Adjunct Professor of [employer] [facility] [employer]

## 2019-01-04 NOTE — Assessment & Plan Note (Signed)
Venous stasis ulcer into the subcutaneous tissues, repeat Unna boot weekly until resolved.

## 2019-01-11 ENCOUNTER — Other Ambulatory Visit: Payer: Self-pay

## 2019-01-11 ENCOUNTER — Ambulatory Visit (INDEPENDENT_AMBULATORY_CARE_PROVIDER_SITE_OTHER): Payer: Medicare HMO | Admitting: Sports Medicine

## 2019-01-11 VITALS — BP 140/62 | HR 81 | Temp 98.7°F | Wt 252.0 lb

## 2019-01-11 DIAGNOSIS — I872 Venous insufficiency (chronic) (peripheral): Secondary | ICD-10-CM

## 2019-01-11 DIAGNOSIS — L97221 Non-pressure chronic ulcer of left calf limited to breakdown of skin: Secondary | ICD-10-CM | POA: Diagnosis not present

## 2019-01-11 NOTE — Progress Notes (Signed)
Pt in office today for unna boot change, Pt stated he did not want a new one to be placed, stated he could not leave it on. Assessed wound, which appears to be healing. Advised pt it needed another unna boot, but pt declined. I went over signs and symptoms of infection to watch for and advised pt to call us if any changes.

## 2019-02-03 DIAGNOSIS — C61 Malignant neoplasm of prostate: Secondary | ICD-10-CM | POA: Diagnosis not present

## 2019-02-03 DIAGNOSIS — N529 Male erectile dysfunction, unspecified: Secondary | ICD-10-CM | POA: Diagnosis not present

## 2019-03-24 DIAGNOSIS — I739 Peripheral vascular disease, unspecified: Secondary | ICD-10-CM | POA: Diagnosis not present

## 2019-03-24 DIAGNOSIS — L97321 Non-pressure chronic ulcer of left ankle limited to breakdown of skin: Secondary | ICD-10-CM | POA: Diagnosis not present

## 2019-03-24 DIAGNOSIS — I87332 Chronic venous hypertension (idiopathic) with ulcer and inflammation of left lower extremity: Secondary | ICD-10-CM | POA: Diagnosis not present

## 2019-04-07 DIAGNOSIS — D72829 Elevated white blood cell count, unspecified: Secondary | ICD-10-CM | POA: Diagnosis not present

## 2019-04-07 DIAGNOSIS — N183 Chronic kidney disease, stage 3 unspecified: Secondary | ICD-10-CM | POA: Diagnosis not present

## 2019-04-07 DIAGNOSIS — A409 Streptococcal sepsis, unspecified: Secondary | ICD-10-CM | POA: Diagnosis not present

## 2019-04-07 DIAGNOSIS — G9340 Encephalopathy, unspecified: Secondary | ICD-10-CM | POA: Diagnosis not present

## 2019-04-07 DIAGNOSIS — R000 Tachycardia, unspecified: Secondary | ICD-10-CM | POA: Diagnosis not present

## 2019-04-07 DIAGNOSIS — Z8673 Personal history of transient ischemic attack (TIA), and cerebral infarction without residual deficits: Secondary | ICD-10-CM | POA: Diagnosis not present

## 2019-04-07 DIAGNOSIS — I80231 Phlebitis and thrombophlebitis of right tibial vein: Secondary | ICD-10-CM | POA: Diagnosis not present

## 2019-04-07 DIAGNOSIS — I499 Cardiac arrhythmia, unspecified: Secondary | ICD-10-CM | POA: Diagnosis not present

## 2019-04-07 DIAGNOSIS — T6701XA Heatstroke and sunstroke, initial encounter: Secondary | ICD-10-CM | POA: Diagnosis not present

## 2019-04-07 DIAGNOSIS — R404 Transient alteration of awareness: Secondary | ICD-10-CM | POA: Diagnosis not present

## 2019-04-07 DIAGNOSIS — R6520 Severe sepsis without septic shock: Secondary | ICD-10-CM | POA: Diagnosis not present

## 2019-04-07 DIAGNOSIS — R4781 Slurred speech: Secondary | ICD-10-CM | POA: Diagnosis not present

## 2019-04-07 DIAGNOSIS — E1169 Type 2 diabetes mellitus with other specified complication: Secondary | ICD-10-CM | POA: Diagnosis not present

## 2019-04-07 DIAGNOSIS — E1165 Type 2 diabetes mellitus with hyperglycemia: Secondary | ICD-10-CM | POA: Diagnosis not present

## 2019-04-07 DIAGNOSIS — R509 Fever, unspecified: Secondary | ICD-10-CM | POA: Diagnosis not present

## 2019-04-07 DIAGNOSIS — I82541 Chronic embolism and thrombosis of right tibial vein: Secondary | ICD-10-CM | POA: Diagnosis not present

## 2019-04-07 DIAGNOSIS — Z8546 Personal history of malignant neoplasm of prostate: Secondary | ICD-10-CM | POA: Diagnosis not present

## 2019-04-07 DIAGNOSIS — E1140 Type 2 diabetes mellitus with diabetic neuropathy, unspecified: Secondary | ICD-10-CM | POA: Diagnosis not present

## 2019-04-07 DIAGNOSIS — Z20822 Contact with and (suspected) exposure to covid-19: Secondary | ICD-10-CM | POA: Diagnosis not present

## 2019-04-07 DIAGNOSIS — E860 Dehydration: Secondary | ICD-10-CM | POA: Diagnosis not present

## 2019-04-07 DIAGNOSIS — K089 Disorder of teeth and supporting structures, unspecified: Secondary | ICD-10-CM | POA: Diagnosis not present

## 2019-04-07 DIAGNOSIS — E1122 Type 2 diabetes mellitus with diabetic chronic kidney disease: Secondary | ICD-10-CM | POA: Diagnosis not present

## 2019-04-07 DIAGNOSIS — E872 Acidosis: Secondary | ICD-10-CM | POA: Diagnosis not present

## 2019-04-07 DIAGNOSIS — R4182 Altered mental status, unspecified: Secondary | ICD-10-CM | POA: Diagnosis not present

## 2019-04-07 DIAGNOSIS — Z87891 Personal history of nicotine dependence: Secondary | ICD-10-CM | POA: Diagnosis not present

## 2019-04-07 DIAGNOSIS — E119 Type 2 diabetes mellitus without complications: Secondary | ICD-10-CM | POA: Diagnosis not present

## 2019-04-07 DIAGNOSIS — Z794 Long term (current) use of insulin: Secondary | ICD-10-CM | POA: Diagnosis not present

## 2019-04-07 DIAGNOSIS — B955 Unspecified streptococcus as the cause of diseases classified elsewhere: Secondary | ICD-10-CM | POA: Diagnosis not present

## 2019-04-07 DIAGNOSIS — A419 Sepsis, unspecified organism: Secondary | ICD-10-CM | POA: Diagnosis not present

## 2019-04-07 DIAGNOSIS — R930 Abnormal findings on diagnostic imaging of skull and head, not elsewhere classified: Secondary | ICD-10-CM | POA: Diagnosis not present

## 2019-04-07 DIAGNOSIS — L97521 Non-pressure chronic ulcer of other part of left foot limited to breakdown of skin: Secondary | ICD-10-CM | POA: Diagnosis not present

## 2019-04-07 DIAGNOSIS — R7881 Bacteremia: Secondary | ICD-10-CM | POA: Diagnosis not present

## 2019-04-07 DIAGNOSIS — G9341 Metabolic encephalopathy: Secondary | ICD-10-CM | POA: Diagnosis not present

## 2019-04-13 DIAGNOSIS — I69322 Dysarthria following cerebral infarction: Secondary | ICD-10-CM | POA: Diagnosis not present

## 2019-04-13 DIAGNOSIS — G4733 Obstructive sleep apnea (adult) (pediatric): Secondary | ICD-10-CM | POA: Diagnosis not present

## 2019-04-13 DIAGNOSIS — A408 Other streptococcal sepsis: Secondary | ICD-10-CM | POA: Diagnosis not present

## 2019-04-13 DIAGNOSIS — E119 Type 2 diabetes mellitus without complications: Secondary | ICD-10-CM | POA: Diagnosis not present

## 2019-04-13 DIAGNOSIS — C61 Malignant neoplasm of prostate: Secondary | ICD-10-CM | POA: Diagnosis not present

## 2019-04-13 DIAGNOSIS — N529 Male erectile dysfunction, unspecified: Secondary | ICD-10-CM | POA: Diagnosis not present

## 2019-04-13 DIAGNOSIS — M5412 Radiculopathy, cervical region: Secondary | ICD-10-CM | POA: Diagnosis not present

## 2019-04-13 DIAGNOSIS — A419 Sepsis, unspecified organism: Secondary | ICD-10-CM | POA: Diagnosis not present

## 2019-04-13 DIAGNOSIS — Z452 Encounter for adjustment and management of vascular access device: Secondary | ICD-10-CM | POA: Diagnosis not present

## 2019-04-13 DIAGNOSIS — Z9181 History of falling: Secondary | ICD-10-CM | POA: Diagnosis not present

## 2019-04-14 ENCOUNTER — Other Ambulatory Visit: Payer: Self-pay

## 2019-04-14 MED ORDER — ACETAMINOPHEN 325 MG PO TABS
650.00 | ORAL_TABLET | ORAL | Status: DC
Start: ? — End: 2019-04-14

## 2019-04-14 MED ORDER — DEXTROSE 10 % IV SOLN
125.00 | INTRAVENOUS | Status: DC
Start: ? — End: 2019-04-14

## 2019-04-14 MED ORDER — ENOXAPARIN SODIUM 40 MG/0.4ML ~~LOC~~ SOLN
40.00 | SUBCUTANEOUS | Status: DC
Start: 2019-04-13 — End: 2019-04-14

## 2019-04-14 MED ORDER — GENERIC EXTERNAL MEDICATION
2.00 | Status: DC
Start: 2019-04-13 — End: 2019-04-14

## 2019-04-14 MED ORDER — POLYETHYLENE GLYCOL 3350 17 GM/SCOOP PO POWD
17.00 | ORAL | Status: DC
Start: ? — End: 2019-04-14

## 2019-04-14 MED ORDER — GLUCOSE 40 % PO GEL
15.00 | ORAL | Status: DC
Start: ? — End: 2019-04-14

## 2019-04-14 MED ORDER — SODIUM CHLORIDE FLUSH 0.9 % IV SOLN
20.00 | INTRAVENOUS | Status: DC
Start: 2019-04-13 — End: 2019-04-14

## 2019-04-14 MED ORDER — INSULIN GLARGINE 100 UNIT/ML ~~LOC~~ SOLN
40.00 | SUBCUTANEOUS | Status: DC
Start: 2019-04-13 — End: 2019-04-14

## 2019-04-14 MED ORDER — ASPIRIN 81 MG PO CHEW
81.00 | CHEWABLE_TABLET | ORAL | Status: DC
Start: 2019-04-13 — End: 2019-04-14

## 2019-04-14 MED ORDER — SODIUM CHLORIDE FLUSH 0.9 % IV SOLN
20.00 | INTRAVENOUS | Status: DC
Start: ? — End: 2019-04-14

## 2019-04-14 MED ORDER — INSULIN LISPRO 100 UNIT/ML ~~LOC~~ SOLN
2.00 | SUBCUTANEOUS | Status: DC
Start: 2019-04-12 — End: 2019-04-14

## 2019-04-14 MED ORDER — PREGABALIN 75 MG PO CAPS
150.00 | ORAL_CAPSULE | ORAL | Status: DC
Start: 2019-04-12 — End: 2019-04-14

## 2019-04-14 NOTE — Patient Outreach (Signed)
[facility]) Care Management  04/14/2019  Matthew [name] [birth year] [id number]     Transition of Care Referral  Referral Date: 04/14/2019 Referral Source: [facility] Discharge Report Date of Discharge: 04/12/2019 Facility: [place]: [facility]    Outreach attempt # 1 to patient. No answer at present.    Plan: RN CM will make outreach attempt to patient within 3-4 business days. RN CM will send unsuccessful outreach letter to patient.   Enzo Montgomery, RN,BSN,CCM [place] [facility] Telephonic Care Management Coordinator Direct Phone: [phone] Toll Free: [phone] Fax: [phone]

## 2019-04-17 ENCOUNTER — Other Ambulatory Visit: Payer: Self-pay

## 2019-04-17 DIAGNOSIS — Z9181 History of falling: Secondary | ICD-10-CM | POA: Diagnosis not present

## 2019-04-17 DIAGNOSIS — Z452 Encounter for adjustment and management of vascular access device: Secondary | ICD-10-CM | POA: Diagnosis not present

## 2019-04-17 DIAGNOSIS — N529 Male erectile dysfunction, unspecified: Secondary | ICD-10-CM | POA: Diagnosis not present

## 2019-04-17 DIAGNOSIS — C61 Malignant neoplasm of prostate: Secondary | ICD-10-CM | POA: Diagnosis not present

## 2019-04-17 DIAGNOSIS — I69322 Dysarthria following cerebral infarction: Secondary | ICD-10-CM | POA: Diagnosis not present

## 2019-04-17 DIAGNOSIS — G4733 Obstructive sleep apnea (adult) (pediatric): Secondary | ICD-10-CM | POA: Diagnosis not present

## 2019-04-17 DIAGNOSIS — A408 Other streptococcal sepsis: Secondary | ICD-10-CM | POA: Diagnosis not present

## 2019-04-17 DIAGNOSIS — M5412 Radiculopathy, cervical region: Secondary | ICD-10-CM | POA: Diagnosis not present

## 2019-04-17 DIAGNOSIS — E119 Type 2 diabetes mellitus without complications: Secondary | ICD-10-CM | POA: Diagnosis not present

## 2019-04-17 NOTE — Patient Outreach (Signed)
[facility]) Care Management  04/17/2019  Matthew [name] 05/03/1945 [id number]   Transition of Care Referral  Referral Date: 04/14/2019 Referral Source: [facility] Discharge Report Date of Discharge: 04/12/2019 Facility: [place]: [facility]    Outreach attempt #2 to patient. Spoke with patient who reported he was having difficulty hearing on the phone and deferred call to his spouse. She reports patient is doing well. [facility] services in place and [facility] coming today. She confirms patient has all his meds in the home and no issues or concerns regarding them. They have not made MD follow up appt but encouraged to do so. Spouse will follow up. She reports that patient is followed by PCP-Dr. Clearance Coots listed as [facility] MD. Patient is not eligible for services.       Plan: RN CM close case at this time.    Enzo Montgomery, RN,BSN,CCM [place] Management Telephonic Care Management Coordinator Direct Phone: [phone] Toll Free: [phone] Fax: [phone]

## 2019-04-24 DIAGNOSIS — C61 Malignant neoplasm of prostate: Secondary | ICD-10-CM | POA: Diagnosis not present

## 2019-04-24 DIAGNOSIS — A408 Other streptococcal sepsis: Secondary | ICD-10-CM | POA: Diagnosis not present

## 2019-04-24 DIAGNOSIS — Z9181 History of falling: Secondary | ICD-10-CM | POA: Diagnosis not present

## 2019-04-24 DIAGNOSIS — Z452 Encounter for adjustment and management of vascular access device: Secondary | ICD-10-CM | POA: Diagnosis not present

## 2019-04-24 DIAGNOSIS — G4733 Obstructive sleep apnea (adult) (pediatric): Secondary | ICD-10-CM | POA: Diagnosis not present

## 2019-04-24 DIAGNOSIS — M5412 Radiculopathy, cervical region: Secondary | ICD-10-CM | POA: Diagnosis not present

## 2019-04-24 DIAGNOSIS — N529 Male erectile dysfunction, unspecified: Secondary | ICD-10-CM | POA: Diagnosis not present

## 2019-04-24 DIAGNOSIS — E119 Type 2 diabetes mellitus without complications: Secondary | ICD-10-CM | POA: Diagnosis not present

## 2019-04-24 DIAGNOSIS — I69322 Dysarthria following cerebral infarction: Secondary | ICD-10-CM | POA: Diagnosis not present

## 2019-04-26 DIAGNOSIS — Z452 Encounter for adjustment and management of vascular access device: Secondary | ICD-10-CM | POA: Diagnosis not present

## 2019-04-26 DIAGNOSIS — I69322 Dysarthria following cerebral infarction: Secondary | ICD-10-CM | POA: Diagnosis not present

## 2019-04-26 DIAGNOSIS — Z9181 History of falling: Secondary | ICD-10-CM | POA: Diagnosis not present

## 2019-04-26 DIAGNOSIS — N529 Male erectile dysfunction, unspecified: Secondary | ICD-10-CM | POA: Diagnosis not present

## 2019-04-26 DIAGNOSIS — E119 Type 2 diabetes mellitus without complications: Secondary | ICD-10-CM | POA: Diagnosis not present

## 2019-04-26 DIAGNOSIS — C61 Malignant neoplasm of prostate: Secondary | ICD-10-CM | POA: Diagnosis not present

## 2019-04-26 DIAGNOSIS — A408 Other streptococcal sepsis: Secondary | ICD-10-CM | POA: Diagnosis not present

## 2019-04-26 DIAGNOSIS — M5412 Radiculopathy, cervical region: Secondary | ICD-10-CM | POA: Diagnosis not present

## 2019-04-26 DIAGNOSIS — G4733 Obstructive sleep apnea (adult) (pediatric): Secondary | ICD-10-CM | POA: Diagnosis not present

## 2019-04-27 DIAGNOSIS — C61 Malignant neoplasm of prostate: Secondary | ICD-10-CM | POA: Diagnosis not present

## 2019-04-27 DIAGNOSIS — Z452 Encounter for adjustment and management of vascular access device: Secondary | ICD-10-CM | POA: Diagnosis not present

## 2019-04-27 DIAGNOSIS — A408 Other streptococcal sepsis: Secondary | ICD-10-CM | POA: Diagnosis not present

## 2019-04-27 DIAGNOSIS — Z794 Long term (current) use of insulin: Secondary | ICD-10-CM | POA: Diagnosis not present

## 2019-04-27 DIAGNOSIS — E119 Type 2 diabetes mellitus without complications: Secondary | ICD-10-CM | POA: Diagnosis not present

## 2019-04-28 DIAGNOSIS — I82541 Chronic embolism and thrombosis of right tibial vein: Secondary | ICD-10-CM | POA: Diagnosis not present

## 2019-04-28 DIAGNOSIS — G4733 Obstructive sleep apnea (adult) (pediatric): Secondary | ICD-10-CM | POA: Diagnosis not present

## 2019-04-28 DIAGNOSIS — C61 Malignant neoplasm of prostate: Secondary | ICD-10-CM | POA: Diagnosis not present

## 2019-04-28 DIAGNOSIS — I872 Venous insufficiency (chronic) (peripheral): Secondary | ICD-10-CM | POA: Diagnosis not present

## 2019-04-28 DIAGNOSIS — I69322 Dysarthria following cerebral infarction: Secondary | ICD-10-CM | POA: Diagnosis not present

## 2019-04-28 DIAGNOSIS — E119 Type 2 diabetes mellitus without complications: Secondary | ICD-10-CM | POA: Diagnosis not present

## 2019-04-28 DIAGNOSIS — Z9181 History of falling: Secondary | ICD-10-CM | POA: Diagnosis not present

## 2019-04-28 DIAGNOSIS — Z452 Encounter for adjustment and management of vascular access device: Secondary | ICD-10-CM | POA: Diagnosis not present

## 2019-04-28 DIAGNOSIS — A408 Other streptococcal sepsis: Secondary | ICD-10-CM | POA: Diagnosis not present

## 2019-04-28 DIAGNOSIS — M5412 Radiculopathy, cervical region: Secondary | ICD-10-CM | POA: Diagnosis not present

## 2019-04-28 DIAGNOSIS — N529 Male erectile dysfunction, unspecified: Secondary | ICD-10-CM | POA: Diagnosis not present

## 2019-05-05 DIAGNOSIS — C61 Malignant neoplasm of prostate: Secondary | ICD-10-CM | POA: Diagnosis not present

## 2019-05-22 DIAGNOSIS — C61 Malignant neoplasm of prostate: Secondary | ICD-10-CM | POA: Diagnosis not present

## 2019-05-22 DIAGNOSIS — Z87891 Personal history of nicotine dependence: Secondary | ICD-10-CM | POA: Diagnosis not present

## 2019-05-22 DIAGNOSIS — Z191 Hormone sensitive malignancy status: Secondary | ICD-10-CM | POA: Diagnosis not present

## 2019-07-04 DIAGNOSIS — C61 Malignant neoplasm of prostate: Secondary | ICD-10-CM | POA: Diagnosis not present

## 2019-07-04 DIAGNOSIS — R9721 Rising PSA following treatment for malignant neoplasm of prostate: Secondary | ICD-10-CM | POA: Diagnosis not present

## 2019-07-04 DIAGNOSIS — N529 Male erectile dysfunction, unspecified: Secondary | ICD-10-CM | POA: Diagnosis not present

## 2019-07-25 DIAGNOSIS — C61 Malignant neoplasm of prostate: Secondary | ICD-10-CM | POA: Diagnosis not present

## 2019-07-27 DIAGNOSIS — E782 Mixed hyperlipidemia: Secondary | ICD-10-CM | POA: Diagnosis not present

## 2019-07-27 DIAGNOSIS — C61 Malignant neoplasm of prostate: Secondary | ICD-10-CM | POA: Diagnosis not present

## 2019-07-27 DIAGNOSIS — Z794 Long term (current) use of insulin: Secondary | ICD-10-CM | POA: Diagnosis not present

## 2019-07-27 DIAGNOSIS — S81801A Unspecified open wound, right lower leg, initial encounter: Secondary | ICD-10-CM | POA: Diagnosis not present

## 2019-07-27 DIAGNOSIS — E1142 Type 2 diabetes mellitus with diabetic polyneuropathy: Secondary | ICD-10-CM | POA: Diagnosis not present

## 2019-08-03 DIAGNOSIS — R9721 Rising PSA following treatment for malignant neoplasm of prostate: Secondary | ICD-10-CM | POA: Diagnosis not present

## 2019-08-08 DIAGNOSIS — S81801A Unspecified open wound, right lower leg, initial encounter: Secondary | ICD-10-CM | POA: Diagnosis not present

## 2019-08-18 DIAGNOSIS — C61 Malignant neoplasm of prostate: Secondary | ICD-10-CM | POA: Diagnosis not present

## 2019-08-30 DIAGNOSIS — Z191 Hormone sensitive malignancy status: Secondary | ICD-10-CM | POA: Diagnosis not present

## 2019-08-30 DIAGNOSIS — C61 Malignant neoplasm of prostate: Secondary | ICD-10-CM | POA: Diagnosis not present

## 2019-09-26 DIAGNOSIS — C61 Malignant neoplasm of prostate: Secondary | ICD-10-CM | POA: Diagnosis not present

## 2019-10-23 DIAGNOSIS — E11622 Type 2 diabetes mellitus with other skin ulcer: Secondary | ICD-10-CM | POA: Diagnosis not present

## 2019-10-23 DIAGNOSIS — L97919 Non-pressure chronic ulcer of unspecified part of right lower leg with unspecified severity: Secondary | ICD-10-CM | POA: Diagnosis not present

## 2019-11-22 DIAGNOSIS — C61 Malignant neoplasm of prostate: Secondary | ICD-10-CM | POA: Diagnosis not present

## 2019-11-23 DIAGNOSIS — C61 Malignant neoplasm of prostate: Secondary | ICD-10-CM | POA: Diagnosis not present

## 2019-11-27 DIAGNOSIS — C61 Malignant neoplasm of prostate: Secondary | ICD-10-CM | POA: Diagnosis not present

## 2019-11-27 DIAGNOSIS — Z191 Hormone sensitive malignancy status: Secondary | ICD-10-CM | POA: Diagnosis not present

## 2019-12-01 DIAGNOSIS — Z510 Encounter for antineoplastic radiation therapy: Secondary | ICD-10-CM | POA: Diagnosis not present

## 2019-12-01 DIAGNOSIS — C61 Malignant neoplasm of prostate: Secondary | ICD-10-CM | POA: Diagnosis not present

## 2019-12-01 DIAGNOSIS — Z191 Hormone sensitive malignancy status: Secondary | ICD-10-CM | POA: Diagnosis not present

## 2019-12-05 DIAGNOSIS — C61 Malignant neoplasm of prostate: Secondary | ICD-10-CM | POA: Diagnosis not present

## 2019-12-06 DIAGNOSIS — C61 Malignant neoplasm of prostate: Secondary | ICD-10-CM | POA: Diagnosis not present

## 2019-12-06 DIAGNOSIS — Z510 Encounter for antineoplastic radiation therapy: Secondary | ICD-10-CM | POA: Diagnosis not present

## 2019-12-06 DIAGNOSIS — Z191 Hormone sensitive malignancy status: Secondary | ICD-10-CM | POA: Diagnosis not present

## 2019-12-08 DIAGNOSIS — K5730 Diverticulosis of large intestine without perforation or abscess without bleeding: Secondary | ICD-10-CM | POA: Diagnosis not present

## 2019-12-08 DIAGNOSIS — Z794 Long term (current) use of insulin: Secondary | ICD-10-CM | POA: Diagnosis not present

## 2019-12-08 DIAGNOSIS — R509 Fever, unspecified: Secondary | ICD-10-CM | POA: Diagnosis not present

## 2019-12-08 DIAGNOSIS — E11622 Type 2 diabetes mellitus with other skin ulcer: Secondary | ICD-10-CM | POA: Diagnosis not present

## 2019-12-08 DIAGNOSIS — R918 Other nonspecific abnormal finding of lung field: Secondary | ICD-10-CM | POA: Diagnosis not present

## 2019-12-08 DIAGNOSIS — L97412 Non-pressure chronic ulcer of right heel and midfoot with fat layer exposed: Secondary | ICD-10-CM | POA: Diagnosis not present

## 2019-12-08 DIAGNOSIS — A419 Sepsis, unspecified organism: Secondary | ICD-10-CM | POA: Diagnosis not present

## 2019-12-08 DIAGNOSIS — G459 Transient cerebral ischemic attack, unspecified: Secondary | ICD-10-CM | POA: Diagnosis not present

## 2019-12-08 DIAGNOSIS — E1142 Type 2 diabetes mellitus with diabetic polyneuropathy: Secondary | ICD-10-CM | POA: Diagnosis not present

## 2019-12-08 DIAGNOSIS — I440 Atrioventricular block, first degree: Secondary | ICD-10-CM | POA: Diagnosis not present

## 2019-12-08 DIAGNOSIS — R5383 Other fatigue: Secondary | ICD-10-CM | POA: Diagnosis not present

## 2019-12-08 DIAGNOSIS — Z8673 Personal history of transient ischemic attack (TIA), and cerebral infarction without residual deficits: Secondary | ICD-10-CM | POA: Diagnosis not present

## 2019-12-08 DIAGNOSIS — N179 Acute kidney failure, unspecified: Secondary | ICD-10-CM | POA: Diagnosis not present

## 2019-12-08 DIAGNOSIS — L97929 Non-pressure chronic ulcer of unspecified part of left lower leg with unspecified severity: Secondary | ICD-10-CM | POA: Diagnosis not present

## 2019-12-08 DIAGNOSIS — R5381 Other malaise: Secondary | ICD-10-CM | POA: Diagnosis not present

## 2019-12-08 DIAGNOSIS — N3001 Acute cystitis with hematuria: Secondary | ICD-10-CM | POA: Diagnosis not present

## 2019-12-08 DIAGNOSIS — E782 Mixed hyperlipidemia: Secondary | ICD-10-CM | POA: Diagnosis not present

## 2019-12-08 DIAGNOSIS — M542 Cervicalgia: Secondary | ICD-10-CM | POA: Diagnosis not present

## 2019-12-08 DIAGNOSIS — I10 Essential (primary) hypertension: Secondary | ICD-10-CM | POA: Diagnosis not present

## 2019-12-08 DIAGNOSIS — C61 Malignant neoplasm of prostate: Secondary | ICD-10-CM | POA: Diagnosis not present

## 2019-12-08 DIAGNOSIS — E11621 Type 2 diabetes mellitus with foot ulcer: Secondary | ICD-10-CM | POA: Diagnosis not present

## 2019-12-08 DIAGNOSIS — L0390 Cellulitis, unspecified: Secondary | ICD-10-CM | POA: Diagnosis not present

## 2019-12-08 DIAGNOSIS — R531 Weakness: Secondary | ICD-10-CM | POA: Diagnosis not present

## 2019-12-08 DIAGNOSIS — Z87898 Personal history of other specified conditions: Secondary | ICD-10-CM | POA: Diagnosis not present

## 2019-12-08 DIAGNOSIS — L03116 Cellulitis of left lower limb: Secondary | ICD-10-CM | POA: Diagnosis not present

## 2019-12-08 DIAGNOSIS — E1165 Type 2 diabetes mellitus with hyperglycemia: Secondary | ICD-10-CM | POA: Diagnosis not present

## 2019-12-08 DIAGNOSIS — I348 Other nonrheumatic mitral valve disorders: Secondary | ICD-10-CM | POA: Diagnosis not present

## 2019-12-08 DIAGNOSIS — M79605 Pain in left leg: Secondary | ICD-10-CM | POA: Diagnosis not present

## 2019-12-09 DIAGNOSIS — R531 Weakness: Secondary | ICD-10-CM | POA: Diagnosis not present

## 2019-12-09 DIAGNOSIS — I10 Essential (primary) hypertension: Secondary | ICD-10-CM | POA: Diagnosis not present

## 2019-12-09 DIAGNOSIS — M79605 Pain in left leg: Secondary | ICD-10-CM | POA: Diagnosis not present

## 2019-12-09 DIAGNOSIS — E11621 Type 2 diabetes mellitus with foot ulcer: Secondary | ICD-10-CM | POA: Diagnosis not present

## 2019-12-09 DIAGNOSIS — E782 Mixed hyperlipidemia: Secondary | ICD-10-CM | POA: Diagnosis not present

## 2019-12-09 DIAGNOSIS — Z8673 Personal history of transient ischemic attack (TIA), and cerebral infarction without residual deficits: Secondary | ICD-10-CM | POA: Diagnosis not present

## 2019-12-09 DIAGNOSIS — R509 Fever, unspecified: Secondary | ICD-10-CM | POA: Diagnosis not present

## 2019-12-09 DIAGNOSIS — C61 Malignant neoplasm of prostate: Secondary | ICD-10-CM | POA: Diagnosis not present

## 2019-12-09 DIAGNOSIS — Z87898 Personal history of other specified conditions: Secondary | ICD-10-CM | POA: Diagnosis not present

## 2019-12-09 DIAGNOSIS — R5381 Other malaise: Secondary | ICD-10-CM | POA: Diagnosis not present

## 2019-12-09 DIAGNOSIS — M542 Cervicalgia: Secondary | ICD-10-CM | POA: Diagnosis not present

## 2019-12-10 DIAGNOSIS — C61 Malignant neoplasm of prostate: Secondary | ICD-10-CM | POA: Diagnosis not present

## 2019-12-10 DIAGNOSIS — I10 Essential (primary) hypertension: Secondary | ICD-10-CM | POA: Diagnosis not present

## 2019-12-10 DIAGNOSIS — Z87898 Personal history of other specified conditions: Secondary | ICD-10-CM | POA: Diagnosis not present

## 2019-12-10 DIAGNOSIS — I348 Other nonrheumatic mitral valve disorders: Secondary | ICD-10-CM | POA: Diagnosis not present

## 2019-12-10 DIAGNOSIS — M542 Cervicalgia: Secondary | ICD-10-CM | POA: Diagnosis not present

## 2019-12-10 DIAGNOSIS — R509 Fever, unspecified: Secondary | ICD-10-CM | POA: Diagnosis not present

## 2019-12-10 DIAGNOSIS — Z8673 Personal history of transient ischemic attack (TIA), and cerebral infarction without residual deficits: Secondary | ICD-10-CM | POA: Diagnosis not present

## 2019-12-10 DIAGNOSIS — R531 Weakness: Secondary | ICD-10-CM | POA: Diagnosis not present

## 2019-12-10 DIAGNOSIS — E11621 Type 2 diabetes mellitus with foot ulcer: Secondary | ICD-10-CM | POA: Diagnosis not present

## 2019-12-10 DIAGNOSIS — R5381 Other malaise: Secondary | ICD-10-CM | POA: Diagnosis not present

## 2019-12-11 DIAGNOSIS — Z8673 Personal history of transient ischemic attack (TIA), and cerebral infarction without residual deficits: Secondary | ICD-10-CM | POA: Diagnosis not present

## 2019-12-11 DIAGNOSIS — I10 Essential (primary) hypertension: Secondary | ICD-10-CM | POA: Diagnosis not present

## 2019-12-11 DIAGNOSIS — C61 Malignant neoplasm of prostate: Secondary | ICD-10-CM | POA: Diagnosis not present

## 2019-12-11 DIAGNOSIS — E11621 Type 2 diabetes mellitus with foot ulcer: Secondary | ICD-10-CM | POA: Diagnosis not present

## 2019-12-11 DIAGNOSIS — R531 Weakness: Secondary | ICD-10-CM | POA: Diagnosis not present

## 2019-12-11 DIAGNOSIS — M542 Cervicalgia: Secondary | ICD-10-CM | POA: Diagnosis not present

## 2019-12-11 DIAGNOSIS — R509 Fever, unspecified: Secondary | ICD-10-CM | POA: Diagnosis not present

## 2019-12-11 DIAGNOSIS — R5381 Other malaise: Secondary | ICD-10-CM | POA: Diagnosis not present

## 2019-12-11 DIAGNOSIS — Z87898 Personal history of other specified conditions: Secondary | ICD-10-CM | POA: Diagnosis not present

## 2019-12-12 DIAGNOSIS — L0390 Cellulitis, unspecified: Secondary | ICD-10-CM | POA: Diagnosis not present

## 2019-12-12 DIAGNOSIS — Z794 Long term (current) use of insulin: Secondary | ICD-10-CM | POA: Diagnosis not present

## 2019-12-12 DIAGNOSIS — L03116 Cellulitis of left lower limb: Secondary | ICD-10-CM | POA: Diagnosis not present

## 2019-12-12 DIAGNOSIS — C61 Malignant neoplasm of prostate: Secondary | ICD-10-CM | POA: Diagnosis not present

## 2019-12-12 DIAGNOSIS — A419 Sepsis, unspecified organism: Secondary | ICD-10-CM | POA: Diagnosis not present

## 2019-12-12 DIAGNOSIS — R531 Weakness: Secondary | ICD-10-CM | POA: Diagnosis not present

## 2019-12-12 DIAGNOSIS — E1165 Type 2 diabetes mellitus with hyperglycemia: Secondary | ICD-10-CM | POA: Diagnosis not present

## 2019-12-12 DIAGNOSIS — N3001 Acute cystitis with hematuria: Secondary | ICD-10-CM | POA: Diagnosis not present

## 2019-12-13 DIAGNOSIS — A419 Sepsis, unspecified organism: Secondary | ICD-10-CM | POA: Diagnosis not present

## 2019-12-13 DIAGNOSIS — I10 Essential (primary) hypertension: Secondary | ICD-10-CM | POA: Diagnosis not present

## 2019-12-13 DIAGNOSIS — L03116 Cellulitis of left lower limb: Secondary | ICD-10-CM | POA: Diagnosis not present

## 2019-12-13 DIAGNOSIS — R531 Weakness: Secondary | ICD-10-CM | POA: Diagnosis not present

## 2019-12-14 DIAGNOSIS — A419 Sepsis, unspecified organism: Secondary | ICD-10-CM | POA: Diagnosis not present

## 2019-12-14 DIAGNOSIS — I10 Essential (primary) hypertension: Secondary | ICD-10-CM | POA: Diagnosis not present

## 2019-12-14 DIAGNOSIS — L03116 Cellulitis of left lower limb: Secondary | ICD-10-CM | POA: Diagnosis not present

## 2019-12-14 DIAGNOSIS — R531 Weakness: Secondary | ICD-10-CM | POA: Diagnosis not present

## 2019-12-15 DIAGNOSIS — R531 Weakness: Secondary | ICD-10-CM | POA: Diagnosis not present

## 2019-12-15 DIAGNOSIS — I10 Essential (primary) hypertension: Secondary | ICD-10-CM | POA: Diagnosis not present

## 2019-12-15 DIAGNOSIS — A419 Sepsis, unspecified organism: Secondary | ICD-10-CM | POA: Diagnosis not present

## 2019-12-15 DIAGNOSIS — L03116 Cellulitis of left lower limb: Secondary | ICD-10-CM | POA: Diagnosis not present

## 2019-12-16 DIAGNOSIS — L03116 Cellulitis of left lower limb: Secondary | ICD-10-CM | POA: Diagnosis not present

## 2019-12-16 DIAGNOSIS — R531 Weakness: Secondary | ICD-10-CM | POA: Diagnosis not present

## 2019-12-16 DIAGNOSIS — I10 Essential (primary) hypertension: Secondary | ICD-10-CM | POA: Diagnosis not present

## 2019-12-16 DIAGNOSIS — A419 Sepsis, unspecified organism: Secondary | ICD-10-CM | POA: Diagnosis not present

## 2019-12-17 DIAGNOSIS — A419 Sepsis, unspecified organism: Secondary | ICD-10-CM | POA: Diagnosis not present

## 2019-12-17 DIAGNOSIS — R531 Weakness: Secondary | ICD-10-CM | POA: Diagnosis not present

## 2019-12-17 DIAGNOSIS — L03116 Cellulitis of left lower limb: Secondary | ICD-10-CM | POA: Diagnosis not present

## 2019-12-17 DIAGNOSIS — I10 Essential (primary) hypertension: Secondary | ICD-10-CM | POA: Diagnosis not present

## 2019-12-18 DIAGNOSIS — A419 Sepsis, unspecified organism: Secondary | ICD-10-CM | POA: Diagnosis not present

## 2019-12-18 DIAGNOSIS — E1165 Type 2 diabetes mellitus with hyperglycemia: Secondary | ICD-10-CM | POA: Diagnosis not present

## 2019-12-18 DIAGNOSIS — E782 Mixed hyperlipidemia: Secondary | ICD-10-CM | POA: Diagnosis not present

## 2019-12-18 DIAGNOSIS — I10 Essential (primary) hypertension: Secondary | ICD-10-CM | POA: Diagnosis not present

## 2019-12-18 DIAGNOSIS — R509 Fever, unspecified: Secondary | ICD-10-CM | POA: Diagnosis not present

## 2019-12-18 DIAGNOSIS — Z794 Long term (current) use of insulin: Secondary | ICD-10-CM | POA: Diagnosis not present

## 2019-12-18 DIAGNOSIS — C61 Malignant neoplasm of prostate: Secondary | ICD-10-CM | POA: Diagnosis not present

## 2019-12-18 DIAGNOSIS — R531 Weakness: Secondary | ICD-10-CM | POA: Diagnosis not present

## 2019-12-18 DIAGNOSIS — L03116 Cellulitis of left lower limb: Secondary | ICD-10-CM | POA: Diagnosis not present

## 2019-12-19 DIAGNOSIS — E782 Mixed hyperlipidemia: Secondary | ICD-10-CM | POA: Diagnosis not present

## 2019-12-19 DIAGNOSIS — R531 Weakness: Secondary | ICD-10-CM | POA: Diagnosis not present

## 2019-12-19 DIAGNOSIS — C61 Malignant neoplasm of prostate: Secondary | ICD-10-CM | POA: Diagnosis not present

## 2019-12-19 DIAGNOSIS — L03116 Cellulitis of left lower limb: Secondary | ICD-10-CM | POA: Diagnosis not present

## 2019-12-19 DIAGNOSIS — E1165 Type 2 diabetes mellitus with hyperglycemia: Secondary | ICD-10-CM | POA: Diagnosis not present

## 2019-12-19 DIAGNOSIS — A419 Sepsis, unspecified organism: Secondary | ICD-10-CM | POA: Diagnosis not present

## 2019-12-19 DIAGNOSIS — R509 Fever, unspecified: Secondary | ICD-10-CM | POA: Diagnosis not present

## 2019-12-19 DIAGNOSIS — I10 Essential (primary) hypertension: Secondary | ICD-10-CM | POA: Diagnosis not present

## 2019-12-19 DIAGNOSIS — Z794 Long term (current) use of insulin: Secondary | ICD-10-CM | POA: Diagnosis not present

## 2019-12-20 ENCOUNTER — Other Ambulatory Visit: Payer: Self-pay

## 2019-12-20 NOTE — Patient Outreach (Signed)
[facility]) Care Management  12/20/2019  Matthew [name] [birth year] [id number]   Referral Date: 12/20/19 Referral Source: [employer] Report Date of Discharge: 12/19/19 Facility:  [place]: [facility]  Telephone call to patient for transition of care call. Patient reports he is doing good now that he is home.  He states his neighbor Ms. Zigmund Daniel is there helping him and he is waiting for the home health nurse to come. He states he has all his medications.  He declines medication review at this time. Patient states he has appointment with his PCP  Matthew [name] on tomorrow. Denies problems with transportation.  Advised patient that if his condition changes or he feels worse to contact physician or seek emergency treatment. He verbalized understanding.    Plan: RN CM will close case as patient not eligible due to non-[facility] PCP.   Jone Baseman, RN, MSN [place] [facility] Care Management Coordinator Direct Line [phone] Cell [phone] Toll Free: [phone]  Fax: [phone]

## 2020-01-23 LAB — HEPATIC FUNCTION PANEL
ALT: 47 — AB (ref 10–40)
AST: 62 — AB (ref 14–40)
Alkaline Phosphatase: 105 (ref 25–125)
Bilirubin, Total: 0.1

## 2020-01-23 LAB — BASIC METABOLIC PANEL
BUN: 15 (ref 4–21)
CO2: 25 — AB (ref 13–22)
Chloride: 105 (ref 99–108)
Creatinine: 1.1 (ref 0.6–1.3)
Glucose: 173
Potassium: 4.5 (ref 3.4–5.3)
Sodium: 139 (ref 137–147)

## 2020-01-23 LAB — LIPID PANEL
Cholesterol: 131 (ref 0–200)
HDL: 30 — AB (ref 35–70)
LDL Cholesterol: 71
Triglycerides: 149 (ref 40–160)

## 2020-01-23 LAB — COMPREHENSIVE METABOLIC PANEL
Albumin: 3.6 (ref 3.5–5.0)
Calcium: 8.9 (ref 8.7–10.7)

## 2020-05-21 ENCOUNTER — Other Ambulatory Visit: Payer: Self-pay

## 2020-05-21 ENCOUNTER — Encounter: Payer: Self-pay | Admitting: Podiatry

## 2020-05-21 ENCOUNTER — Ambulatory Visit: Payer: Medicare HMO | Admitting: Podiatry

## 2020-05-21 DIAGNOSIS — R609 Edema, unspecified: Secondary | ICD-10-CM

## 2020-05-21 DIAGNOSIS — I739 Peripheral vascular disease, unspecified: Secondary | ICD-10-CM | POA: Diagnosis not present

## 2020-05-21 DIAGNOSIS — I83892 Varicose veins of left lower extremities with other complications: Secondary | ICD-10-CM

## 2020-05-21 DIAGNOSIS — I83029 Varicose veins of left lower extremity with ulcer of unspecified site: Secondary | ICD-10-CM

## 2020-05-21 DIAGNOSIS — E1142 Type 2 diabetes mellitus with diabetic polyneuropathy: Secondary | ICD-10-CM

## 2020-05-21 DIAGNOSIS — L97929 Non-pressure chronic ulcer of unspecified part of left lower leg with unspecified severity: Secondary | ICD-10-CM

## 2020-05-21 NOTE — Progress Notes (Signed)
  Subjective:  Patient ID: Matthew [name], male    DOB: 1945-11-21,  MRN: [id number]  Chief Complaint  Patient presents with  . Diabetes  . Foot Swelling    Patient present with bilateral swollen feet, left worse than right  . Venous Insufficiency  . Diabetic Ulcer    Multiple sites left foot and lower leg    75 y.o. male presents with the above complaint. History confirmed with patient.  States he is have been here for some time, he has been treated at [facility] recently by multiple doctors and podiatrist as well as vascular surgery who put in stents.  Wounds been open and bleeding and draining.  They are very painful.  He has been put into compression dressings and he removed these because he said they felt really tight.  His last A1c was 7.2%.  His blood sugar this morning was 248 Objective:  Physical Exam: Foot is warm and well-perfused he does have weakly palpable pedal pulses, there are multiple granular wound beds on the foot as well as venous ulcerations on the leg of various sizes.  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      Assessment:   1. Type 2 diabetes mellitus with diabetic polyneuropathy, without long-term current use of insulin ([place])   2. Peripheral arterial disease (HCC)   3. Venous stasis ulcer of left lower leg with edema of left lower leg (HCC)      Plan:  Patient was evaluated and treated and all questions answered.  Wounds very painful for him today.  I was unable to debride them.  I discussed them that due to the size and severity and the necessity for multilayer compression dressings and significant debridement and anesthetic in the office for debridements, these are beyond my ability to treat in the office.  I am referring him to the [facility] wound care center.  I suspect they have not healed likely due to noncompliance with therapy regimens.  Multiple etiology including arterial venous and neuropathic diabetic ulcerations  No follow-ups on  file.

## 2020-05-23 ENCOUNTER — Telehealth: Payer: Self-pay | Admitting: Podiatry

## 2020-05-23 NOTE — Telephone Encounter (Signed)
Pt wanted me to let you know that he can't be seen with the wound center for another 3 weeks. He stated they did not have a sense of urgency. Please advise.

## 2020-06-13 ENCOUNTER — Encounter (HOSPITAL_BASED_OUTPATIENT_CLINIC_OR_DEPARTMENT_OTHER): Payer: Medicare HMO | Admitting: Internal Medicine

## 2020-06-18 ENCOUNTER — Telehealth: Payer: Self-pay | Admitting: *Deleted

## 2020-06-18 ENCOUNTER — Ambulatory Visit: Payer: Medicare HMO | Admitting: Family Medicine

## 2020-06-18 NOTE — Telephone Encounter (Signed)
Patient called,said that he had cancelled an appointment 1 week ago but still keeps getting reminders.

## 2020-06-21 ENCOUNTER — Ambulatory Visit: Payer: Medicare HMO | Admitting: Podiatry

## 2020-06-25 ENCOUNTER — Encounter: Payer: Self-pay | Admitting: Family Medicine

## 2020-06-25 ENCOUNTER — Ambulatory Visit (INDEPENDENT_AMBULATORY_CARE_PROVIDER_SITE_OTHER): Payer: Medicare HMO | Admitting: Family Medicine

## 2020-06-25 ENCOUNTER — Other Ambulatory Visit: Payer: Self-pay

## 2020-06-25 VITALS — BP 111/63 | HR 76 | Ht 74.0 in | Wt 225.0 lb

## 2020-06-25 DIAGNOSIS — S91309A Unspecified open wound, unspecified foot, initial encounter: Secondary | ICD-10-CM

## 2020-06-25 DIAGNOSIS — I872 Venous insufficiency (chronic) (peripheral): Secondary | ICD-10-CM

## 2020-06-25 DIAGNOSIS — L97322 Non-pressure chronic ulcer of left ankle with fat layer exposed: Secondary | ICD-10-CM

## 2020-06-25 DIAGNOSIS — R69 Illness, unspecified: Secondary | ICD-10-CM | POA: Insufficient documentation

## 2020-06-25 DIAGNOSIS — L97409 Non-pressure chronic ulcer of unspecified heel and midfoot with unspecified severity: Secondary | ICD-10-CM | POA: Insufficient documentation

## 2020-06-25 MED ORDER — DOXYCYCLINE HYCLATE 100 MG PO TABS: 100.0000 mg | ORAL_TABLET | Freq: Two times a day (BID) | ORAL | 0 refills | Status: DC

## 2020-06-25 NOTE — Patient Instructions (Signed)
Start doxycyline twice per day for 10 days.  Continue dressing changes with non-adherent dressing.  See me again in about 10 days

## 2020-06-26 NOTE — Progress Notes (Signed)
Matthew [name] - 75 y.o. male MRN [id number]  Date of birth: 07-14-1945  Subjective Chief Complaint  Patient presents with  . Establish Care    HPI Matthew [name] is a 75 y.o. male .here today for initial visit to establish care.  He has a history of HTN, T2DM, PAD and history of CVA. He receives care through the [place] as well.   He has been dealing with poorly healing ulcerations of the left foot and leg.  Has not been very compliant with advised treatments.  He has been seeing podiatry as well as wound care.  It was suggested that he try compression to these areas along with his dressing changes but he has not done this.  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  He had debridement of wound back in March.  He has been doing washes at home and covering with silvrstat gel and non-adherent pad.  He does have appt with wound care but isn't sure when this is.    His diabetes has been pretty well controlled but he does have neuropathy.  He does not check blood sugars regularly at home.  He is taking lyrica for management of neuropathic pain.    ROS:  A comprehensive ROS was completed and negative except as noted per HPI  Allergies  Allergen Reactions  . Aggrenox [Aspirin-Dipyridamole Er] Shortness Of Breath  . Amoxicillin-Pot Clavulanate Nausea And Vomiting  . Aspirin-Dipyridamole Er Nausea And Vomiting  . Atorvastatin Nausea And Vomiting  . Duloxetine Nausea And Vomiting  . Niacin Other (See Comments)    Past Medical History:  Diagnosis Date  . Acute upper respiratory infection 02/18/2014  . Allergy   . Arthritis   . Carotid artery disease ([place]) 05/11/2013  . Chicken pox as a child  . Diabetes mellitus without complication ([place])   . Fall    every since his stroke  . GERD (gastroesophageal reflux disease)   . Measles   . Other and unspecified hyperlipidemia 05/11/2013  . Peripheral neuropathy   . Poor dental hygiene 05/11/2013  . Shingles 75 yrs old  . Sleep  apnea    [facility]  . [facility]roke [facility]) 2007  . Vocal cord dysfunction     Past Surgical History:  Procedure Laterality Date  . APPENDECTOMY  2000   ruptured  . CAROTID ENDARTERECTOMY  09/2010  . EYE SURGERY  2010   cataracts bilateral  . IR RADIOLOGIST EVAL & MGMT  06/08/2018    Social History   Socioeconomic History  . Marital status: Divorced    Spouse name: Not on file  . Number of children: Not on file  . Years of education: Not on file  . Highest education level: Not on file  Occupational History  . Occupation: Veteran  Tobacco Use  . Smoking status: Former Smoker    Years: 10.00    Types: Cigars    Start date: 11/27/1997  . Smokeless tobacco: Never Used  Vaping Use  . Vaping Use: Never used  Substance and Sexual Activity  . Alcohol use: Yes    Alcohol/week: 0.0 standard drinks    Comment: occasionally drinks wine   . Drug use: No  . Sexual activity: Yes    Partners: Female    Comment: lives with roommate, minimal carbs  Other Topics Concern  . Not on file  Social History Narrative  . Not on file   Social Determinants of Health  Financial Resource Strain: Not on file  Food Insecurity: Not on file  Transportation Needs: Not on file  Physical Activity: Not on file  Stress: Not on file  Social Connections: Not on file    Family History  Problem Relation Age of Onset  . Cancer Mother        kidney  . Heart attack Father   . Stroke Brother   . Heart attack Brother   . Stroke Brother     Health Maintenance  Topic Date Due  . Pneumococcal Vaccine 63-3 Years old (1 of 4 - PCV13) Never done  . Hepatitis C Screening  Never done  . Zoster Vaccines- Shingrix (1 of 2) Never done  . PNA vac Low Risk Adult (1 of 2 - PCV13) 04/04/2011  . HEMOGLOBIN A1C  01/31/2014  . URINE MICROALBUMIN  08/14/2014  . FOOT EXAM  01/03/2015  . OPHTHALMOLOGY EXAM  04/20/2015  . COLONOSCOPY (Pts 45-60yrs Insurance coverage will need to be confirmed)  01/19/2017   . COVID-19 Vaccine (3 - Pfizer risk 4-dose series) 07/05/2019  . INFLUENZA VACCINE  08/19/2020  . TETANUS/TDAP  10/02/2024  . HPV VACCINES  Aged Out     ----------------------------------------------------------------------------------------------------------------------------------------------------------------------------------------------------------------- Physical Exam BP 111/63 (BP Location: Left Arm, Patient Position: Sitting, Cuff Size: Large)   Pulse 76   Ht 6\' 2"  (1.88 m)   Wt 225 lb (102.1 kg)   SpO2 97%   BMI 28.89 kg/m   Physical Exam Constitutional:      Appearance: Normal appearance.  HENT:     Head: Normocephalic and atraumatic.  Cardiovascular:     Rate and Rhythm: Normal rate and regular rhythm.     Comments: 1+ dp bilaterally  Pulmonary:     Effort: Pulmonary effort is normal.     Breath sounds: Normal breath sounds.  Musculoskeletal:     Cervical back: Neck supple.  Skin:    Comments: Ulcerations noted to L leg and dorsum of L foot.  There is surrounding erythema and some drainage from the L foot ulceration.    Neurological:     General: No focal deficit present.     Mental Status: He is alert.  Psychiatric:        Mood and Affect: Mood normal.        Behavior: Behavior normal.     ------------------------------------------------------------------------------------------------------------------------------------------------------------------------------------------------------------------- Assessment and Plan  No problem-specific Assessment & Plan notes found for this encounter.   Meds ordered this encounter  Medications  . doxycycline (VIBRA-TABS) 100 MG tablet    Sig: Take 1 tablet (100 mg total) by mouth 2 (two) times daily for 10 days.    Dispense:  20 tablet    Refill:  0    Return in about 10 days (around 07/05/2020) for wound follow up.    This visit occurred during the SARS-CoV-2 public health emergency.  Safety protocols were in  place, including screening questions prior to the visit, additional usage of staff PPE, and extensive cleaning of exam room while observing appropriate contact time as indicated for disinfecting solutions.

## 2020-06-26 NOTE — Assessment & Plan Note (Signed)
Wounds improving per patient.  He has upcoming visit with wound care.  I do think unna boot may be helpful to expedite healing but he declines this.   There is some erythema and drainage around foot wound, covering with doxycycline.

## 2020-07-04 ENCOUNTER — Encounter: Payer: Self-pay | Admitting: Family Medicine

## 2020-07-04 LAB — PROTEIN, TOTAL: Total Protein: 7.4 (ref 6.4–8.2)

## 2020-07-05 ENCOUNTER — Other Ambulatory Visit: Payer: Self-pay

## 2020-07-05 ENCOUNTER — Ambulatory Visit (INDEPENDENT_AMBULATORY_CARE_PROVIDER_SITE_OTHER): Payer: Medicare HMO | Admitting: Family Medicine

## 2020-07-05 ENCOUNTER — Encounter: Payer: Self-pay | Admitting: Family Medicine

## 2020-07-05 VITALS — BP 152/74 | HR 69 | Ht 74.0 in | Wt 230.0 lb

## 2020-07-05 DIAGNOSIS — L97522 Non-pressure chronic ulcer of other part of left foot with fat layer exposed: Secondary | ICD-10-CM

## 2020-07-05 DIAGNOSIS — I872 Venous insufficiency (chronic) (peripheral): Secondary | ICD-10-CM

## 2020-07-05 DIAGNOSIS — L97322 Non-pressure chronic ulcer of left ankle with fat layer exposed: Secondary | ICD-10-CM | POA: Diagnosis not present

## 2020-07-05 MED ORDER — SM BANDAGES FABRIC EXTRA LARGE MISC: 3 refills | Status: DC

## 2020-07-05 NOTE — Patient Instructions (Signed)
Continue dressing changes.  Keep let elevated.  Use compression stockings.  I have placed a referral to Dr. Prudy Feeler with podiatry.

## 2020-07-07 NOTE — Progress Notes (Signed)
Matthew [name] - 75 y.o. male MRN [id number]  Date of birth: 12/24/1945  Subjective Chief Complaint  Patient presents with   Wound Check    HPI Matthew [name] is here today for follow up of wound to L leg.  He was started on doxycycline at last visit due to some surrounding cellulitis.  He has been using iodine soaks as well.  He is using compression socks and post-op shoe.  He has declined to return to wound care because of disagreement in care he was receiving.  He does not want to go to [facility] or [facility].  He doesn't have too much pain.    ROS:  A comprehensive ROS was completed and negative except as noted per HPI  Allergies  Allergen Reactions   Aggrenox [Aspirin-Dipyridamole Er] Shortness Of Breath   Amoxicillin-Pot Clavulanate Nausea And Vomiting   Aspirin-Dipyridamole Er Nausea And Vomiting   Atorvastatin Nausea And Vomiting   Duloxetine Nausea And Vomiting   Niacin Other (See Comments)    Past Medical History:  Diagnosis Date   Acute upper respiratory infection 02/18/2014   Allergy    Arthritis    Carotid artery disease ([place]) 05/11/2013   Chicken pox as a child   Diabetes mellitus without complication ([place])    Fall    every since his stroke   GERD (gastroesophageal reflux disease)    Measles    Other and unspecified hyperlipidemia 05/11/2013   Peripheral neuropathy    Poor dental hygiene 05/11/2013   Shingles 75 yrs old   Sleep apnea    [facility]   Stroke [facility]) 2007   Vocal cord dysfunction     Past Surgical History:  Procedure Laterality Date   APPENDECTOMY  2000   ruptured   CAROTID ENDARTERECTOMY  09/2010   EYE SURGERY  2010   cataracts bilateral   IR RADIOLOGIST EVAL & MGMT  06/08/2018    Social History   Socioeconomic History   Marital status: Divorced    Spouse name: Not on file   Number of children: Not on file   Years of education: Not on file   Highest education level: Not on file  Occupational History    Occupation: Veteran  Tobacco Use   Smoking status: Former    Pack years: 0.00    Types: Cigars    Start date: 11/27/1997   Smokeless tobacco: Never  Vaping Use   Vaping Use: Never used  Substance and Sexual Activity   Alcohol use: Yes    Alcohol/week: 0.0 standard drinks    Comment: occasionally drinks wine    Drug use: No   Sexual activity: Yes    Partners: Female    Comment: lives with roommate, minimal carbs  Other Topics Concern   Not on file  Social History Narrative   Not on file   Social Determinants of Health   Financial Resource Strain: Not on file  Food Insecurity: Not on file  Transportation Needs: Not on file  Physical Activity: Not on file  Stress: Not on file  Social Connections: Not on file    Family History  Problem Relation Age of Onset   Cancer Mother        kidney   Heart attack Father    Stroke Brother    Heart attack Brother    Stroke Brother     Health Maintenance  Topic Date Due   Hepatitis C Screening  Never done  Zoster Vaccines- Shingrix (1 of 2) Never done   PNA vac Low Risk Adult (1 of 2 - PCV13) 04/04/2011   HEMOGLOBIN A1C  01/31/2014   URINE MICROALBUMIN  08/14/2014   FOOT EXAM  01/03/2015   OPHTHALMOLOGY EXAM  04/20/2015   COLONOSCOPY (Pts 45-67yrs Insurance coverage will need to be confirmed)  01/19/2017   COVID-19 Vaccine (3 - Pfizer risk series) 07/05/2019   INFLUENZA VACCINE  08/19/2020   TETANUS/TDAP  10/02/2024   HPV VACCINES  Aged Out     ----------------------------------------------------------------------------------------------------------------------------------------------------------------------------------------------------------------- Physical Exam BP (!) 152/74 (BP Location: Left Arm, Patient Position: Sitting, Cuff Size: Large)   Pulse 69   Ht 6\' 2"  (1.88 m)   Wt 230 lb (104.3 kg)   SpO2 97%   BMI 29.53 kg/m   Physical Exam Constitutional:      Appearance: Normal appearance.  HENT:     Head:  Normocephalic and atraumatic.  Cardiovascular:     Rate and Rhythm: Normal rate and regular rhythm.  Musculoskeletal:     Cervical back: Neck supple.  Skin:    Comments: Ulcerations to L foot and leg.  There is yellow slough inside of foot wound.  There is some mild erythema surrounding wound on L foot.   Neurological:     Mental Status: He is alert.  Psychiatric:        Mood and Affect: Mood normal.        Behavior: Behavior normal.    ------------------------------------------------------------------------------------------------------------------------------------------------------------------------------------------------------------------- Assessment and Plan  Venous stasis ulcer of left ankle (HCC) Declines unna boot or other compression wraps.  He refuses wound care but would agree to see podiatry, referral entered.   Complete doxycycline.  Discussed current compression stockings were likely not adequate for the pressure he needs but she states he can not stand anything tighter.     Meds ordered this encounter  Medications   Adhesive Bandages (SM BANDAGES FABRIC) MISC    Sig: Apply to wounds as needed.  1 3/4"x4" bandage [employer] by [employer]    Dispense:  70 each    Refill:  3    No follow-ups on file.    This visit occurred during the SARS-CoV-2 public health emergency.  Safety protocols were in place, including screening questions prior to the visit, additional usage of staff PPE, and extensive cleaning of exam room while observing appropriate contact time as indicated for disinfecting solutions.

## 2020-07-07 NOTE — Assessment & Plan Note (Signed)
Declines unna boot or other compression wraps.  He refuses wound care but would agree to see podiatry, referral entered.   Complete doxycycline.  Discussed current compression stockings were likely not adequate for the pressure he needs but she states he can not stand anything tighter.

## 2020-07-12 ENCOUNTER — Ambulatory Visit: Payer: Medicare HMO | Admitting: Podiatry

## 2020-07-15 IMAGING — CT CT ANGIOGRAPHY AOBIFEM WITHOUT AND WITH CONTRAST
2 of 12 series · 11 of 46 positions shown, 14 images · IV contrast (APPLIED)
Comparison: Right lower extremity x-ray 04/18/2018

CLINICAL DATA: 73-year-old male with a history of lawnmower injury
to the right foot which has not healed completely. Evaluate for
underlying arterial insufficiency.

EXAM:
CT ANGIOGRAPHY OF ABDOMINAL AORTA WITH ILIOFEMORAL RUNOFF
TECHNIQUE: Multidetector CT imaging of the abdomen, pelvis and lower
extremities was performed using the standard protocol during bolus
administration of intravenous contrast. Multiplanar CT image
reconstructions and MIPs were obtained to evaluate the vascular
anatomy.
CONTRAST:  100mL 60BYIS-T1Z IOPAMIDOL (60BYIS-T1Z) INJECTION 76%

[Series 5: axial arterial · axial · arterial · 0.98mm/px · z∈[+540,+1338]mm · 10 of 324 slices shown, 13 images]
[im 39/324  soft-tissue]
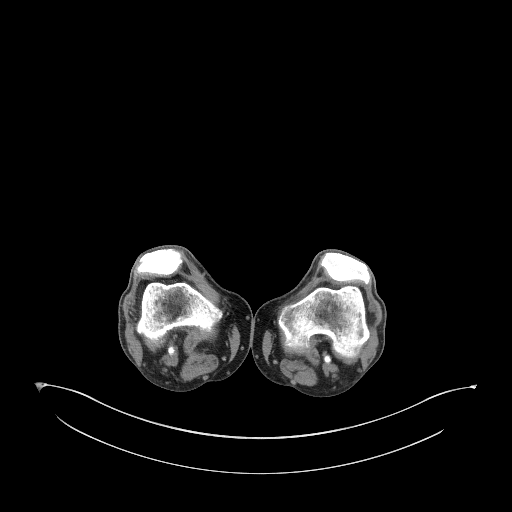
[im 39/324  bone]
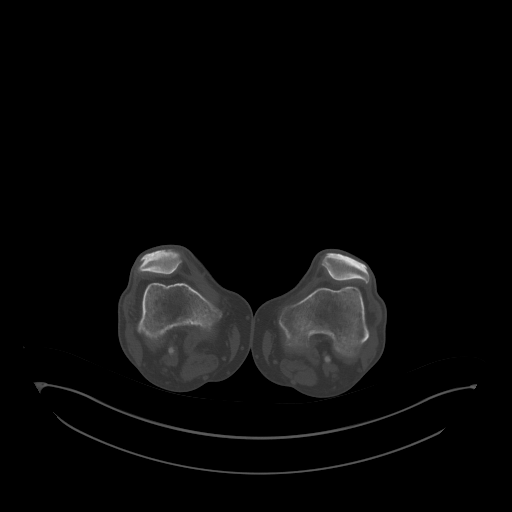
[im 77/324  soft-tissue]
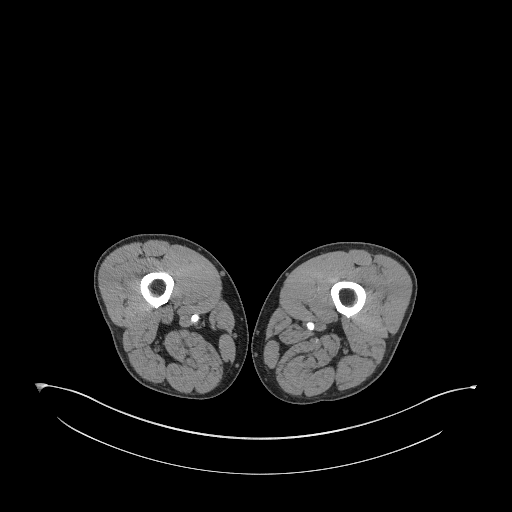
[im 115/324  soft-tissue]
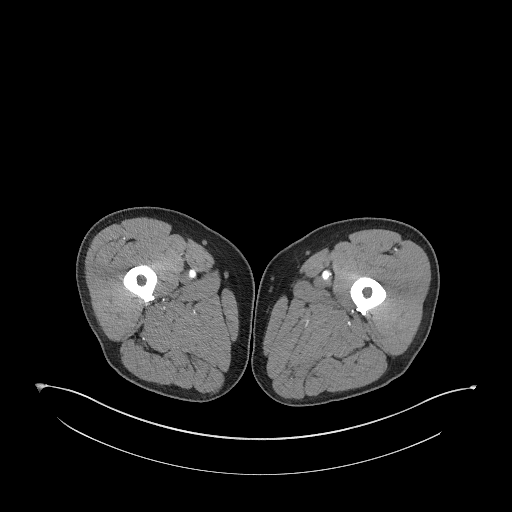
[im 153/324  soft-tissue]
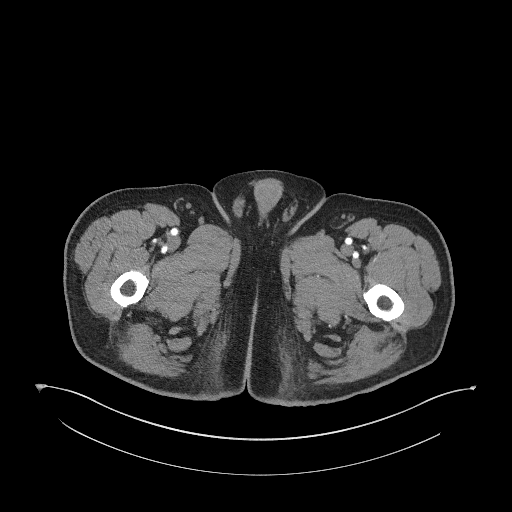
[im 191/324  soft-tissue]
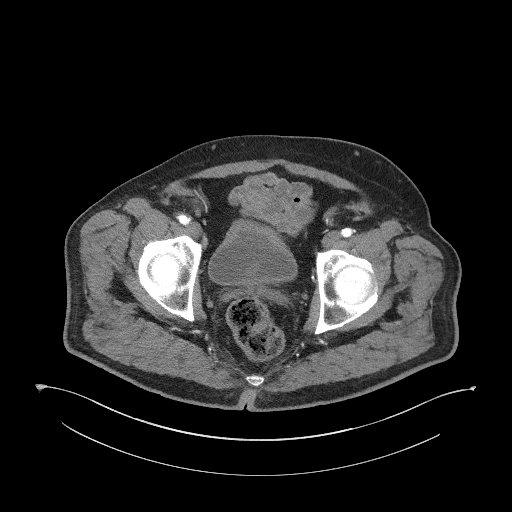
[im 229/324  soft-tissue]
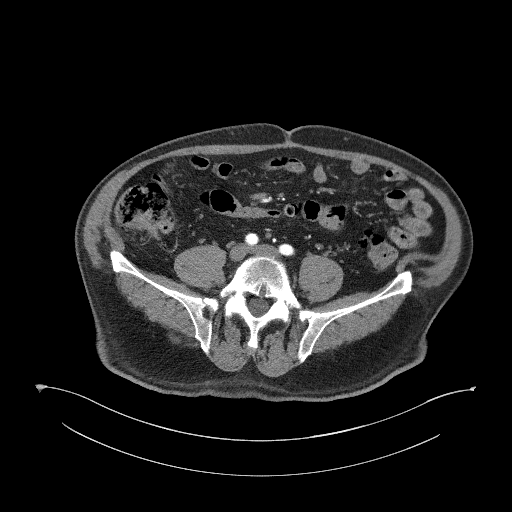
[im 248/324  lung]
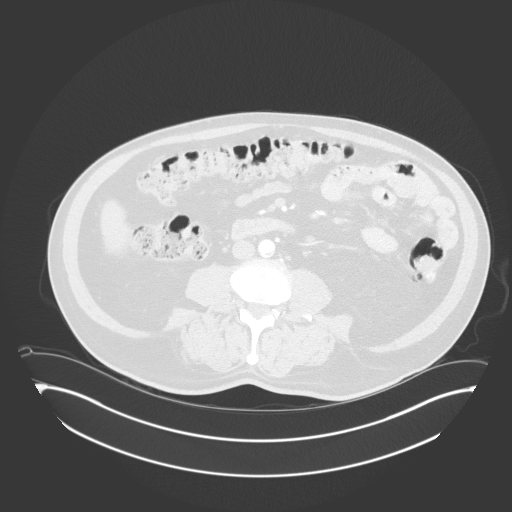
[im 267/324  soft-tissue]
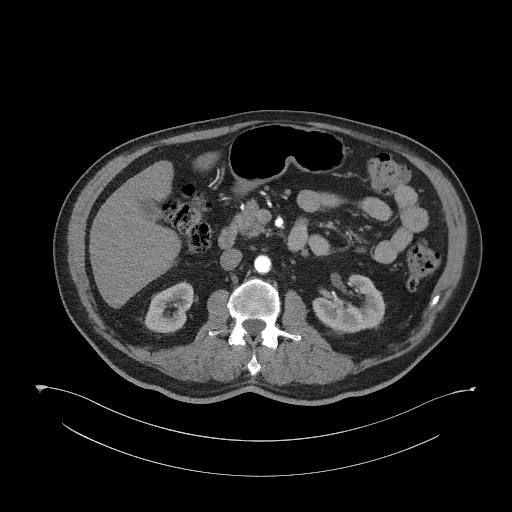
[im 267/324  lung]
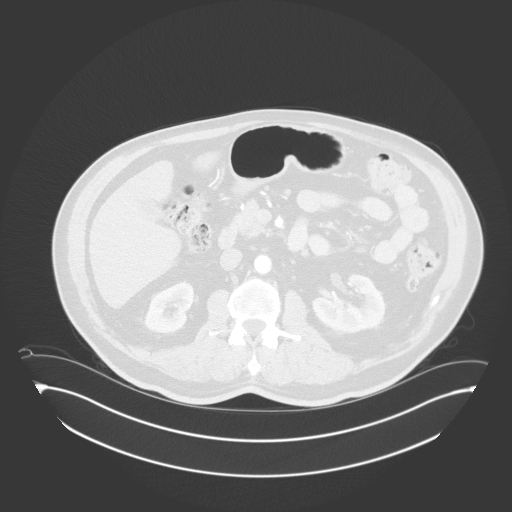
[im 286/324  lung]
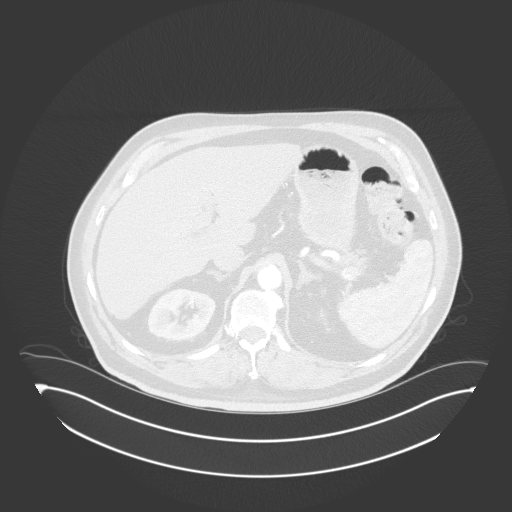
[im 305/324  soft-tissue]
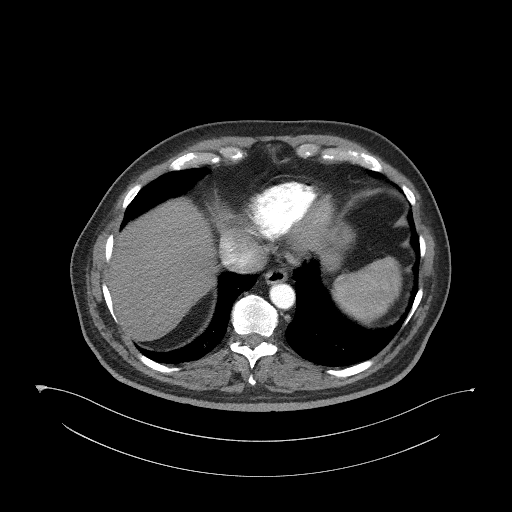
[im 305/324  lung]
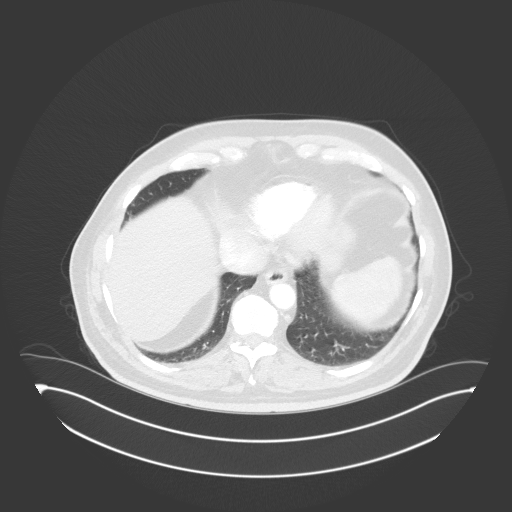

[Series 6: coronal upper · coronal · 0.97mm/px · 1 of 160 slices shown]
[im 80/160  soft-tissue]
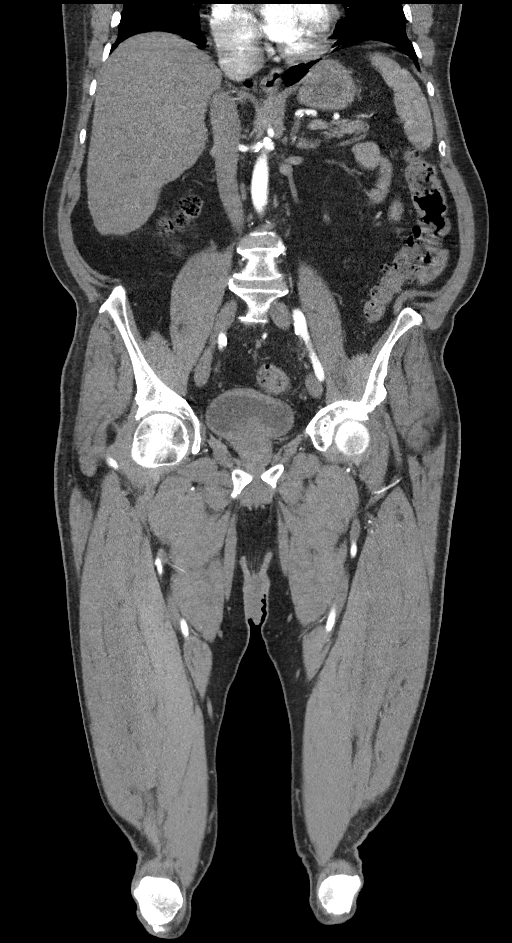

[11 of 46 positions shown; findings below may reference images not displayed]

FINDINGS: VASCULAR

Aorta: Mild heterogeneous atherosclerotic plaque. No evidence of
aneurysm or dissection.

Celiac: Patent without evidence of aneurysm, dissection, vasculitis
or significant stenosis.

SMA: Patent without evidence of aneurysm, dissection, vasculitis or
significant stenosis.

Renals: Solitary renal arteries. Heterogeneous atherosclerotic
plaque results in a moderate focal stenosis of the proximal right
renal artery. The left renal artery is largely disease free. No
evidence of dissection, aneurysm or fibromuscular dysplasia.

IMA: Patent without evidence of aneurysm, dissection, vasculitis or
significant stenosis.

RIGHT Lower Extremity

Inflow: Common, internal and external iliac arteries are patent
without evidence of aneurysm, dissection, vasculitis or significant
stenosis.

Outflow: Common, superficial and profunda femoral arteries and the
popliteal artery are patent without evidence of aneurysm,
dissection, vasculitis or significant stenosis. Trace
atherosclerotic plaque along the medial wall of the common femoral
artery without evidence of stenosis.

Runoff: Limited evaluation of the runoff arteries. The anterior
tibial artery occludes in the upper calf. The posterior tibial
artery occludes in the distal calf. The peroneal artery appears to
be patent to the ankle.

LEFT Lower Extremity

Inflow: Common, internal and external iliac arteries are patent
without evidence of aneurysm, dissection, vasculitis or significant
stenosis.

Outflow: Common, superficial and profunda femoral arteries and the
popliteal artery are patent without evidence of aneurysm,
dissection, vasculitis or significant stenosis. Trace calcified
atherosclerotic plaque along the medial wall of the common femoral
artery without evidence of stenosis.

Runoff: Unfortunately, evaluation of the runoff arteries is
suboptimal. The vessels are poorly opacified. There appears to be
atherosclerotic plaque resulting in a significant stenosis at the
origin of the anterior tibial artery. The anterior tibial artery
then occludes in the upper calf. The posterior tibial and peroneal
arteries appear to be patent to the ankle.

Veins: No focal venous abnormality.

Review of the MIP images confirms the above findings.

NON-VASCULAR

Lower chest: No acute abnormality.

Hepatobiliary: No focal liver abnormality is seen. No gallstones,
gallbladder wall thickening, or biliary dilatation.

Pancreas: Unremarkable. No pancreatic ductal dilatation or
surrounding inflammatory changes.

Spleen: Normal in size without focal abnormality.

Adrenals/Urinary Tract: Adrenal glands are unremarkable. Kidneys are
normal, without renal calculi, focal lesion, or hydronephrosis. The
bladder is under distended but mildly thick walled.

Stomach/Bowel: There are a few scattered diverticula along the
descending colon but no evidence of active diverticulitis. No focal
bowel wall thickening or evidence of obstruction. Surgical changes
of prior appendectomy. The terminal ileum is unremarkable.

Lymphatic: No suspicious lymphadenopathy.

Reproductive: Mild prostatomegaly.

Other: No abdominal wall hernia or abnormality. No abdominopelvic
ascites.

Musculoskeletal: No acute fracture or aggressive appearing lytic or
blastic osseous lesion.
IMPRESSION: VASCULAR

1. Bilateral lower extremity runoff (below the knee) peripheral
arterial disease. On the right, there appears to be occlusion of the
anterior tibial artery with patent 2 vessel runoff to the ankle. On
the left, the anterior and posterior tibial artery both occlude
leaving a 1 vessel runoff to the ankle from the peroneal artery.
Consider referral to Interventional Radiology or other endovascular
specialist for possible catheter directed angiography and
intervention in the setting of a poorly healing right foot wound.
2. No evidence of hemodynamically significant peripheral arterial
disease in the inflow (aortoiliac) or outflow (femoral popliteal)
distributions.
3. Moderate stenosis of the proximal right renal artery.
4. Mild atherosclerotic plaque throughout the abdominal aorta
without evidence of dissection, aneurysm or penetrating ulcer.
Aortic Atherosclerosis ([id number]-170.0).

NON-VASCULAR

1. No acute abnormality within the abdomen or pelvis.
2. Colonic diverticular disease without CT evidence of active
inflammation.
3. Mild prostatomegaly.

## 2020-08-15 ENCOUNTER — Emergency Department (INDEPENDENT_AMBULATORY_CARE_PROVIDER_SITE_OTHER)
Admission: EM | Admit: 2020-08-15 | Discharge: 2020-08-15 | Disposition: A | Discharge status: Home / Self Care | Payer: Medicare HMO | Source: Home / Self Care | Attending: Family Medicine | Admitting: Family Medicine

## 2020-08-15 ENCOUNTER — Other Ambulatory Visit: Payer: Self-pay

## 2020-08-15 ENCOUNTER — Emergency Department (HOSPITAL_COMMUNITY): Payer: Medicare HMO

## 2020-08-15 ENCOUNTER — Emergency Department: Payer: Medicare HMO

## 2020-08-15 ENCOUNTER — Emergency Department (HOSPITAL_COMMUNITY)
Admission: EM | Admit: 2020-08-15 | Discharge: 2020-08-15 | Disposition: A | Discharge status: Home / Self Care | Payer: Medicare HMO | Attending: Emergency Medicine | Admitting: Emergency Medicine

## 2020-08-15 ENCOUNTER — Encounter (HOSPITAL_COMMUNITY): Payer: Self-pay | Admitting: Emergency Medicine

## 2020-08-15 DIAGNOSIS — M549 Dorsalgia, unspecified: Secondary | ICD-10-CM

## 2020-08-15 DIAGNOSIS — Z87891 Personal history of nicotine dependence: Secondary | ICD-10-CM | POA: Diagnosis not present

## 2020-08-15 DIAGNOSIS — I2510 Atherosclerotic heart disease of native coronary artery without angina pectoris: Secondary | ICD-10-CM | POA: Diagnosis not present

## 2020-08-15 DIAGNOSIS — I10 Essential (primary) hypertension: Secondary | ICD-10-CM | POA: Diagnosis not present

## 2020-08-15 DIAGNOSIS — R0789 Other chest pain: Secondary | ICD-10-CM | POA: Diagnosis present

## 2020-08-15 DIAGNOSIS — E1142 Type 2 diabetes mellitus with diabetic polyneuropathy: Secondary | ICD-10-CM | POA: Diagnosis not present

## 2020-08-15 DIAGNOSIS — K219 Gastro-esophageal reflux disease without esophagitis: Secondary | ICD-10-CM

## 2020-08-15 DIAGNOSIS — R079 Chest pain, unspecified: Secondary | ICD-10-CM | POA: Diagnosis not present

## 2020-08-15 DIAGNOSIS — Z8546 Personal history of malignant neoplasm of prostate: Secondary | ICD-10-CM | POA: Diagnosis not present

## 2020-08-15 DIAGNOSIS — Z7982 Long term (current) use of aspirin: Secondary | ICD-10-CM | POA: Diagnosis not present

## 2020-08-15 DIAGNOSIS — Z7984 Long term (current) use of oral hypoglycemic drugs: Secondary | ICD-10-CM | POA: Insufficient documentation

## 2020-08-15 DIAGNOSIS — R9431 Abnormal electrocardiogram [ECG] [EKG]: Secondary | ICD-10-CM | POA: Diagnosis not present

## 2020-08-15 LAB — TROPONIN I (HIGH SENSITIVITY)
Troponin I (High Sensitivity): 12 ng/L (ref <18)
Troponin I (High Sensitivity): 13 ng/L (ref <18)

## 2020-08-15 LAB — COMPREHENSIVE METABOLIC PANEL
ALT: 16 U/L (ref 0–44)
AST: 25 U/L (ref 15–41)
Albumin: 4 g/dL (ref 3.5–5.0)
Alkaline Phosphatase: 59 U/L (ref 38–126)
Anion gap: 10 (ref 5–15)
BUN: 15 mg/dL (ref 8–23)
CO2: 25 mmol/L (ref 22–32)
Calcium: 9.3 mg/dL (ref 8.9–10.3)
Chloride: 101 mmol/L (ref 98–111)
Creatinine, Ser: 1.23 mg/dL (ref 0.61–1.24)
GFR, Estimated: >60 mL/min (ref >60)
Glucose, Bld: 207 mg/dL — ABNORMAL HIGH (ref 70–99)
Potassium: 4.9 mmol/L (ref 3.5–5.1)
Sodium: 136 mmol/L (ref 135–145)
Total Bilirubin: 1.6 mg/dL — ABNORMAL HIGH (ref 0.3–1.2)
Total Protein: 7.8 g/dL (ref 6.5–8.1)

## 2020-08-15 LAB — CBC
HCT: 42.4 % (ref 39.0–52.0)
Hemoglobin: 13.7 g/dL (ref 13.0–17.0)
MCH: 31.2 pg (ref 26.0–34.0)
MCHC: 32.3 g/dL (ref 30.0–36.0)
MCV: 96.6 fL (ref 80.0–100.0)
Platelets: 311 10*3/uL (ref 150–400)
RBC: 4.39 MIL/uL (ref 4.22–5.81)
RDW: 14 % (ref 11.5–15.5)
WBC: 10.4 10*3/uL (ref 4.0–10.5)
nRBC: 0 % (ref 0.0–0.2)

## 2020-08-15 LAB — LIPASE, BLOOD: Lipase: 62 U/L — ABNORMAL HIGH (ref 11–51)

## 2020-08-15 LAB — POCT FASTING CBG KUC MANUAL ENTRY: POCT Glucose (KUC): 211 mg/dL — AB (ref 70–99)

## 2020-08-15 LAB — CBG MONITORING, ED: Glucose-Capillary: 196 mg/dL — ABNORMAL HIGH (ref 70–99)

## 2020-08-15 MED ORDER — ONDANSETRON 4 MG PO TBDP
4.0000 mg | ORAL_TABLET | Freq: Once | ORAL | Status: AC
  Administered 2020-08-15: 4 mg via ORAL

## 2020-08-15 MED ORDER — NITROGLYCERIN 0.4 MG SL SUBL: 0.4000 mg | SUBLINGUAL_TABLET | SUBLINGUAL | Status: DC | PRN

## 2020-08-15 MED ORDER — ONDANSETRON 8 MG PO TBDP: 8.0000 mg | ORAL_TABLET | Freq: Three times a day (TID) | ORAL | 0 refills | Status: DC | PRN

## 2020-08-15 MED ORDER — LIDOCAINE VISCOUS HCL 2 % MT SOLN
15.0000 mL | Freq: Once | OROMUCOSAL | Status: AC
  Administered 2020-08-15: 15 mL via OROMUCOSAL

## 2020-08-15 MED ORDER — IOHEXOL 350 MG/ML SOLN
100.0000 mL | Freq: Once | INTRAVENOUS | Status: AC | PRN
  Administered 2020-08-15: 100 mL via INTRAVENOUS

## 2020-08-15 MED ORDER — ASPIRIN 81 MG PO CHEW: 324.0000 mg | CHEWABLE_TABLET | Freq: Once | ORAL | Status: DC

## 2020-08-15 MED ORDER — SODIUM CHLORIDE 0.9 % IV SOLN: INTRAVENOUS | Status: DC

## 2020-08-15 MED ORDER — ALUM & MAG HYDROXIDE-SIMETH 200-200-20 MG/5ML PO SUSP
15.0000 mL | Freq: Once | ORAL | Status: AC
  Administered 2020-08-15: 15 mL via ORAL

## 2020-08-15 MED ORDER — MORPHINE SULFATE (PF) 4 MG/ML IV SOLN
4.0000 mg | Freq: Once | INTRAVENOUS | Status: AC
Start: 2020-08-15 — End: 2020-08-15
  Administered 2020-08-15: 4 mg via INTRAVENOUS
  Filled 2020-08-15: qty 1

## 2020-08-15 MED ORDER — COLCHICINE 0.6 MG PO TABS: 0.6000 mg | ORAL_TABLET | Freq: Two times a day (BID) | ORAL | 0 refills | Status: DC

## 2020-08-15 MED ORDER — PANTOPRAZOLE SODIUM 20 MG PO TBEC: 20.0000 mg | DELAYED_RELEASE_TABLET | Freq: Two times a day (BID) | ORAL | 0 refills | Status: DC

## 2020-08-15 NOTE — ED Triage Notes (Signed)
PT BIB [facility] from [facility]. Initial complaint of indigestion and shoulder pain. Given 324 aspirin and GI cocktail at [facility]. Pt received 2 nitro via EMS, chest discomfort gone after nitro.

## 2020-08-15 NOTE — Discharge Instructions (Signed)
To ER

## 2020-08-15 NOTE — ED Provider Notes (Signed)
[name]    CSN: [id number] Arrival date & time: 08/15/20  [id number]      History   Chief Complaint Chief Complaint  Patient presents with   Chest Pain   Back Pain    HPI SHEPHARD GELMAN is a 75 y.o. male.   HPI  75 year old gentleman who is here for back pain.  He complained initially had pain in his chest that radiated through to his back, but when I enter the room during triage she complains that the pain is only in his back.  Also complains of ingestion, a feeling of bloating and constipation.  A feeling of "gas".  He states all the symptoms started shortly after he had a meal at [facility] yesterday.  He states symptoms started around 2.  He thought it was a stomachache so went to bed.  Today he awoke and the pain was worse.  It is constant.  It is in the center of his back, vaguely described, between the shoulder blades.  No worse with deep breath.  Patient has had no exertion.  No history of heart disease.  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  He is an insulin-dependent diabetic.  States is usually well controlled.  States he does not have hypertension and is not on a statin.  States he gets his medical care through the [facility] hospital in [place] and from [place] primary care, Dr. Luetta Nutting. In the room he was initially given a GI cocktail.  This did not relieve his pain.  He was then given a Zofran since he felt nausea.  Chest x-ray and EKG were ordered.  His symptoms EKG was received he did have changes consistent with a STEMI.  Patient was given aspirin, and oxygen.  IV started.  EMS called.  Past Medical History:  Diagnosis Date   Acute upper respiratory infection 02/18/2014   Allergy    Arthritis    Carotid artery disease ([place]) 05/11/2013   Chicken pox as a child   Diabetes mellitus without complication ([place])    Fall    every since his stroke   GERD (gastroesophageal reflux disease)    Measles    Other and unspecified  hyperlipidemia 05/11/2013   Peripheral neuropathy    Poor dental hygiene 05/11/2013   Shingles 75 yrs old   Sleep apnea    uses CPAP, managed by [facility]   Stroke [facility]) 2007   Vocal cord dysfunction     Patient Active Problem List   Diagnosis Date Noted   Ulcer of heel ([place]) 06/25/2020   Other ill-defined and unknown causes of morbidity and mortality 06/25/2020   Venous stasis ulcer of left ankle ([place]) 01/04/2019   Leg length discrepancy 01/25/2018   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[place]) 12/28/2017   Wound swab culture positive 12/28/2017   Primary osteoarthritis of right hand 12/14/2017   Diabetic ulcer of right foot ([place]) 12/14/2017   Cervical pain 09/21/2017   Cervical radiculopathy 09/21/2017   Myofascial pain syndrome, cervical 09/21/2017   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   DM type 2, not at goal [facility]) 09/28/2016   Status post CVA 09/28/2016   Prostate cancer ([place]) 09/28/2016   H/O carotid endarterectomy 11/03/2014   Other diseases of vocal cords 11/03/2014   Contact with powered lawnmower as cause of accidental injury at home as place of occurrence 10/03/2014   Essential hypertension with goal blood pressure less than 140/90  10/03/2014   Type 2 diabetes mellitus with diabetic polyneuropathy ([place]) 10/03/2014   Cough 02/28/2014   Acute pharyngitis 02/28/2014   Frequent urination 02/28/2014   Balanitis 02/28/2014   Fatigue 02/28/2014   Acute upper respiratory infection 02/18/2014   Hyperlipidemia, mixed 05/11/2013   Poor dental hygiene 05/11/2013   Carotid artery disease ([place]) 05/11/2013   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 Authorizing Provider  Adhesive Bandages (SM BANDAGES FABRIC) MISC Apply to wounds as needed.  1 3/4"x4" bandage [employer] by [facility] 07/05/20   Luetta Nutting, DO  aspirin 81 MG tablet Take 81 mg by mouth daily.    [provider]  metFORMIN (GLUCOPHAGE) 1000 MG tablet Take by mouth.    [provider]  pregabalin (LYRICA) 100 MG capsule Take 100 mg by mouth 2 (two) times daily.    [provider]  Silver (SILVRSTAT WOUND DRESSING) GEL Apply 1 application topically daily. 05/06/18   Silverio Decamp, MD    Family History Family History  Problem Relation Age of Onset   Cancer Mother        kidney   Heart attack Father    Stroke Brother    Heart attack Brother    Stroke Brother     Social History Social History   Tobacco Use   Smoking status: Former    Types: Cigars    Start date: 11/27/1997   Smokeless tobacco: Never  Vaping Use   Vaping Use: Never used  Substance Use Topics   Alcohol use: Yes    Alcohol/week: 0.0 standard drinks    Comment: occasionally drinks wine    Drug use: No     Allergies   Aggrenox [aspirin-dipyridamole er]   Review of Systems Review of Systems See HPI  Physical Exam Triage Vital Signs ED Triage Vitals  Enc Vitals Group     BP 08/15/20 0946 134/66     Pulse Rate 08/15/20 0946 80     Resp 08/15/20 0946 17     Temp --      Temp Source 08/15/20 0946 Oral     SpO2 08/15/20 0946 99 %     Weight --      Height --      Head Circumference --      Peak Flow --      Pain Score 08/15/20 0947 7     Pain Loc --      Pain Edu? --      Excl. in [place]? --    No data found.  Updated Vital Signs BP 134/66 (BP Location: Right Arm)   Pulse 80   Resp 17   SpO2 99%       Physical Exam Constitutional:      General: He is not in acute distress.  Appearance: He is well-developed. He is ill-appearing.     Comments: Acutely uncomfortable  HENT:     Head: Normocephalic and atraumatic.     Mouth/Throat:     Comments: Patient is wearing mask Eyes:     Conjunctiva/sclera: Conjunctivae normal.     Pupils: Pupils are equal, round, and reactive to light.  Cardiovascular:     Rate and Rhythm: Normal rate and regular rhythm.     Heart sounds: Normal heart sounds.  Pulmonary:     Effort: Pulmonary effort is normal. No respiratory distress.     Breath sounds: Normal breath sounds. No wheezing or rales.  Chest:     Chest wall: No tenderness.  Abdominal:     General: There is no distension.     Palpations: Abdomen is soft.     Tenderness: There is no abdominal tenderness.     Comments: Denies abdominal tenderness.  No epigastric tenderness.  Musculoskeletal:        General: Normal range of motion.     Cervical back: Normal range of motion and neck supple.     Right lower leg: Edema present.     Left lower leg: Edema present.     Comments: Mild edema both ankles.  Left ankle is wrapped due to diabetic ulcers and skin breakdown  Skin:    General: Skin is warm and dry.  Neurological:     General: No focal deficit present.     Mental Status: He is alert.  Psychiatric:        Mood and Affect: Mood normal.     UC Treatments / Results  Labs (all labs ordered are listed, but only abnormal results are displayed) Labs Reviewed  POCT FASTING CBG [facility] MANUAL ENTRY - Abnormal; Notable for the following components:      Result Value   POCT Glucose ([facility]) 211 (*)    All other components within normal limits    EKG   Radiology No results found.  Procedures Procedures (including critical care time)  Medications Ordered in UC Medications  ondansetron (ZOFRAN-ODT) disintegrating tablet 4 mg (4 mg Oral Given 08/15/20 1006)  alum & mag hydroxide-simeth  (MAALOX/MYLANTA) 200-200-20 MG/5ML suspension 15 mL (15 mLs Oral Given 08/15/20 0948)  lidocaine (XYLOCAINE) 2 % viscous mouth solution 15 mL (15 mLs Mouth/Throat Given 08/15/20 0948)    Initial Impression / Assessment and Plan / UC Course  I have reviewed the triage vital signs and the nursing notes.  Pertinent labs & imaging results that were available during my care of the patient were reviewed by me and considered in my medical decision making (see chart for details).     EKG had normal rate and sinus rhythm.  He had ST elevation consistent with her inferior leads of 2 to 3 mm.  No ectopy.  Normal intervals. Final Clinical Impressions(s) / UC Diagnoses   Final diagnoses:  None   Discharge Instructions   None    ED Prescriptions   None    PDMP not reviewed this encounter.   Raylene Everts, MD 08/15/20 1052

## 2020-08-15 NOTE — ED Triage Notes (Signed)
Pt c/o chest pain and back pain since yesterday after lunch. Says he ate at [employer].

## 2020-08-15 NOTE — ED Notes (Signed)
Patient discharge instructions reviewed with the patient. The patient verbalized understanding. Patient discharged.

## 2020-08-15 NOTE — Discharge Instructions (Addendum)
Take the medications as prescribed.  Return to the ER for trouble with chest pain shortness of breath or other concerning symptoms.  Follow-up with your doctor to be rechecked to make sure the symptoms improve

## 2020-08-15 NOTE — ED Notes (Signed)
Patient is being discharged from the Urgent Care and sent to the Emergency Department via EMS. Per Dr Meda Coffee, patient is in need of higher level of care due to abnormal EKG/possible STEMI. Patient is aware and verbalizes understanding of plan of care.  Vitals:   08/15/20 0946  BP: 134/66  Pulse: 80  Resp: 17  SpO2: 99%

## 2020-08-15 NOTE — ED Provider Notes (Signed)
[facility] EMERGENCY DEPARTMENT Provider Note   CSN: [id number] Arrival date & time: 08/15/20  1117     History Chief Complaint  Patient presents with   Chest Pain   Back Pain    Matthew [name] is a 75 y.o. male.   Chest Pain Associated symptoms: back pain   Back Pain Associated symptoms: chest pain    HPI: A 75 year old patient with a history of peripheral artery disease, treated diabetes and hypertension presents for evaluation of chest pain. Initial onset of pain was more than 6 hours ago. The patient's chest pain is described as heaviness/pressure/tightness, is not worse with exertion and is relieved by nitroglycerin. The patient complains of nausea. The patient's chest pain is not middle- or left-sided, is not well-localized, is not sharp and does not radiate to the arms/jaw/neck. The patient denies diaphoresis. The patient has no history of stroke, has not smoked in the past 90 days, has no relevant family history of coronary artery disease (first degree relative at less than age 38), has no history of hypercholesterolemia and does not have an elevated BMI (>=30).  Patient states he started having symptoms yesterday after eating a meal at [facility].  Patient has been having pain primarily in his upper back.  He is also had some nausea.  He states he did have a bowel movement today is not typical for him.  He went to an urgent care.  They noted some EKG changes and he was sent to the ED for further evaluation.  Patient denies having any pain in the front of his chest.  He continues to have some discomfort in his upper back Past Medical History:  Diagnosis Date   Acute upper respiratory infection 02/18/2014   Allergy    Arthritis    Carotid artery disease ([place]) 05/11/2013   Chicken pox as a child   Diabetes mellitus without complication ([place])    Fall    every since his stroke   GERD (gastroesophageal reflux disease)    Measles    Other and unspecified  hyperlipidemia 05/11/2013   Peripheral neuropathy    Poor dental hygiene 05/11/2013   Shingles 75 yrs old   Sleep apnea    uses CPAP, managed by [facility]   Stroke [facility]) 2007   Vocal cord dysfunction     Patient Active Problem List   Diagnosis Date Noted   Ulcer of heel ([place]) 06/25/2020   Other ill-defined and unknown causes of morbidity and mortality 06/25/2020   Venous stasis ulcer of left ankle ([place]) 01/04/2019   Leg length discrepancy 01/25/2018   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[place]) 12/28/2017   Wound swab culture positive 12/28/2017   Primary osteoarthritis of right hand 12/14/2017   Diabetic ulcer of right foot ([place]) 12/14/2017   Cervical pain 09/21/2017   Cervical radiculopathy 09/21/2017   Myofascial pain syndrome, cervical 09/21/2017   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   DM type 2, not at goal [facility]) 09/28/2016   Status post CVA 09/28/2016   Prostate cancer ([place]) 09/28/2016   H/O carotid endarterectomy 11/03/2014   Other diseases of vocal cords 11/03/2014   Contact with powered lawnmower as cause of accidental injury at home as place of occurrence 10/03/2014   Essential hypertension with goal blood pressure less than 140/90 10/03/2014   Type 2 diabetes mellitus with diabetic polyneuropathy ([place]) 10/03/2014   Cough 02/28/2014   Acute pharyngitis 02/28/2014   Frequent urination 02/28/2014  Balanitis 02/28/2014   Fatigue 02/28/2014   Acute upper respiratory infection 02/18/2014   Hyperlipidemia, mixed 05/11/2013   Poor dental hygiene 05/11/2013   Carotid artery disease ([place]) 05/11/2013   Disorder of artery or arteriole ([place]) 05/11/2013   Chicken pox    Stroke ([place])    Fall    Shingles    Diabetes mellitus without complication ([place])    Sleep apnea    Peripheral neuropathy    Vocal cord dysfunction    Allergy    GERD (gastroesophageal reflux disease)     Past Surgical History:  Procedure Laterality Date   APPENDECTOMY  2000    ruptured   CAROTID ENDARTERECTOMY  09/2010   EYE SURGERY  2010   cataracts bilateral   IR RADIOLOGIST EVAL & MGMT  06/08/2018       Family History  Problem Relation Age of Onset   Cancer Mother        kidney   Heart attack Father    Stroke Brother    Heart attack Brother    Stroke Brother     Social History   Tobacco Use   Smoking status: Former    Types: Cigars    Start date: 11/27/1997   Smokeless tobacco: Never  Vaping Use   Vaping Use: Never used  Substance Use Topics   Alcohol use: Yes    Alcohol/week: 0.0 standard drinks    Comment: occasionally drinks wine    Drug use: No    Home Medications Prior to Admission medications   Medication Sig Start Date End Date Taking? Authorizing Provider  aspirin 81 MG tablet Take 81 mg by mouth daily.   Yes [provider]  Carboxymethylcellulose Sodium 0.25 % SOLN Place 1 drop into both eyes 4 (four) times daily as needed (dry, irritated eyes.). 08/12/20  Yes [provider]  colchicine 0.6 MG tablet Take 1 tablet (0.6 mg total) by mouth 2 (two) times daily for 10 days. 08/15/20 08/25/20 Yes Dorie Rank, MD  metFORMIN (GLUCOPHAGE) 1000 MG tablet Take 1,000 mg by mouth daily with breakfast.   Yes [provider]  ondansetron (ZOFRAN ODT) 8 MG disintegrating tablet Take 1 tablet (8 mg total) by mouth every 8 (eight) hours as needed for nausea or vomiting. 08/15/20  Yes Dorie Rank, MD  pantoprazole (PROTONIX) 20 MG tablet Take 1 tablet (20 mg total) by mouth 2 (two) times daily for 7 days. 08/15/20 08/22/20 Yes Dorie Rank, MD  pregabalin (LYRICA) 100 MG capsule Take 100 mg by mouth 2 (two) times daily.   Yes [provider]  Adhesive Bandages (SM BANDAGES FABRIC) MISC Apply to wounds as needed.  1 3/4"x4" bandage Marina Goodell by [facility] Patient not taking: Reported on 08/15/2020 07/05/20   Luetta Nutting, DO  [facility] WOUND DRESSING) GEL Apply 1 application topically daily. Patient not taking:  Reported on 08/15/2020 05/06/18   Silverio Decamp, MD    Allergies    Aggrenox [aspirin-dipyridamole er]  Review of Systems   Review of Systems  Cardiovascular:  Positive for chest pain.  Musculoskeletal:  Positive for back pain.  All other systems reviewed and are negative.  Physical Exam Updated Vital Signs BP 106/61   Pulse 84   Temp 98.6 F (37 C) (Temporal)   Resp 19   Ht 1.88 m ('6\' 2"'$ )   Wt 100.7 kg   SpO2 100%   BMI 28.50 kg/m   Physical Exam Vitals and nursing note reviewed.  Constitutional:  General: He is not in acute distress.    Appearance: He is well-developed.  HENT:     Head: Normocephalic and atraumatic.     Right Ear: External ear normal.     Left Ear: External ear normal.  Eyes:     General: No scleral icterus.       Right eye: No discharge.        Left eye: No discharge.     Conjunctiva/sclera: Conjunctivae normal.  Neck:     Trachea: No tracheal deviation.  Cardiovascular:     Rate and Rhythm: Normal rate and regular rhythm.  Pulmonary:     Effort: Pulmonary effort is normal. No respiratory distress.     Breath sounds: Normal breath sounds. No stridor. No wheezing or rales.  Abdominal:     General: Bowel sounds are normal. There is no distension.     Palpations: Abdomen is soft.     Tenderness: There is no abdominal tenderness. There is no guarding or rebound.  Musculoskeletal:        General: No tenderness or deformity.     Cervical back: Neck supple.     Comments: Superficial wound left lower extremity, patient states it is chronic issue, no surrounding erythema or drainage  Skin:    General: Skin is warm and dry.     Findings: No rash.  Neurological:     General: No focal deficit present.     Mental Status: He is alert.     Cranial Nerves: No cranial nerve deficit (no facial droop, extraocular movements intact, no slurred speech).     Sensory: No sensory deficit.     Motor: No abnormal muscle tone or seizure activity.      Coordination: Coordination normal.  Psychiatric:        Mood and Affect: Mood normal.    ED Results / Procedures / Treatments   Labs (all labs ordered are listed, but only abnormal results are displayed) Labs Reviewed  COMPREHENSIVE METABOLIC PANEL - Abnormal; Notable for the following components:      Result Value   Glucose, Bld 207 (*)    Total Bilirubin 1.6 (*)    All other components within normal limits  LIPASE, BLOOD - Abnormal; Notable for the following components:   Lipase 62 (*)    All other components within normal limits  CBG MONITORING, ED - Abnormal; Notable for the following components:   Glucose-Capillary 196 (*)    All other components within normal limits  CBC  TROPONIN I (HIGH SENSITIVITY)  TROPONIN I (HIGH SENSITIVITY)    EKG EKG Interpretation  Date/Time:  Thursday August 15 2020 11:22:29 EDT Ventricular Rate:  87 PR Interval:    QRS Duration: 96 QT Interval:  368 QTC Calculation: 443 R Axis:   64 Text Interpretation: probable sinus rhythm ST elevation suggests acute pericarditis vs ischemia No significant change was found compared to ECG earlier today Confirmed by Dorie Rank [id number]) on 08/15/2020 11:31:56 AM  Radiology DG Chest Portable 1 View  Result Date: 08/15/2020 CLINICAL DATA:  Chest pain EXAM: PORTABLE CHEST 1 VIEW COMPARISON:  None. FINDINGS: Heart is normal size. No confluent airspace opacities or effusions. No acute bony abnormality. Scattered aortic calcifications. IMPRESSION: No active disease. Electronically Signed   By: Rolm Baptise M.D.   On: 08/15/2020 12:20   CT Angio Chest/Abd/Pel for Dissection W and/or Wo Contrast  Result Date: 08/15/2020 CLINICAL DATA:  Thoracic aortic aneurysm (TAA) suspected EXAM: CT ANGIOGRAPHY CHEST, ABDOMEN AND PELVIS  TECHNIQUE: Non-contrast CT of the chest was initially obtained. Multidetector CT imaging through the chest, abdomen and pelvis was performed using the standard protocol during bolus administration  of intravenous contrast. Multiplanar reconstructed images and MIPs were obtained and reviewed to evaluate the vascular anatomy. CONTRAST:  146m OMNIPAQUE IOHEXOL 350 MG/ML SOLN COMPARISON:  May 13, 2018. FINDINGS: CTA CHEST FINDINGS Cardiovascular: Preferential opacification of the thoracic aorta. No evidence of thoracic aortic aneurysm or dissection. Enlarged heart size. Atherosclerotic calcifications of the aorta. Three-vessel coronary artery atherosclerotic calcifications. Coarse calcifications of the pericardium predominately anteriorly. Trace pericardial fluid. Mediastinum/Nodes: Thyroid is unremarkable. No axillary or mediastinal adenopathy. Lungs/Pleura: No pleural effusion or pneumothorax. Trachea is patent. Scattered bibasilar atelectasis. There are scattered pulmonary nodules as follows: Nodule 1: RIGHT upper lobe pulmonary nodule measures 3 mm (series 9, image 59). Nodule 2: LEFT upper lobe pulmonary nodule measures 2 mm (series 9, image 47). Musculoskeletal: No chest wall abnormality. No acute or significant osseous findings. Review of the MIP images confirms the above findings. CTA ABDOMEN AND PELVIS FINDINGS VASCULAR Aorta: Normal caliber aorta without aneurysm, dissection, vasculitis or significant stenosis. Moderate soft and calcified plaque throughout its course. Celiac: Patent. SMA: Patent. Atherosclerotic calcifications at the origin resulted in moderate narrowing of the origin. Renals: Patent atherosclerotic plaque of the origin of the RIGHT renal artery results in moderate narrowing. IMA: Patent Inflow: Patent with scattered atherosclerotic plaque. Veins: No obvious venous abnormality within the limitations of this arterial phase study. Review of the MIP images confirms the above findings. NON-VASCULAR Hepatobiliary: Mildly nodular contour of the liver. Gallbladder is unremarkable. Relative hypertrophy of the LEFT liver. Pancreas: Unremarkable. No pancreatic ductal dilatation or surrounding  inflammatory changes. Spleen: Normal in size without focal abnormality. Adrenals/Urinary Tract: Adrenal glands are unremarkable. Kidneys enhance symmetrically. No hydronephrosis. No obstructive nephrolithiasis. Bladder is decompressed. Mild circumferential bladder wall thickening, likely due to degree of distension. Stomach/Bowel: Small hiatal hernia with a patulous appearance of the esophagus proximally. No evidence of bowel obstruction. Status post appendectomy. Moderate to large colonic stool burden diffusely throughout the colon. Lymphatic: No suspicious lymph nodes. Reproductive: Coarse calcifications of the prostate. Other: Fat containing inguinal hernias. Superficial fat stranding within the anterior subcutaneous tissues most consistent with sequela of injection. Musculoskeletal: No acute or significant osseous findings. Review of the MIP images confirms the above findings. IMPRESSION: 1. No evidence of acute aortic injury. 2. There are calcifications of the pericardium. This is nonspecific and could reflect sequela prior pericarditis, previous trauma, uremia or autoimmune disorder. 3. Small hiatal hernia with patulous appearance of the proximal esophagus. 4. Mildly nodular contour of the liver with relative LEFT liver hypertrophy. This could reflect underlying cirrhosis in the appropriate clinical setting. 5. Scattered tiny pulmonary nodules measuring up to 3 mm. No follow-up needed if patient is low-risk (and has no known or suspected primary neoplasm). Non-contrast chest CT can be considered in 12 months if patient is high-risk. This recommendation follows the consensus statement: Guidelines for Management of Incidental Pulmonary Nodules Detected on CT Images: From the Fleischner Society 2017; Radiology 2017; 284:228-243. Aortic Atherosclerosis (ICD10-I70.0). Electronically Signed   By: SValentino SaxonMD   On: 08/15/2020 16:07    Procedures Procedures   Medications Ordered in ED Medications   nitroGLYCERIN (NITROSTAT) SL tablet 0.4 mg (has no administration in time range)  0.9 %  sodium chloride infusion ( Intravenous New Bag/Given 08/15/20 1136)  morphine 4 MG/ML injection 4 mg (4 mg Intravenous Given 08/15/20 1135)  iohexol (OMNIPAQUE)  350 MG/ML injection 100 mL (100 mLs Intravenous Contrast Given 08/15/20 1524)    ED Course  I have reviewed the triage vital signs and the nursing notes.  Pertinent labs & imaging results that were available during my care of the patient were reviewed by me and considered in my medical decision making (see chart for details).  Clinical Course as of 08/15/20 1629  Thu Aug 15, 2020  1322 Initial troponin is normal.  Lipase slightly elevated at 62 [JK]  1323 Chest x-ray without acute findings [JK]  1324 We will proceed with CT scan [JK]  1618 CT scan findings reviewed.  No acute abnormalities [JK]    Clinical Course User Index [JK] Dorie Rank, MD   MDM Rules/Calculators/A&P HEAR Score: 6                         Patient presented to the ED with complaints of back pain.  Patient was noted to have slight abnormalities EKG.   patient denies any chest pain or shortness of breath.  Serial troponins are normal.  No signs of acute coronary syndrome.  CTA angiogram performed to evaluate for the possibility of aortic dissection or thoracic aortic disease.  No signs of acute abnormality noted on the CT scan.Patient's EKG is consistent with the possibility of pericarditis.  Findings on CT scan did suggest the possibility of sequela of prior pericarditis.  Patient states he is feeling better after treatment in the ED.  Is also possible symptoms may be related to his hiatal hernia.  Will discharge home on antacids.  Also try a course of colchicine for possible pericarditis.  Outpatient follow-up with PCP. Final Clinical Impression(s) / ED Diagnoses Final diagnoses:  Acute back pain, unspecified back location, unspecified back pain laterality  Gastroesophageal  reflux disease, unspecified whether esophagitis present    Rx / DC Orders ED Discharge Orders          Ordered    pantoprazole (PROTONIX) 20 MG tablet  2 times daily        08/15/20 1622    ondansetron (ZOFRAN ODT) 8 MG disintegrating tablet  Every 8 hours PRN        08/15/20 1622    colchicine 0.6 MG tablet  2 times daily        08/15/20 1627             Dorie Rank, MD 08/15/20 [phone]

## 2020-08-16 ENCOUNTER — Telehealth: Payer: Self-pay

## 2020-08-16 NOTE — Telephone Encounter (Signed)
Transition Care Management Follow-up Telephone Call Date of discharge and from where: 08/15/2020 from [facility] How have you been since you were released from the hospital? Pt stated that he is feeling like his normal self and did not have any questions or concerns at this time.  Any questions or concerns? No  Items Reviewed: Did the pt receive and understand the discharge instructions provided? Yes  Medications obtained and verified? Yes  Other? No  Any new allergies since your discharge? No  Dietary orders reviewed? No Do you have support at home? Yes    Functional Questionnaire: (I = Independent and D = Dependent) ADLs: I  Bathing/Dressing- I  Meal Prep- I  Eating- I  Maintaining continence- I  Transferring/Ambulation- I  Managing Meds- I   Follow up appointments reviewed:  PCP Hospital f/u appt confirmed? No   Specialist Hospital f/u appt confirmed? No   Are transportation arrangements needed? No  If their condition worsens, is the pt aware to call PCP or go to the Emergency Dept.? Yes Was the patient provided with contact information for the PCP's office or ED? Yes Was to pt encouraged to call back with questions or concerns? Yes

## 2020-08-16 NOTE — Telephone Encounter (Signed)
Transition Care Management Unsuccessful Follow-up Telephone Call  Date of discharge and from where:  08/15/2020 from [facility]  Attempts:  1st Attempt  Reason for unsuccessful TCM follow-up call:  Left voice message

## 2020-08-22 ENCOUNTER — Ambulatory Visit (INDEPENDENT_AMBULATORY_CARE_PROVIDER_SITE_OTHER): Payer: Medicare HMO | Admitting: Medical-Surgical

## 2020-08-22 ENCOUNTER — Other Ambulatory Visit: Payer: Self-pay

## 2020-08-22 ENCOUNTER — Encounter: Payer: Self-pay | Admitting: Medical-Surgical

## 2020-08-22 VITALS — BP 124/65 | HR 65 | Resp 20 | Ht 74.0 in | Wt 221.0 lb

## 2020-08-22 DIAGNOSIS — M79672 Pain in left foot: Secondary | ICD-10-CM | POA: Diagnosis not present

## 2020-08-22 DIAGNOSIS — L03116 Cellulitis of left lower limb: Secondary | ICD-10-CM

## 2020-08-22 DIAGNOSIS — R600 Localized edema: Secondary | ICD-10-CM | POA: Diagnosis not present

## 2020-08-22 MED ORDER — CEPHALEXIN 500 MG PO CAPS: 500.0000 mg | ORAL_CAPSULE | Freq: Three times a day (TID) | ORAL | 0 refills | Status: DC

## 2020-08-22 NOTE — Progress Notes (Signed)
  HPI with pertinent ROS:   CC: Left leg pain/swelling  HPI: Pleasant 75 year old male presenting today for evaluation of left leg swelling and pain.  He does have several open wounds to the left lower leg and dorsal foot that he has been caring for at home.  Notes he has had increased yellow, foul-smelling drainage accompanied by an increase in pain and significant swelling from the knee down.  He has compression socks at home but has not been able to get them on.  He got very frustrated a couple of days ago and massaged his left calf down towards his foot to "push the fluid out".  Also considered using a syringe she has at home to see if he could suck the fluid out to get some relief.  Has several grievances from several different medical visits and currently is not managed by wound care per his preference.  Denies fever, chills, new myalgias.  I reviewed the past medical history, family history, social history, surgical history, and allergies today and no changes were needed.  Please see the problem list section below in epic for further details.   Physical exam:   General: Well Developed, well nourished, and in no acute distress.  Neuro: Alert and oriented x3.  HEENT: Normocephalic, atraumatic.  Skin: Warm and dry.  Large ulcer to the dorsal left foot, wound edges looking good with subcutaneous tissue noted in the wound bed.  Several small open ulcerations to the left lower leg and ankle.  Significant edema from left knee knee to toe. Cardiac: Regular rate and rhythm, no murmurs rubs or gallops, no lower extremity edema.  Respiratory: Clear to auscultation bilaterally. Not using accessory muscles, speaking in full sentences.  Impression and Recommendations:    1. Left foot pain 2. Edema of left foot 3. Cellulitis of left lower extremity Significant pain, swelling, and malodorous disc indicative of cellulitis in the setting of open wounds.  He reports doxycycline was not helpful so we are  sending in Keflex 500 mg 3 times daily for 7 days.  Strongly recommend maintaining compression and elevation on the left lower extremity.  Since he cannot currently put compression socks on, recommend using Ace wraps as these are adjustable to his compression needs.  During appointment, dressing placed to open wound on the top of his left foot followed by a sock and then Ace wraps applied from toe to knee.  Patient reported his leg symptoms felt better with the compression already.  He will follow-up with me in 1 week to evaluate his progress.  Return in about 1 week (around 08/29/2020) for left foot wound and swelling follow up. ___________________________________________ Clearnce Sorrel, DNP, APRN, FNP-BC Primary Care and [place]

## 2020-08-27 ENCOUNTER — Encounter: Payer: Self-pay | Admitting: Medical-Surgical

## 2020-08-27 ENCOUNTER — Ambulatory Visit (INDEPENDENT_AMBULATORY_CARE_PROVIDER_SITE_OTHER): Payer: Medicare HMO | Admitting: Medical-Surgical

## 2020-08-27 VITALS — HR 79 | Resp 20 | Wt 219.0 lb

## 2020-08-27 DIAGNOSIS — R600 Localized edema: Secondary | ICD-10-CM | POA: Diagnosis not present

## 2020-08-27 DIAGNOSIS — L03116 Cellulitis of left lower limb: Secondary | ICD-10-CM | POA: Diagnosis not present

## 2020-08-27 DIAGNOSIS — M79672 Pain in left foot: Secondary | ICD-10-CM

## 2020-08-27 MED ORDER — SILVER SULFADIAZINE 1 % EX CREA: 1.0000 "application " | TOPICAL_CREAM | Freq: Every day | CUTANEOUS | 0 refills | Status: DC

## 2020-08-27 NOTE — Progress Notes (Signed)
  HPI with pertinent ROS:   CC: Left leg swelling and foot pain follow-up  HPI: Pleasant 75 year old male presenting today for follow-up on left foot pain and left lower extremity edema.  He was seen 1 week ago with significant swelling of the lower left leg from knee down to toes which was causing increasing pain.  The lower extremity has several open areas that are superficial with serous weeping.  He did have some malodorous drainage last week so we did a 7-day course of Keflex.  He has completed this as prescribed and notes that there is no further odor to the drainage of his leg.  He has been following instructions to keep the open wounds covered and use Ace wraps for compression of the lower extremity.  Today, he reports marked improvement in both his pain as well as his swelling.  He continues to use the silver impregnated dressing to the larger wound on the top of his left foot but has been applying Neosporin to the open areas on his lower leg before wrapping with gauze.  He also gauze in place with tape and a sock and then applies the Ace wrap over the sock for compression.  He does still have increased edema with prolonged dependent positioning which leads to pain, especially in his toes.  I reviewed the past medical history, family history, social history, surgical history, and allergies today and no changes were needed.  Please see the problem list section below in epic for further details.   Physical exam:   General: Well Developed, well nourished, and in no acute distress.  Neuro: Alert and oriented x3.  HEENT: Normocephalic, atraumatic.  Skin: Warm and dry.  Dressing intact to the left dorsal foot wound.  Several scattered superficial open wounds to the left lower leg with pink and yellow wound bases.  Serous drainage noted on gauze but no purulent drainage or foul odor. Cardiac: Regular rate and rhythm, no murmurs rubs or gallops, no lower extremity edema.  Respiratory: Clear to  auscultation bilaterally. Not using accessory muscles, speaking in full sentences.  Impression and Recommendations:    1. Edema of left foot 2. Cellulitis of left lower extremity 3. Left foot pain Quite a bit of improvement in his swelling and overall wound appearance in the last week.  Since he has superficial wounds in multiple areas, feel that he needs to do dressings with an antimicrobial agent.  For ease of use, sending in Silvadene cream daily to open wounds only.  Advised patient to avoid using this on good skin.  Cover with gauze and secure with rolled gauze if available.  Okay to place a sock over this and then wrapped from toe to knee with Ace wraps for compression.  Once swelling has reduced to near normal, recommend using compression stockings or knee-high compression socks in place of Ace wraps.  Return in about 1 week (around 09/03/2020) for wound check for left leg. ___________________________________________ Clearnce Sorrel, DNP, APRN, FNP-BC Primary Care and [place]

## 2020-08-30 ENCOUNTER — Telehealth: Payer: Self-pay

## 2020-08-30 NOTE — Telephone Encounter (Signed)
Pt has seen Dr. Charna Archer for a few visits, and liked her a lot, and wanted to change PCP from Dr. Zigmund Daniel to Dr. Charna Archer. He has an appt with her coming up soon. If it's okay with the both of you?

## 2020-08-30 NOTE — Telephone Encounter (Signed)
That is fine with me if ok with Joy.

## 2020-09-03 ENCOUNTER — Encounter: Payer: Self-pay | Admitting: Medical-Surgical

## 2020-09-03 ENCOUNTER — Ambulatory Visit (INDEPENDENT_AMBULATORY_CARE_PROVIDER_SITE_OTHER): Payer: Medicare HMO | Admitting: Medical-Surgical

## 2020-09-03 ENCOUNTER — Other Ambulatory Visit: Payer: Self-pay

## 2020-09-03 VITALS — BP 137/65 | HR 67 | Resp 20 | Wt 222.0 lb

## 2020-09-03 DIAGNOSIS — R600 Localized edema: Secondary | ICD-10-CM | POA: Diagnosis not present

## 2020-09-03 DIAGNOSIS — L03116 Cellulitis of left lower limb: Secondary | ICD-10-CM

## 2020-09-03 NOTE — Progress Notes (Signed)
  HPI with pertinent ROS:   CC: Wound check  HPI: Very pleasant 75 year old male presenting today for follow-up on left lower extremity edema and wounds.  He did do very well over the last week and is very happy with his progress so far.  He used the Silvadene cream but unfortunately he applied to the whole leg which caused the skin to have a black discoloration.  He did not try simply applying it to the open wounds.  He has been using iodine which helps dry up but is willing to try something different.  Wounds on the lower extremity continue to have some seepage although this is no longer yellow or malodorous.  Swelling has greatly improved and he did not wrap it today.  He is still using the silver impregnated gauze on the lesion on the top of his foot as instructed.  Denies fever, chills, myalgias, mental status changes, and purulent drainage.  I reviewed the past medical history, family history, social history, surgical history, and allergies today and no changes were needed.  Please see the problem list section below in epic for further details.   Physical exam:   General: Well Developed, well nourished, and in no acute distress.  Neuro: Alert and oriented x3.  HEENT: Normocephalic, atraumatic.  Skin: Warm and dry.  Left lower extremity edema is greatly improved.  Open wounds to the anterior shin appear to be drying up and closing.  Wound is still present to the posterior calf on the lower leg as well as the medial aspect just above the ankle.  Dressing not removed from the top of his foot due to limited supply of silver impregnated gauze.  Serous drainage noted on dressings present. Cardiac: Regular rate and rhythm, no murmurs rubs or gallops, no lower extremity edema.  Respiratory: Clear to auscultation bilaterally. Not using accessory muscles, speaking in full sentences.  Impression and Recommendations:    1. Edema of left foot Residual edema mild compared to original assessment.   Venous stasis wounds evaluated and appear to be doing very well.  No signs of infection today.  No indication for antibiotics at this point.  Would recommend retrying Silvadene cream but only to open areas rather than the entire lower extremity.  If this is not helpful, we may need to reevaluate for a topical option.  He did not see much benefit with the use of Xeroform gauze.  Educated patient on appearance of wounds including the fact that the yellow layer he sees on top of the wound is fat rather than infection.  Recommend continuing compression to prevent recurrence of edema and worsening of his symptoms.  Patient agreeable to the plan the as directed.  Return in about 1 week (around 09/10/2020) for Wound check.  35 minutes of face-to-face time was provided during this encounter.  ___________________________________________ Clearnce Sorrel, DNP, APRN, FNP-BC Primary Care and [place]

## 2020-09-10 ENCOUNTER — Encounter: Payer: Self-pay | Admitting: Medical-Surgical

## 2020-09-10 ENCOUNTER — Other Ambulatory Visit: Payer: Self-pay

## 2020-09-10 ENCOUNTER — Ambulatory Visit (INDEPENDENT_AMBULATORY_CARE_PROVIDER_SITE_OTHER): Payer: Medicare HMO | Admitting: Medical-Surgical

## 2020-09-10 VITALS — BP 132/64 | HR 63 | Resp 20 | Wt 226.0 lb

## 2020-09-10 DIAGNOSIS — I872 Venous insufficiency (chronic) (peripheral): Secondary | ICD-10-CM | POA: Diagnosis not present

## 2020-09-10 DIAGNOSIS — L97322 Non-pressure chronic ulcer of left ankle with fat layer exposed: Secondary | ICD-10-CM

## 2020-09-10 DIAGNOSIS — L97221 Non-pressure chronic ulcer of left calf limited to breakdown of skin: Secondary | ICD-10-CM

## 2020-09-10 DIAGNOSIS — L97522 Non-pressure chronic ulcer of other part of left foot with fat layer exposed: Secondary | ICD-10-CM

## 2020-09-10 NOTE — Progress Notes (Signed)
  HPI with pertinent ROS:   CC: wound check  HPI: Very pleasant 75 year old male presenting today for wound check on the left lower extremity.  He has done well over the last week with keeping compression on the area and taking care of his open wounds.  Unfortunately, he has been spending quite a bit of time helping take care of a friend who had surgery.  He has slacked a little bit on his self-care measures and because of this, has had a few small blisters develop on the left anterior shin due to fluid collection.  He was using the Silvadene cream on the open areas which seem to be doing very well but unfortunately he had a little bit of extra drainage where he was not elevating his leg as often as he should.  He resumed using iodine on the open spots which did help dry up some of the drainage.  His friend has recovered somewhat from surgery and he will be resuming self-care with an emphasis on keeping his leg elevated in between periods of activity and working to get his wounds to heal up.  I reviewed the past medical history, family history, social history, surgical history, and allergies today and no changes were needed.  Please see the problem list section below in epic for further details.   Physical exam:   General: Well Developed, well nourished, and in no acute distress.  Neuro: Alert and oriented x3.  HEENT: Normocephalic, atraumatic.  Skin: Warm and dry.  Lower extremity wounds do seem to be healing very well with wound sizes decreasing and new skin growth evident.  He does have 3 small serous blisters along the anterior shin but with elevation, these decreased in size.  Gauze dressing in place on arrival but removed for exam.  Replaced with large ABD pads to protect wounds, wrapped with Kerlix, and secured with moderate compression using an Ace wrap from toes to just below the knee. Cardiac: Regular rate and rhythm, no murmurs rubs or gallops, no lower extremity edema.  Respiratory:  Clear to auscultation bilaterally. Not using accessory muscles, speaking in full sentences.  Impression and Recommendations:    1. Ulcer of left foot with fat layer exposed ([place]) 2. 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  Recommend refocusing on self-care and maintaining compression during the day and elevation in the evening.  He does have some pain at night in that foot which seems to improve when the leg is in a dependent position.  This is suspicious for possible claudication but pulses good and skin is pink today.  Recommend resuming use of Silvadene cream on the open areas.  Okay to apply the Silvadene and allow to stay open to air for short periods of time if desired.  Return in about 3 weeks (around 10/01/2020) for wound check. ___________________________________________ Clearnce Sorrel, DNP, APRN, FNP-BC [facility] and [place]

## 2020-09-18 ENCOUNTER — Telehealth: Payer: Self-pay | Admitting: Medical-Surgical

## 2020-09-18 NOTE — Telephone Encounter (Signed)
Left message for patient to call back and schedule Medicare Annual Wellness Visit (AWV) either virtually or in office. I gave my number [phone] for pt to return my call    Last AWV ; 09/04/14 please schedule at anytime with health coach  This should be a 40 minute visit.

## 2020-10-01 ENCOUNTER — Ambulatory Visit: Payer: Medicare HMO | Admitting: Medical-Surgical

## 2020-10-02 ENCOUNTER — Encounter: Payer: Self-pay | Admitting: Medical-Surgical

## 2020-10-02 ENCOUNTER — Ambulatory Visit (INDEPENDENT_AMBULATORY_CARE_PROVIDER_SITE_OTHER): Payer: Medicare HMO | Admitting: Medical-Surgical

## 2020-10-02 VITALS — BP 130/65 | HR 88 | Temp 99.0°F | Resp 20 | Ht 74.0 in | Wt 226.0 lb

## 2020-10-02 DIAGNOSIS — R600 Localized edema: Secondary | ICD-10-CM

## 2020-10-02 DIAGNOSIS — R300 Dysuria: Secondary | ICD-10-CM | POA: Diagnosis not present

## 2020-10-02 DIAGNOSIS — R34 Anuria and oliguria: Secondary | ICD-10-CM

## 2020-10-02 DIAGNOSIS — L03116 Cellulitis of left lower limb: Secondary | ICD-10-CM

## 2020-10-02 DIAGNOSIS — M79672 Pain in left foot: Secondary | ICD-10-CM

## 2020-10-02 DIAGNOSIS — R82998 Other abnormal findings in urine: Secondary | ICD-10-CM | POA: Diagnosis not present

## 2020-10-02 LAB — POCT URINALYSIS DIPSTICK
Bilirubin, UA: NEGATIVE
Blood, UA: NEGATIVE
Glucose, UA: NEGATIVE
Ketones, UA: NEGATIVE
Leukocytes, UA: NEGATIVE
Nitrite, UA: NEGATIVE
Protein, UA: NEGATIVE
Spec Grav, UA: 1.02 (ref 1.010–1.025)
Urobilinogen, UA: 0.2 E.U./dL
pH, UA: 5.5 (ref 5.0–8.0)

## 2020-10-02 MED ORDER — CEPHALEXIN 500 MG PO CAPS: ORAL_CAPSULE | ORAL | 0 refills | Status: AC

## 2020-10-02 NOTE — Progress Notes (Signed)
  HPI with pertinent ROS:   CC: Wound check  HPI: Very pleasant 75 year old male presenting today for wound check of the left lower extremity.  He had been doing very well with healing of the left lower extremity but over the last 2 weeks, reports that his symptoms have taken a turn for the worse.  He has had worsening edema as well as drainage from his open wounds.  Notes the drainage has turned yellow and is foul-smelling.  He has been using iodine topically to help dry up the seepage but this is only helpful temporarily.  Having difficulty with wearing socks and sleeping in the bed because of the drainage.  Not having any fevers but notes that he does not feel well overall.  Reports a decrease in urinary output as well as dark yellowish-brown urine over the last couple of days.  Has had some discomfort with urination as well.  I reviewed the past medical history, family history, social history, surgical history, and allergies today and no changes were needed.  Please see the problem list section below in epic for further details.   Physical exam:   General: Well Developed, well nourished, and in no acute distress.  Neuro: Alert and oriented x3.  HEENT: Normocephalic, atraumatic.  Skin: Warm and dry.  Open wounds remain to the left lower extremity and dorsal foot.  Skin friable over the area due to exposure to wound secretions.  Continued edema from the calf down to the toes.  Clear yellow seepage from several of his open areas however he does have yellowish-green drainage present on a couple of his lower ulcerations. Cardiac: Regular rate and rhythm, no murmurs rubs or gallops, no lower extremity edema.  Respiratory: Clear to auscultation bilaterally. Not using accessory muscles, speaking in full sentences.  Impression and Recommendations:    1. Edema of left foot 2. Cellulitis of left lower extremity 3. 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  Recommend daily soaks for 10 minutes in warm water with antibacterial Dial soap.  Pat dry and cover with Allevyn.  Wrap and rolled gauze and secure with Ace wrap from toe to knee.  Starting Keflex 500 mg 3 times daily for 14 days then reduce to 500 mg once daily for prophylaxis due to diabetic status.  4. Dysuria 5. Decreased urine output 6. Dark urine POCT urinalysis negative.  Some concern for systemic etiology so checking CBC with differential and CMP. - POCT Urinalysis Dipstick - CBC with Differential - COMPLETE METABOLIC PANEL WITH GFR  Return in about 2 weeks (around 10/16/2020) for wound check. ___________________________________________ Clearnce Sorrel, DNP, APRN, FNP-BC Primary Care and [place]

## 2020-10-03 ENCOUNTER — Encounter: Payer: Self-pay | Admitting: Medical-Surgical

## 2020-10-03 DIAGNOSIS — N289 Disorder of kidney and ureter, unspecified: Secondary | ICD-10-CM | POA: Insufficient documentation

## 2020-10-03 LAB — COMPLETE METABOLIC PANEL WITH GFR
AG Ratio: 1.4 (calc) (ref 1.0–2.5)
ALT: 12 U/L (ref 9–46)
AST: 17 U/L (ref 10–35)
Albumin: 4.8 g/dL (ref 3.6–5.1)
Alkaline phosphatase (APISO): 71 U/L (ref 35–144)
BUN/Creatinine Ratio: 17 (calc) (ref 6–22)
BUN: 23 mg/dL (ref 7–25)
CO2: 30 mmol/L (ref 20–32)
Calcium: 10.1 mg/dL (ref 8.6–10.3)
Chloride: 99 mmol/L (ref 98–110)
Creat: 1.39 mg/dL — ABNORMAL HIGH (ref 0.70–1.28)
Globulin: 3.4 g/dL (calc) (ref 1.9–3.7)
Glucose, Bld: 225 mg/dL — ABNORMAL HIGH (ref 65–99)
Potassium: 4.5 mmol/L (ref 3.5–5.3)
Sodium: 141 mmol/L (ref 135–146)
Total Bilirubin: 0.8 mg/dL (ref 0.2–1.2)
Total Protein: 8.2 g/dL — ABNORMAL HIGH (ref 6.1–8.1)
eGFR: 53 mL/min/{1.73_m2} — ABNORMAL LOW (ref >=60)

## 2020-10-03 LAB — CBC WITH DIFFERENTIAL/PLATELET
Absolute Monocytes: 504 cells/uL (ref 200–950)
Basophils Absolute: 54 cells/uL (ref 0–200)
Basophils Relative: 0.6 %
Eosinophils Absolute: 108 cells/uL (ref 15–500)
Eosinophils Relative: 1.2 %
HCT: 43.2 % (ref 38.5–50.0)
Hemoglobin: 14.2 g/dL (ref 13.2–17.1)
Lymphs Abs: 1710 cells/uL (ref 850–3900)
MCH: 30.3 pg (ref 27.0–33.0)
MCHC: 32.9 g/dL (ref 32.0–36.0)
MCV: 92.3 fL (ref 80.0–100.0)
MPV: 9.2 fL (ref 7.5–12.5)
Monocytes Relative: 5.6 %
Neutro Abs: 6624 cells/uL (ref 1500–7800)
Neutrophils Relative %: 73.6 %
Platelets: 375 10*3/uL (ref 140–400)
RBC: 4.68 10*6/uL (ref 4.20–5.80)
RDW: 14 % (ref 11.0–15.0)
Total Lymphocyte: 19 %
WBC: 9 10*3/uL (ref 3.8–10.8)

## 2020-10-10 ENCOUNTER — Encounter: Payer: Self-pay | Admitting: Medical-Surgical

## 2020-10-10 ENCOUNTER — Other Ambulatory Visit: Payer: Self-pay

## 2020-10-10 ENCOUNTER — Ambulatory Visit (INDEPENDENT_AMBULATORY_CARE_PROVIDER_SITE_OTHER): Payer: Medicare HMO | Admitting: Medical-Surgical

## 2020-10-10 VITALS — BP 129/69 | HR 71 | Resp 20 | Ht 74.0 in | Wt 220.0 lb

## 2020-10-10 DIAGNOSIS — L89893 Pressure ulcer of other site, stage 3: Secondary | ICD-10-CM | POA: Diagnosis not present

## 2020-10-10 DIAGNOSIS — R600 Localized edema: Secondary | ICD-10-CM

## 2020-10-10 DIAGNOSIS — L03116 Cellulitis of left lower limb: Secondary | ICD-10-CM | POA: Diagnosis not present

## 2020-10-10 NOTE — Progress Notes (Signed)
  HPI with pertinent ROS:   CC: Wound check  HPI: Pleasant 75 year old male presenting today for wound check on the left lower extremity.  Long history of edema, recurrent cellulitis, and venous stasis ulcers to the left lower extremity.  We have been treating this with frequent visits and close monitoring.  During our last visit, findings indicated a recurrence of cellulitis so we placed him on Keflex. He has been taking this as prescribed, tolerating well without side effects. No further purulent drainage or foul odor from his wounds. He continues to have some swelling to the lower extremity and is using an ACE wrap to provide compression. Continues to use Iodine topically since this is the only thing that helps with the seepage from the open wounds. Is concerned about his pinky toe as the area between the 4th and 5th digits has been seeping. Notes that there was something black on it and he and his friend looked at it, then pulled the black part off. He has not used any further gauze or treatments to this area.   I reviewed the past medical history, family history, social history, surgical history, and allergies today and no changes were needed.  Please see the problem list section below in epic for further details.   Physical exam:   General: Well Developed, well nourished, and in no acute distress.  Neuro: Alert and oriented x3.  HEENT: Normocephalic, atraumatic.  Skin: Warm and dry. Continued edema to the left lower extremity from the mid-calf to the toes. Several shallow venous stasis ulcers with clear yellow serous drainage noted. Skin discolored with iodine. Gauze dressing intact to wound on the top of the foot, not removed today. Medial 5th toe and lateral 4th toe pressure ulcers noted with serous drainage and excoriation to the wound edges.  Cardiac: Regular rate and rhythm, no murmurs rubs or gallops, no lower extremity edema.  Respiratory: Clear to auscultation bilaterally. Not using  accessory muscles, speaking in full sentences.  Impression and Recommendations:    1. Edema of left foot Continue compression with compression socks or ACE wrap. Wrap from toes to knee. Elevated foot whenever possible. Continue low sodium diet.   2. Cellulitis of left lower extremity Resolved. Continue Keflex as prescribed.   3. 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 Unfortunately, his toes are very close and provide continued pressure. Allevyn cut to fit and placed between the toes to provide padding and pressure relief. This should also provide some absorbency. Mepilex AG ordered to trial as I think this will help with drainage and pressure relief. We will have him return to have this place in office to demonstrate proper use.   Return in about 11 days (around 10/21/2020) for wound check. ___________________________________________ Clearnce Sorrel, DNP, APRN, FNP-BC Primary Care and [place]

## 2020-10-21 ENCOUNTER — Encounter: Payer: Self-pay | Admitting: Medical-Surgical

## 2020-10-21 ENCOUNTER — Ambulatory Visit (INDEPENDENT_AMBULATORY_CARE_PROVIDER_SITE_OTHER): Payer: Medicare HMO | Admitting: Medical-Surgical

## 2020-10-21 VITALS — BP 116/63 | HR 66 | Resp 20 | Ht 74.0 in | Wt 223.0 lb

## 2020-10-21 DIAGNOSIS — L97222 Non-pressure chronic ulcer of left calf with fat layer exposed: Secondary | ICD-10-CM

## 2020-10-21 DIAGNOSIS — L97522 Non-pressure chronic ulcer of other part of left foot with fat layer exposed: Secondary | ICD-10-CM | POA: Diagnosis not present

## 2020-10-21 DIAGNOSIS — R600 Localized edema: Secondary | ICD-10-CM

## 2020-10-21 DIAGNOSIS — I872 Venous insufficiency (chronic) (peripheral): Secondary | ICD-10-CM

## 2020-10-21 DIAGNOSIS — L89893 Pressure ulcer of other site, stage 3: Secondary | ICD-10-CM | POA: Diagnosis not present

## 2020-10-21 NOTE — Progress Notes (Signed)
  HPI with pertinent ROS:   CC: Wound check  HPI: Pleasant 75 year old male presenting today for wound check of the left lower extremity.  Has been using Silvadene cream to the open areas at times but when serous fluid increases, he goes back to using iodine.  Has difficulty with wrapping his foot with the Ace wrap to maintain compression but has been having his lady friend help him with this.  Tries to keep his foot elevated whenever possible to help with swelling but notes that the swelling in the top of his foot never does truly go down.  Denies fevers, chills, and myalgias.  Pain associated with his left foot related to swelling rather than open ulcerations.  I reviewed the past medical history, family history, social history, surgical history, and allergies today and no changes were needed.  Please see the problem list section below in epic for further details.   Physical exam:   General: Well Developed, well nourished, and in no acute distress.  Neuro: Alert and oriented x3.  HEENT: Normocephalic, atraumatic.  Skin: Warm and dry.  Open wound to the top of the left foot appears to be healing well although progress is slow.  Several open ulcerated areas along the left anterior and posterior lower leg with serous fluid, no purulent drainage or significant erythema.  Pressure ulcers to fourth and fifth toe of left foot with minimal drainage, no dressing in place on arrival. Cardiac: Regular rate and rhythm, no murmurs rubs or gallops, no lower extremity edema.  Respiratory: Clear to auscultation bilaterally. Not using accessory muscles, speaking in full sentences.  Impression and Recommendations:    1. 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 Edema of left foot 3. Ulcer of left foot with fat layer exposed ([place]) 4. 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  Discussed maintaining good control of diabetes in the setting of open wounds to prevent further infection.  Today, the left lower extremity was washed with antibacterial soap and warm water, rinse thoroughly, and patted dry.  Mepilex Ag gauze cut to fit and placed on open wounds including the area between his fourth and fifth toes.  This was then wrapped in Kerlix and secured in place with mild compression using an Ace wrap.  Continue elevation.  Advised patient to keep Mepilex in place for the next 7 days to absorb drainage.  Okay to change rolled gauze but do not discard Mepilex.  Continue wrapping with Ace wrap to tolerance to help with swelling.  We will have him return in 1 week for further evaluation.  Return in about 1 week (around 10/28/2020) for Wound check. ___________________________________________ Clearnce Sorrel, DNP, APRN, FNP-BC Primary Care and [place]

## 2020-10-28 ENCOUNTER — Encounter: Payer: Self-pay | Admitting: Medical-Surgical

## 2020-10-28 ENCOUNTER — Other Ambulatory Visit: Payer: Self-pay

## 2020-10-28 ENCOUNTER — Ambulatory Visit (INDEPENDENT_AMBULATORY_CARE_PROVIDER_SITE_OTHER): Payer: Medicare HMO | Admitting: Medical-Surgical

## 2020-10-28 VITALS — BP 134/71 | HR 71 | Resp 20 | Ht 74.0 in | Wt 232.0 lb

## 2020-10-28 DIAGNOSIS — I872 Venous insufficiency (chronic) (peripheral): Secondary | ICD-10-CM | POA: Diagnosis not present

## 2020-10-28 DIAGNOSIS — L97522 Non-pressure chronic ulcer of other part of left foot with fat layer exposed: Secondary | ICD-10-CM | POA: Diagnosis not present

## 2020-10-28 DIAGNOSIS — L89893 Pressure ulcer of other site, stage 3: Secondary | ICD-10-CM

## 2020-10-28 DIAGNOSIS — L97222 Non-pressure chronic ulcer of left calf with fat layer exposed: Secondary | ICD-10-CM | POA: Diagnosis not present

## 2020-10-28 NOTE — Progress Notes (Signed)
  HPI with pertinent ROS:   CC: wound check  HPI: Pleasant 75 year old male presenting today for wound check on left lower extremity.  1 week ago, we applied Mepilex Ag to the open wounds and covered with Kerlix and secured with an Ace wrap.  He was advised that he was okay to change the Ace wrap and Kerlix but to leave the Mepilex in place.  Unfortunately a few days ago the Mepilex fell off.  He was able to get most of them back in place which he secured with Band-Aids.  Has been using compression socks and Ace wraps for his swelling which seems to be getting better.  He does still have some swelling to the top of his foot which is the only spot that causes him pain late at night.  Has not noticed much drainage coming from the wounds since our visit last week.  Denies fever, chills, myalgias, and purulent discharge.  I reviewed the past medical history, family history, social history, surgical history, and allergies today and no changes were needed.  Please see the problem list section below in epic for further details.   Physical exam:   General: Well Developed, well nourished, and in no acute distress.  Neuro: Alert and oriented x3.  HEENT: Normocephalic, atraumatic.  Skin: Warm and dry.  Wounds appear to be healing slowly.  Several of his opened shallow ulcerations have scabbed over and are no longer oozing.  Wound on the top of his foot appears to be getting slightly smaller.  Does still have several open areas along the anterior and posterior shin as well as between the fourth and fifth toes. Cardiac: Regular rate and rhythm, no murmurs rubs or gallops, no lower extremity edema.  Respiratory: Clear to auscultation bilaterally. Not using accessory muscles, speaking in full sentences.  Impression and Recommendations:    1. 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 Ulcer of left foot with fat layer exposed ([place]) 3. Venous stasis ulcer of left calf with fat layer exposed without  varicose veins HCC) Dressings were removed and wounds were washed with antibacterial soap.  Dry scaly skin debrided gently with the use of moist gauze.  A light coat of Vaseline applied to healthy skin along the top of the foot and the shin.  Wounds assessed and redressed with Mepilex Ag and secured in place with [place] at patient's request.  Recommend using compression from the toes up once he is home and no longer has to wear his regular shoes.  Advised maintaining on the Mepilex in place for 1 week where we will reevaluate.  1 sheet of 4 inch x 4 inch Mepilex provided to patient in case he does lose any of his pieces.  Return in about 1 week (around 11/04/2020) for Wound check. ___________________________________________ Clearnce Sorrel, DNP, APRN, FNP-BC Primary Care and [place]

## 2020-11-01 ENCOUNTER — Ambulatory Visit: Payer: Medicare HMO | Admitting: Medical-Surgical

## 2020-11-04 ENCOUNTER — Ambulatory Visit (INDEPENDENT_AMBULATORY_CARE_PROVIDER_SITE_OTHER): Payer: Medicare HMO | Admitting: Medical-Surgical

## 2020-11-04 ENCOUNTER — Encounter: Payer: Self-pay | Admitting: Medical-Surgical

## 2020-11-04 VITALS — BP 130/70 | HR 77 | Resp 20 | Ht 74.0 in | Wt 233.0 lb

## 2020-11-04 DIAGNOSIS — I872 Venous insufficiency (chronic) (peripheral): Secondary | ICD-10-CM | POA: Diagnosis not present

## 2020-11-04 DIAGNOSIS — L97522 Non-pressure chronic ulcer of other part of left foot with fat layer exposed: Secondary | ICD-10-CM

## 2020-11-04 DIAGNOSIS — L89893 Pressure ulcer of other site, stage 3: Secondary | ICD-10-CM

## 2020-11-04 DIAGNOSIS — B351 Tinea unguium: Secondary | ICD-10-CM | POA: Diagnosis not present

## 2020-11-04 DIAGNOSIS — L97222 Non-pressure chronic ulcer of left calf with fat layer exposed: Secondary | ICD-10-CM

## 2020-11-04 NOTE — Progress Notes (Signed)
  HPI with pertinent ROS:   CC: wound check  HPI: Pleasant 75 year old male presenting today for wound check of diabetic foot/venous stasis ulcers of the left lower extremity. Has mepilex AG in place, secured with large bandaids over open areas. Swelling a bit worse than last week. Right foot also swelling now. Reports the lasix (managed by the [place]) is not working but has not reached out to them. Also has some blood on his right great toe and the medial aspect of his right second toe. Would  like to see podiatry. Has considered going back to wound care and is okay with a referral but does not want to go anywhere he was previously.   I reviewed the past medical history, family history, social history, surgical history, and allergies today and no changes were needed.  Please see the problem list section below in epic for further details.   Physical exam:   General: Well Developed, well nourished, and in no acute distress.  Neuro: Alert and oriented x3, extra-ocular muscles intact, sensation grossly intact.  HEENT: Normocephalic, atraumatic, pupils equal round reactive to light, neck supple, no masses, no lymphadenopathy, thyroid nonpalpable.  Skin: Warm and dry, no rashes. Cardiac: Regular rate and rhythm, no murmurs rubs or gallops, no lower extremity edema.  Respiratory: Clear to auscultation bilaterally. Not using accessory muscles, speaking in full sentences.  Impression and Recommendations:    1. 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 Ulcer of left foot with fat layer exposed ([place]) 3. 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 Recommend compression socks for the right foot and the ACE wrap for the left. Referring to wound care.   4. Onychomycosis Referring to podiatry.   Return in about 1 week (around 11/11/2020) for Wound check if not in with podiatry by  then. ___________________________________________ Clearnce Sorrel, DNP, APRN, FNP-BC Primary Care and [place]

## 2020-11-08 ENCOUNTER — Other Ambulatory Visit: Payer: Self-pay

## 2020-11-08 ENCOUNTER — Ambulatory Visit: Payer: Medicare HMO | Admitting: Podiatry

## 2020-11-08 ENCOUNTER — Encounter: Payer: Self-pay | Admitting: Podiatry

## 2020-11-08 DIAGNOSIS — I83892 Varicose veins of left lower extremities with other complications: Secondary | ICD-10-CM | POA: Diagnosis not present

## 2020-11-08 DIAGNOSIS — E1142 Type 2 diabetes mellitus with diabetic polyneuropathy: Secondary | ICD-10-CM

## 2020-11-08 DIAGNOSIS — L97929 Non-pressure chronic ulcer of unspecified part of left lower leg with unspecified severity: Secondary | ICD-10-CM | POA: Diagnosis not present

## 2020-11-08 DIAGNOSIS — I83029 Varicose veins of left lower extremity with ulcer of unspecified site: Secondary | ICD-10-CM

## 2020-11-08 DIAGNOSIS — I739 Peripheral vascular disease, unspecified: Secondary | ICD-10-CM

## 2020-11-08 DIAGNOSIS — R609 Edema, unspecified: Secondary | ICD-10-CM

## 2020-11-08 NOTE — Progress Notes (Signed)
Subjective:  Patient ID: Matthew [name], male    DOB: [birth year],   MRN: [id number]  No chief complaint on file.   75 y.o. male presents for concern of diabetic venous stasis wound of his left lower extremity. Has been in the care of Samuel Bouche and was referred here for nail trimming and wound care. Relates he was unable to get into wound care in a timely fashion and does not want to go to any previous wound care he has been already.. States he needs his wound evaluated every week. Denies any pain. Last A1c was  7.3.  . Denies any other pedal complaints. Denies n/v/f/c.   PCP: Samuel Bouche NP  Past Medical History:  Diagnosis Date   Acute upper respiratory infection 02/18/2014   Allergy    Arthritis    Carotid artery disease ([place]) 05/11/2013   Chicken pox as a child   Contact with powered lawnmower as cause of accidental injury at home as place of occurrence 10/03/2014   Diabetes mellitus without complication ([place])    Fall    every since his stroke   GERD (gastroesophageal reflux disease)    Measles    Other and unspecified hyperlipidemia 05/11/2013   Peripheral neuropathy    Poor dental hygiene 05/11/2013   Shingles 75 yrs old   Sleep apnea    [facility]   [facility]) 2007   Vocal cord dysfunction     Objective:  Physical Exam: Vascular: DP/PT pulses 2/4 bilateral. CFT <3 seconds. Normal hair growth on digits. Edema noted to left lateral extremity.  Skin. Left foot venous stasis wounds x 5.  Dorsal left foot measuring 4 cm x 2.5 cm x 0.2 with fibrogranular base mild surrounding erythema. Lateral fourth digit wound measuring 2 cm x 1.5 cm with fibrogranular base.  Multiple leg superficial leg wound with <2 cm base.  Musculoskeletal: MMT 5/5 bilateral lower extremities in DF, PF, Inversion and Eversion. Deceased ROM in DF of ankle joint.  Neurological: Sensation intact to light touch.   Assessment:   1. Type 2 diabetes mellitus with diabetic polyneuropathy, without  long-term current use of insulin (HCC)   2. Venous stasis ulcer of left lower leg with edema of left lower leg (HCC)   3. Peripheral arterial disease ([place])      Plan:  Patient was evaluated and treated and all questions answered. Ulcer left leg venous stasis ulcers -Debridement as below. -Dressed with prisma and unna boot compression dressing applied -Discussed and educated patient on diabetic foot care, especially with  regards to the vascular, neurological and musculoskeletal systems.  -Stressed the importance of good glycemic control and the detriment of not  controlling glucose levels in relation to the foot. -Discussed supportive shoes at all times and checking feet regularly.  -Mechanically debrided all nails 1-5 bilateral using sterile nail nipper and filed with dremel without incident  -Answered all patient questions -Patient advised to call the office if any problems or questions arise in the meantime. -Will follow-up in one week for wound check    Procedure: Excisional Debridement of Wound left foot wound  Rationale: Removal of non-viable soft tissue from the wound to promote healing.  Anesthesia: none Pre-Debridement Wound Measurements: 4 cm x 2.5 cm x 0.2 cm  Post-Debridement Wound Measurements: 4 cm x 2.5 cm x 0.2 cm  Type of Debridement: Sharp Excisional Tissue Removed: Non-viable soft tissue Depth of Debridement: subcutaneous tissue. Technique: Sharp excisional debridement to bleeding, viable wound  base.  Dressing: Dry, sterile, compression dressing. Disposition: Patient tolerated procedure well. Patient to return in 1 week for follow-up.  Return in about 1 week (around 11/15/2020) for wound check.   Lorenda Peck, DPM

## 2020-11-12 ENCOUNTER — Encounter (HOSPITAL_BASED_OUTPATIENT_CLINIC_OR_DEPARTMENT_OTHER): Payer: Medicare HMO | Admitting: Internal Medicine

## 2020-11-13 ENCOUNTER — Ambulatory Visit (INDEPENDENT_AMBULATORY_CARE_PROVIDER_SITE_OTHER): Payer: Medicare HMO | Admitting: Medical-Surgical

## 2020-11-13 ENCOUNTER — Encounter: Payer: Self-pay | Admitting: Medical-Surgical

## 2020-11-13 VITALS — BP 123/70 | HR 76 | Resp 20 | Ht 74.0 in | Wt 225.0 lb

## 2020-11-13 DIAGNOSIS — M79672 Pain in left foot: Secondary | ICD-10-CM | POA: Diagnosis not present

## 2020-11-13 DIAGNOSIS — L89893 Pressure ulcer of other site, stage 3: Secondary | ICD-10-CM

## 2020-11-13 DIAGNOSIS — R600 Localized edema: Secondary | ICD-10-CM | POA: Diagnosis not present

## 2020-11-13 NOTE — Progress Notes (Signed)
  HPI with pertinent ROS:   CC: Wound check  HPI: Pleasant 75 year old male presenting today for wound check of the left lower extremity.  He was able to get in with podiatry who saw him last week and applied an Haematologist.  Unfortunately, after several days, the Unna boot was causing him significant lower extremity pain as he felt it was too tight.  He did remove the Ace wraps a couple of nights ago and then proceeded to remove the entire Unna boot due to discomfort.  He has an appointment with podiatry in a couple of days and is not sure that he wants to go back.  Since he remove the [employer], he has been applying gauze to catch the drainage and using Ace wraps for compression.  Denies fever, chills, and myalgias.  I reviewed the past medical history, family history, social history, surgical history, and allergies today and no changes were needed.  Please see the problem list section below in epic for further details.   Physical exam:   General: Well Developed, well nourished, and in no acute distress.  Neuro: Alert and oriented x3.  HEENT: Normocephalic, atraumatic.  Skin: Warm and dry.  Left lower extremity wounds appear to be somewhat improved from her last visit.  No purulent drainage, worsening edema, or significant erythema. Cardiac: Regular rate and rhythm, no murmurs rubs or gallops, no lower extremity edema.  Respiratory: Clear to auscultation bilaterally. Not using accessory muscles, speaking in full sentences.  Impression and Recommendations:    1. 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 Edema of left foot 3. Left foot pain No signs or symptoms of infection today.  Discussed podiatry treatment with patient.  Encouraged him to make sure to attend his podiatry appointment but provide honest feedback to his provider so that they can make a decision on further treatment together.  His wounds do look somewhat improved so I feel that he Unna boot did make a difference  however his ability to tolerate it is going to be with defining factor.  For today, Mepilex Ag applied to the open wounds, wrapped with Kerlix, and secured in place with Ace wraps for compression.  Return in about 1 week (around 11/20/2020) for Wound check. ___________________________________________ Clearnce Sorrel, DNP, APRN, FNP-BC [facility]

## 2020-11-15 ENCOUNTER — Encounter: Payer: Self-pay | Admitting: Podiatry

## 2020-11-15 ENCOUNTER — Ambulatory Visit (INDEPENDENT_AMBULATORY_CARE_PROVIDER_SITE_OTHER): Payer: Medicare HMO | Admitting: Medical-Surgical

## 2020-11-15 ENCOUNTER — Ambulatory Visit (INDEPENDENT_AMBULATORY_CARE_PROVIDER_SITE_OTHER): Payer: Medicare HMO | Admitting: Podiatry

## 2020-11-15 ENCOUNTER — Other Ambulatory Visit: Payer: Self-pay

## 2020-11-15 DIAGNOSIS — E1142 Type 2 diabetes mellitus with diabetic polyneuropathy: Secondary | ICD-10-CM

## 2020-11-15 DIAGNOSIS — L97929 Non-pressure chronic ulcer of unspecified part of left lower leg with unspecified severity: Secondary | ICD-10-CM

## 2020-11-15 DIAGNOSIS — I83892 Varicose veins of left lower extremities with other complications: Secondary | ICD-10-CM

## 2020-11-15 DIAGNOSIS — R609 Edema, unspecified: Secondary | ICD-10-CM | POA: Diagnosis not present

## 2020-11-15 DIAGNOSIS — Z461 Encounter for fitting and adjustment of hearing aid: Secondary | ICD-10-CM | POA: Insufficient documentation

## 2020-11-15 DIAGNOSIS — Z0000 Encounter for general adult medical examination without abnormal findings: Secondary | ICD-10-CM

## 2020-11-15 DIAGNOSIS — I83029 Varicose veins of left lower extremity with ulcer of unspecified site: Secondary | ICD-10-CM

## 2020-11-15 DIAGNOSIS — I739 Peripheral vascular disease, unspecified: Secondary | ICD-10-CM

## 2020-11-15 DIAGNOSIS — Z460 Encounter for fitting and adjustment of spectacles and contact lenses: Secondary | ICD-10-CM | POA: Insufficient documentation

## 2020-11-15 NOTE — Progress Notes (Signed)
Subjective:  Patient ID: Matthew [name], male    DOB: 15-Feb-1945,   MRN: [id number]  Chief Complaint  Patient presents with   Foot Ulcer    I went to see Joy last Wednesday and I also cut the unna boot off and it was too tight and I need somebody to fix the draining and not have to wrap it    75 y.o. male presents for follow-up of venous stasis wounds. Relates after his last appointment the dressing was too tight and had to cut it off due to the pain. Was seen by Samuel Bouche who redressed and advised him to return to discuss options. Denies any pain today. Last A1c was  7.3.  . Denies any other pedal complaints. Denies n/v/f/c.   PCP: Samuel Bouche NP  Past Medical History:  Diagnosis Date   Acute upper respiratory infection 02/18/2014   Allergy    Arthritis    Carotid artery disease ([place]) 05/11/2013   Chicken pox as a child   Contact with powered lawnmower as cause of accidental injury at home as place of occurrence 10/03/2014   Diabetes mellitus without complication ([place])    Fall    every since his stroke   GERD (gastroesophageal reflux disease)    Measles    Other and unspecified hyperlipidemia 05/11/2013   Peripheral neuropathy    Poor dental hygiene 05/11/2013   Shingles 75 yrs old   Sleep apnea    [facility]   Stroke [facility]) 2007   Vocal cord dysfunction     Objective:  Physical Exam: Vascular: DP/PT pulses 2/4 bilateral. CFT <3 seconds. Normal hair growth on digits. Edema noted to left lateral extremity.  Skin. Left foot venous stasis wounds x 5.  Dorsal left foot measuring 4 cm x 2.3 cm x 0.2 with fibrogranular base mild surrounding erythema. Lateral fourth digit wound measuring  cm x 1.5 cm with fibrogranular base.  Multiple leg superficial leg wound with <2 cm base. On healed from previous. Musculoskeletal: MMT 5/5 bilateral lower extremities in DF, PF, Inversion and Eversion. Deceased ROM in DF of ankle joint.  Neurological: Sensation intact to light touch.    Assessment:   1. Type 2 diabetes mellitus with diabetic polyneuropathy, without long-term current use of insulin (HCC)   2. Venous stasis ulcer of left lower leg with edema of left lower leg (HCC)   3. Peripheral arterial disease ([place])      Plan:  Patient was evaluated and treated and all questions answered. Ulcer left leg venous stasis ulcers -Debridement as below. -Dressed with prisma and unna boot compression dressing applied. Applied loosely today to aid in swelling at night.  -Discussed and educated patient on diabetic foot care, especially with  regards to the vascular, neurological and musculoskeletal systems.  -Stressed the importance of good glycemic control and the detriment of not  controlling glucose levels in relation to the foot. -Discussed supportive shoes at all times and checking feet regularly. As well as keeping feet elevated.  -Answered all patient questions -Patient advised to call the office if any problems or questions arise in the meantime. -Will follow-up in one week for wound check    Procedure: Excisional Debridement of Wound left foot wound  Rationale: Removal of non-viable soft tissue from the wound to promote healing.  Anesthesia: none Pre-Debridement Wound Measurements: 4 cm x 2.3 cm x 0.2 cm  Post-Debridement Wound Measurements: 4 cm x 2.3 cm x 0.2 cm  Type of  Debridement: Sharp Excisional Tissue Removed: Non-viable soft tissue Depth of Debridement: subcutaneous tissue. Technique: Sharp excisional debridement to bleeding, viable wound base.  Dressing: Dry, sterile, compression dressing. Disposition: Patient tolerated procedure well. Patient to return in 1 week for follow-up.  Return in about 1 week (around 11/22/2020) for wound check.   Lorenda Peck, DPM

## 2020-11-15 NOTE — Patient Instructions (Signed)
Matthew [name]  Matthew [name] ,  Thank you for allowing me to perform your Medicare Annual Wellness Visit and for your ongoing commitment to your health.   Health Maintenance & Immunization History Health Maintenance  Topic Date Due   HEMOGLOBIN A1C  11/18/2020 (Originally 01/31/2014)   COVID-19 Vaccine (3 - Pfizer risk series) 12/01/2020 (Originally 07/05/2019)   URINE MICROALBUMIN  12/16/2020 (Originally 08/14/2014)   Zoster Vaccines- Shingrix (1 of 2) 02/15/2021 (Originally 04/03/1964)   INFLUENZA VACCINE  04/18/2021 (Originally 08/19/2020)   Pneumonia Vaccine 29+ Years old (2 - PCV) 11/15/2021 (Originally 01/19/2010)   FOOT EXAM  11/15/2021 (Originally 01/03/2015)   COLONOSCOPY (Pts 45-62yrs Insurance coverage will need to be confirmed)  11/15/2021 (Originally 01/19/2017)   Hepatitis C Screening  11/15/2021 (Originally 04[birth year]03/1964)   OPHTHALMOLOGY EXAM  08/19/2021   TETANUS/TDAP  10/02/2024   HPV VACCINES  Aged Out   Immunization History  Administered Date(s) Administered   PFIZER(Purple Top)SARS-COV-2 Vaccination 05/17/2019, 06/07/2019   Pneumococcal Polysaccharide-23 01/19/2009   Pneumococcal-Unspecified 03/09/2003   Tdap 03/20/2006, 09/20/2014, 10/03/2014    These are the patient goals that we discussed:  Goals Addressed               This Visit's Progress     Patient Stated (pt-stated)        11/15/2020 AWV Goal: Exercise for General Health  Patient will verbalize understanding of the benefits of increased physical activity: Exercising regularly is important. It will improve your overall fitness, flexibility, and endurance. Regular exercise also will improve your overall health. It can help you control your weight, reduce stress, and improve your bone density. Over the next year, patient will increase physical activity as tolerated with a goal of at least 150 minutes of moderate physical activity per week.   You can tell that you are exercising at a moderate intensity if your heart starts beating faster and you start breathing faster but can still hold a conversation. Moderate-intensity exercise ideas include: Walking 1 mile (1.6 km) in about 15 minutes Biking Hiking Golfing Dancing Water aerobics Patient will verbalize understanding of everyday activities that increase physical activity by providing examples like the following: Yard work, such as: Sales promotion account executive Gardening Washing windows or floors Patient will be able to explain general safety guidelines for exercising:  Before you start a new exercise program, talk with your health [name] provider. Do not exercise so much that you hurt yourself, feel dizzy, or get very short of breath. Wear comfortable clothes and wear shoes with good support. Drink plenty of water while you exercise to prevent dehydration or heat stroke. Work out until your breathing and your heartbeat get faster.          This is a list of Health Maintenance Items that are overdue or due now: Pneumococcal vaccine  Influenza vaccine Colorectal cancer screening Hemoglobin A1C Urine Microalbumin Shingrix vaccine Foot exam-ordered  Patient declined all of the vaccines at this time.    Orders/Referrals Placed Today: No orders of the defined types were placed in this encounter.  (Contact our referral department at [phone] if you have not spoken with someone about your referral appointment within the next 5 days)    Follow-up Plan Follow-up with Samuel Bouche, NP as planned Have your eye exam records faxed to our office. Medicare wellness visit in one year. AVS printed and mailed  to the patient.     Health Maintenance, Male Adopting a healthy lifestyle and getting preventive [name] are important in promoting health and wellness. Ask your health [name] provider about: The  right schedule for you to have regular tests and exams. Things you can do on your own to prevent diseases and keep yourself healthy. What should I know about diet, weight, and exercise? Eat a healthy diet  Eat a diet that includes plenty of vegetables, fruits, low-fat dairy products, and lean protein. Do not eat a lot of foods that are high in solid fats, added sugars, or sodium. Maintain a healthy weight Body mass index (BMI) is a measurement that can be used to identify possible weight problems. It estimates body fat based on height and weight. Your health [name] provider can help determine your BMI and help you achieve or maintain a healthy weight. Get regular exercise Get regular exercise. This is one of the most important things you can do for your health. Most adults should: Exercise for at least 150 minutes each week. The exercise should increase your heart rate and make you sweat (moderate-intensity exercise). Do strengthening exercises at least twice a week. This is in addition to the moderate-intensity exercise. Spend less time sitting. Even light physical activity can be beneficial. Watch cholesterol and blood lipids Have your blood tested for lipids and cholesterol at 74 years of age, then have this test every 5 years. You may need to have your cholesterol levels checked more often if: Your lipid or cholesterol levels are high. You are older than 74 years of age. You are at high risk for heart disease. What should I know about cancer screening? Many types of cancers can be detected early and may often be prevented. Depending on your health history and family history, you may need to have cancer screening at various ages. This may include screening for: Colorectal cancer. Prostate cancer. Skin cancer. Lung cancer. What should I know about heart disease, diabetes, and high blood pressure? Blood pressure and heart disease High blood pressure causes heart disease and increases the  risk of stroke. This is more likely to develop in people who have high blood pressure readings, are of African descent, or are overweight. Talk with your health [name] provider about your target blood pressure readings. Have your blood pressure checked: Every 3-5 years if you are 18-61 years of age. Every year if you are 4 years old or older. If you are between the ages of 25 and 80 and are a current or former smoker, ask your health [name] provider if you should have a one-time screening for abdominal aortic aneurysm (AAA). Diabetes Have regular diabetes screenings. This checks your fasting blood sugar level. Have the screening done: Once every three years after age 57 if you are at a normal weight and have a low risk for diabetes. More often and at a younger age if you are overweight or have a high risk for diabetes. What should I know about preventing infection? Hepatitis B If you have a higher risk for hepatitis B, you should be screened for this virus. Talk with your health [name] provider to find out if you are at risk for hepatitis B infection. Hepatitis C Blood testing is recommended for: Everyone born from [birth year] through 1964. Anyone with known risk factors for hepatitis C. Sexually transmitted infections (STIs) You should be screened each year for STIs, including gonorrhea and chlamydia, if: You are sexually active and are younger than 74 years  of age. You are older than 74 years of age and your health [name] provider tells you that you are at risk for this type of infection. Your sexual activity has changed since you were last screened, and you are at increased risk for chlamydia or gonorrhea. Ask your health [name] provider if you are at risk. Ask your health [name] provider about whether you are at high risk for HIV. Your health [name] provider may recommend a prescription medicine to help prevent HIV infection. If you choose to take medicine to prevent HIV, you should first get tested for HIV.  You should then be tested every 3 months for as long as you are taking the medicine. Follow these instructions at home: Lifestyle Do not use any products that contain nicotine or tobacco, such as cigarettes, e-cigarettes, and chewing tobacco. If you need help quitting, ask your health [name] provider. Do not use street drugs. Do not share needles. Ask your health [name] provider for help if you need support or information about quitting drugs. Alcohol use Do not drink alcohol if your health [name] provider tells you not to drink. If you drink alcohol: Limit how much you have to 0-2 drinks a day. Be aware of how much alcohol is in your drink. In the [place] one drink equals one 12 oz bottle of beer (355 mL), one 5 oz glass of wine (148 mL), or one 1 oz glass of hard liquor (44 mL). General instructions Schedule regular health, dental, and eye exams. Stay current with your vaccines. Tell your health [name] provider if: You often feel depressed. You have ever been abused or do not feel safe at home. Summary Adopting a healthy lifestyle and getting preventive [name] are important in promoting health and wellness. Follow your health [name] provider's instructions about healthy diet, exercising, and getting tested or screened for diseases. Follow your health [name] provider's instructions on monitoring your cholesterol and blood pressure. This information is not intended to replace advice given to you by your health [name] provider. Make sure you discuss any questions you have with your health [name] provider. Document Revised: 03/15/2020 Document Reviewed: 12/29/2017 Elsevier Patient Education  2022 [employer].

## 2020-11-15 NOTE — Progress Notes (Signed)
MEDICARE ANNUAL WELLNESS VISIT  11/15/2020  Telephone Visit Disclaimer This Medicare AWV was conducted by telephone due to national recommendations for restrictions regarding the COVID-19 Pandemic (e.g. social distancing).  I verified, using two identifiers, that I am speaking with Matthew [name] or their authorized healthcare agent. I discussed the limitations, risks, security, and privacy concerns of performing an evaluation and management service by telephone and the potential availability of an in-person appointment in the future. The patient expressed understanding and agreed to proceed.  Location of Patient: Home Location of Provider (nurse):  In the office.  Subjective:    Matthew [name] is a 75 y.o. male patient of Samuel Bouche, NP who had a Medicare Annual Wellness Visit today via telephone. Matthew [name] is Retired and lives alone. he has 0 children. he reports that he is socially active and does interact with friends/family regularly. he is minimally physically active and enjoys spending time outside.  Patient Care Team: Samuel Bouche, NP as PCP - General (Nurse Practitioner) Bernell List, MD as Referring Physician (Internal Medicine)  Advanced Directives 11/15/2020 08/15/2020 06/25/2020 12/20/2019 09/04/2014  Does Patient Have a Medical Advance Directive? Yes No Yes Yes Yes  Type of Advance Directive Living will;Healthcare Power of Attorney - Living will Living will [place]  Does patient want to make changes to medical advance directive? No - Patient declined - No - Patient declined No - Patient declined No - Patient declined  Copy of [place] in Chart? No - copy requested - - - No - copy requested  Would patient like information on creating a medical advance directive? - No - Patient declined - - [facility] Utilization Over the Past 12 Months: # of hospitalizations or ER visits: 6 # of surgeries: 3  Review of Systems    Patient reports that  his overall health is worse compared to last year.  History obtained from chart review and the patient  Patient Reported Readings (BP, Pulse, CBG, Weight, etc) none  Pain Assessment Pain : 0-10 Pain Score: 5  Pain Type: Chronic pain Pain Location: Foot Pain Orientation: Left Pain Onset: More than a month ago Pain Frequency: Constant     Current Medications & Allergies (verified) Allergies as of 11/15/2020   No Known Allergies      Medication List        Accurate as of November 15, 2020  2:42 PM. If you have any questions, ask your nurse or doctor.          aspirin 81 MG tablet Take 81 mg by mouth daily.   Carboxymethylcellulose Sodium 0.25 % Soln Place 1 drop into both eyes 4 (four) times daily as needed (dry, irritated eyes.).   metFORMIN 1000 MG tablet Commonly known as: GLUCOPHAGE Take 1,000 mg by mouth daily with breakfast.   pregabalin 100 MG capsule Commonly known as: LYRICA Take 150 mg by mouth 2 (two) times daily.   silver sulfADIAZINE 1 % cream Commonly known as: Silvadene Apply 1 application topically daily.   SilvrSTAT Wound Dressing Gel Apply 1 application topically daily.   SM BANDAGES FABRIC Misc Apply to wounds as needed.  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   Chicken pox as a child  Contact with powered lawnmower as cause of accidental injury at home as place of occurrence 10/03/2014   Diabetes mellitus without complication ([place])    Fall    every since his stroke   GERD (gastroesophageal reflux disease)    Measles    Other and unspecified hyperlipidemia 05/11/2013   Peripheral neuropathy    Poor dental hygiene 05/11/2013   Shingles 75 yrs old   Sleep apnea    [facility]   [facility]) 2007   Vocal cord dysfunction    Past  Surgical History:  Procedure Laterality Date   APPENDECTOMY  2000   ruptured   CAROTID ENDARTERECTOMY  09/2010   EYE SURGERY  2010   cataracts bilateral   IR RADIOLOGIST EVAL & MGMT  06/08/2018   Family History  Problem Relation Age of Onset   Cancer Mother        kidney   Heart attack Father    Stroke Brother    Heart attack Brother    Stroke Brother    Social History   Socioeconomic History   Marital status: Divorced    Spouse name: Not on file   Number of children: Not on file   Years of education: 14   Highest education level: Some college, no degree  Occupational History   Occupation: Veteran  Tobacco Use   Smoking status: Some Days    Types: Cigars    Start date: 11/27/1997   Smokeless tobacco: Never  Vaping Use   Vaping Use: Never used  Substance and Sexual Activity   Alcohol use: Yes    Alcohol/week: 0.0 standard drinks    Comment: occasionally drinks wine    Drug use: No   Sexual activity: Yes    Partners: Female  Other Topics Concern   Not on file  Social History Narrative   Lives alone. He enjoys sitting outside and enjoys doing work outside.   Social Determinants of Health   Financial Resource Strain: Low Risk    Difficulty of Paying Living Expenses: Not hard at all  Food Insecurity: No Food Insecurity   Worried About Charity fundraiser in the Last Year: Never true   [place] in the Last Year: Never true  Transportation Needs: No Transportation Needs   Lack of Transportation (Medical): No   Lack of Transportation (Non-Medical): No  Physical Activity: Inactive   Days of Exercise per Week: 0 days   Minutes of Exercise per Session: 0 min  Stress: No Stress Concern Present   Feeling of Stress : Not at all  Social Connections: Socially Isolated   Frequency of Communication with Friends and Family: More than three times a week   Frequency of Social Gatherings with Friends and Family: More than three times a week   Attends Religious Services:  Never   Marine scientist or Organizations: No   Attends Archivist Meetings: Never   Marital Status: Divorced    Activities of Daily Living In your present state of health, do you have any difficulty performing the following activities: 11/15/2020  Hearing? N  Comment wears hearing aids bilaterally.  Vision? N  Difficulty concentrating or making decisions? N  Walking or climbing stairs? N  Dressing or bathing? N  Doing errands, shopping? N  Preparing Food and eating ? N  Using the Toilet? N  In the past six months, have you accidently leaked urine? N  Do you have problems with loss of bowel  control? N  Managing your Medications? N  Managing your Finances? N  Housekeeping or managing your Housekeeping? N  Some recent data might be hidden    Patient Education/ Literacy How often do you need to have someone help you when you read instructions, pamphlets, or other written materials from your doctor or pharmacy?: 1 - Never What is the last grade level you completed in school?: Some college  Exercise Current Exercise Habits: The patient does not participate in regular exercise at present, Exercise limited by: orthopedic condition(s)  Diet Patient reports consuming 2 meals a day and 0 snack(s) a day Patient reports that his primary diet is: Regular Patient reports that she does have regular access to food.   Depression Screen PHQ 2/9 Scores 11/15/2020 10/28/2020 06/25/2020 09/04/2014 02/13/2014  PHQ - 2 Score 0 0 1 0 0  PHQ- 9 Score - - 4 - -     Fall Risk Fall Risk  11/15/2020 10/28/2020 06/25/2020 09/04/2014 02/13/2014  Falls in the past year? 1 0 0 No No  Number falls in past yr: 1 0 0 - -  Injury with Fall? 1 0 0 - -  Risk for fall due to : No Fall Risks - Impaired mobility - -  Follow up Falls evaluation completed;Education provided;Falls prevention discussed Falls evaluation completed Falls evaluation completed - -     Objective:  Matthew [name] seemed alert  and oriented and he participated appropriately during our telephone visit.  Blood Pressure Weight BMI  BP Readings from Last 3 Encounters:  11/13/20 123/70  11/04/20 130/70  10/28/20 134/71   Wt Readings from Last 3 Encounters:  11/13/20 225 lb (102.1 kg)  11/04/20 233 lb (105.7 kg)  10/28/20 232 lb (105.2 kg)   BMI Readings from Last 1 Encounters:  11/13/20 28.89 kg/m    *Unable to obtain current vital signs, weight, and BMI due to telephone visit type  Hearing/Vision  Matthew [name] did not seem to have difficulty with hearing/understanding during the telephone conversation Reports that he has had a formal eye exam by an eye care professional within the past year Reports that he has had a formal hearing evaluation within the past year *Unable to fully assess hearing and vision during telephone visit type  Cognitive Function: 6CIT Screen 11/15/2020  What Year? 0 points  What month? 0 points  What time? 0 points  Count back from 20 2 points  Months in reverse 0 points  Repeat phrase 0 points  Total Score 2   (Normal:0-7, Significant for Dysfunction: >8)  Normal Cognitive Function Screening: Yes   Immunization & Health Maintenance Record Immunization History  Administered Date(s) Administered   PFIZER(Purple Top)SARS-COV-2 Vaccination 05/17/2019, 06/07/2019   Pneumococcal Polysaccharide-23 01/19/2009   Pneumococcal-Unspecified 03/09/2003   Tdap 03/20/2006, 09/20/2014, 10/03/2014    Health Maintenance  Topic Date Due   Hepatitis C Screening  Never done   Zoster Vaccines- Shingrix (1 of 2) Never done   Pneumonia Vaccine 67+ Years old (2 - PCV) 01/19/2010   HEMOGLOBIN A1C  01/31/2014   URINE MICROALBUMIN  08/14/2014   FOOT EXAM  01/03/2015   OPHTHALMOLOGY EXAM  04/20/2015   COLONOSCOPY (Pts 45-33yrs Insurance coverage will need to be confirmed)  01/19/2017   COVID-19 Vaccine (3 - Pfizer risk series) 07/05/2019   INFLUENZA VACCINE  04/18/2021 (Originally 08/19/2020)    TETANUS/TDAP  10/02/2024   HPV VACCINES  Aged Out       Assessment  This is a routine wellness examination for  Matthew [name].  Health Maintenance: Due or Overdue Health Maintenance Due  Topic Date Due   Hepatitis C Screening  Never done   Zoster Vaccines- Shingrix (1 of 2) Never done   Pneumonia Vaccine 27+ Years old (2 - PCV) 01/19/2010   HEMOGLOBIN A1C  01/31/2014   URINE MICROALBUMIN  08/14/2014   FOOT EXAM  01/03/2015   OPHTHALMOLOGY EXAM  04/20/2015   COLONOSCOPY (Pts 45-36yrs Insurance coverage will need to be confirmed)  01/19/2017   COVID-19 Vaccine (3 - Pfizer risk series) 07/05/2019    Matthew [name] does not need a referral for Community Assistance: Care Management:   no Social Work:    no Prescription Assistance:  no Nutrition/Diabetes Education:  no   Plan:  Personalized Goals  Goals Addressed               This Visit's Progress     Patient Stated (pt-stated)        11/15/2020 AWV Goal: Exercise for General Health  Patient will verbalize understanding of the benefits of increased physical activity: Exercising regularly is important. It will improve your overall fitness, flexibility, and endurance. Regular exercise also will improve your overall health. It can help you control your weight, reduce stress, and improve your bone density. Over the next year, patient will increase physical activity as tolerated with a goal of at least 150 minutes of moderate physical activity per week.  You can tell that you are exercising at a moderate intensity if your heart starts beating faster and you start breathing faster but can still hold a conversation. Moderate-intensity exercise ideas include: Walking 1 mile (1.6 km) in about 15 minutes Biking Hiking Golfing Dancing Water aerobics Patient will verbalize understanding of everyday activities that increase physical activity by providing examples like the following: Yard work, such as: Arts development officer Gardening Washing windows or floors Patient will be able to explain general safety guidelines for exercising:  Before you start a new exercise program, talk with your health care provider. Do not exercise so much that you hurt yourself, feel dizzy, or get very short of breath. Wear comfortable clothes and wear shoes with good support. Drink plenty of water while you exercise to prevent dehydration or heat stroke. Work out until your breathing and your heartbeat get faster.        Personalized Health Maintenance & Screening Recommendations  Pneumococcal vaccine  Influenza vaccine Colorectal cancer screening Hemoglobin A1C Urine Microalbumin Shingrix vaccine Foot exam-ordered  Patient declined all of the vaccines at this time.  Lung Cancer Screening Recommended: no (Low Dose CT Chest recommended if Age 57-80 years, 30 pack-year currently smoking OR have quit w/in past 15 years) Hepatitis C Screening recommended: yes HIV Screening recommended: yes  Advanced Directives: Written information was not prepared per patient's request.  Referrals & Orders No orders of the defined types were placed in this encounter.   Follow-up Plan Follow-up with Samuel Bouche, NP as planned Have your eye exam records faxed to our office. Medicare wellness visit in one year. AVS printed and mailed to the patient.   I have personally reviewed and noted the following in the patient's chart:   Medical and social history Use of alcohol, tobacco or illicit drugs  Current medications and supplements Functional ability and status Nutritional status Physical activity Advanced directives List of other physicians Hospitalizations, surgeries, and ER visits in previous 12 months Vitals  Screenings to include cognitive, depression, and falls Referrals and appointments  In addition, I have reviewed and discussed with Matthew [name]  certain preventive protocols, quality metrics, and best practice recommendations. A written personalized care plan for preventive services as well as general preventive health recommendations is available and can be mailed to the patient at his request.      Tinnie Gens, RN  11/15/2020

## 2020-11-20 ENCOUNTER — Ambulatory Visit: Payer: Medicare HMO | Admitting: Medical-Surgical

## 2020-11-22 ENCOUNTER — Ambulatory Visit: Payer: Medicare HMO | Admitting: Podiatry

## 2020-12-20 ENCOUNTER — Encounter (HOSPITAL_BASED_OUTPATIENT_CLINIC_OR_DEPARTMENT_OTHER): Payer: Medicare HMO | Admitting: Internal Medicine

## 2021-01-10 ENCOUNTER — Ambulatory Visit: Payer: Medicare HMO | Admitting: Podiatry

## 2021-01-24 DIAGNOSIS — C61 Malignant neoplasm of prostate: Secondary | ICD-10-CM | POA: Diagnosis not present

## 2021-04-08 LAB — BASIC METABOLIC PANEL
BUN: 12 (ref 4–21)
CO2: 24 — AB (ref 13–22)
Chloride: 101 (ref 99–108)
Glucose: 396
Potassium: 4.2 mEq/L (ref 3.5–5.1)
Sodium: 137 (ref 137–147)

## 2021-04-08 LAB — MICROALBUMIN / CREATININE URINE RATIO: Microalb Creat Ratio: 98

## 2021-04-08 LAB — COMPREHENSIVE METABOLIC PANEL
Calcium: 9 (ref 8.7–10.7)
eGFR: 57

## 2021-04-08 LAB — HEMOGLOBIN A1C: Hemoglobin A1C: 8.8

## 2021-04-08 LAB — MICROALBUMIN, URINE: Microalb, Ur: 9.75

## 2021-04-16 ENCOUNTER — Telehealth: Payer: Self-pay | Admitting: Medical-Surgical

## 2021-04-16 DIAGNOSIS — R600 Localized edema: Secondary | ICD-10-CM

## 2021-04-16 DIAGNOSIS — I872 Venous insufficiency (chronic) (peripheral): Secondary | ICD-10-CM

## 2021-04-16 NOTE — Telephone Encounter (Signed)
Patient stopped by the office, he was wanting a referral placed to the vascular surgeon. ?Please Advise. ? ?Vascular Surgeon:  ?Jacqulynn Cadet, MD  ?

## 2021-04-16 NOTE — Telephone Encounter (Signed)
Referral placed. ___________________________________________ Jaedynn Bohlken L. Dailey Alberson, DNP, APRN, FNP-BC Primary Care and Sports Medicine [facility]  

## 2021-04-16 NOTE — Telephone Encounter (Signed)
Patient advised of information bgelow. AM ?

## 2021-04-29 ENCOUNTER — Ambulatory Visit (INDEPENDENT_AMBULATORY_CARE_PROVIDER_SITE_OTHER): Payer: Medicare HMO | Admitting: Medical-Surgical

## 2021-04-29 ENCOUNTER — Encounter: Payer: Self-pay | Admitting: Medical-Surgical

## 2021-04-29 VITALS — BP 134/77 | HR 70 | Ht 74.0 in | Wt 218.0 lb

## 2021-04-29 DIAGNOSIS — I6521 Occlusion and stenosis of right carotid artery: Secondary | ICD-10-CM

## 2021-04-29 NOTE — Progress Notes (Signed)
? ?  Established patient office visit ? ?HPI with pertinent ROS:  ? ?CC: right artery occlusion on [place] ? ?HPI: ?Pleasant 76 year old male presenting today with report from the [place] from an ultrasound completed yesterday.  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  He notes he was supposed to be monitored once yearly bilaterally but he has not had an ultrasound in over 2-1/2 years.  He had 1 yesterday with the results showing right carotid artery occlusion.  He did speak with the [place] regarding this and reports that they were going to place a referral however they wanted to send him to community care and he would probably have a wait of about 3 months.  He is understandably concerned about this wait and is interested in getting referral placed here urgently for surgical intervention.  Notes that he has had temperature changes as well as some paresthesias to the right upper extremity for the past several months but was unsure what may have been causing this.  Now he feels that these symptoms are related to the carotid occlusion. ?Right arm/hand numb, colder than usual, hx of stroke in 2007 ? ?I reviewed the past medical history, family history, social history, surgical history, and allergies today and no changes were needed.  Please see the problem list section below in epic for further details.  ? ?Brief exam, Assessment, and Plan:   ?Today's Vitals: BP 134/77   Pulse 70   Ht '6\' 2"'$  (1.88 m)   Wt 218 lb (98.9 kg)   SpO2 99%   BMI 27.99 kg/m?  ? ?1. Right carotid artery occlusion ?Review of the report shows right carotid artery occlusion.  On exam, he does have slight temperature difference from the right and left upper extremities.  Capillary refill on the right hand is still brisk.  No audible carotid bruits bilaterally.  Urgent referral for vascular surgery entered.  Advised patient to be on the look out for a call to get him scheduled for an evaluation. ?- Ambulatory  referral to Vascular Surgery ? ?35 minutes of face-to-face time was provided during this encounter. ? ?Return if symptoms worsen or fail to improve. ?___________________________________________ ?Clearnce Sorrel, DNP, APRN, FNP-BC ?Primary Care and Sports Medicine ?[facility] ?

## 2021-05-05 DIAGNOSIS — Z8673 Personal history of transient ischemic attack (TIA), and cerebral infarction without residual deficits: Secondary | ICD-10-CM | POA: Diagnosis not present

## 2021-05-05 DIAGNOSIS — I6521 Occlusion and stenosis of right carotid artery: Secondary | ICD-10-CM | POA: Diagnosis not present

## 2021-05-05 DIAGNOSIS — Z8546 Personal history of malignant neoplasm of prostate: Secondary | ICD-10-CM | POA: Diagnosis not present

## 2021-05-05 DIAGNOSIS — Z87891 Personal history of nicotine dependence: Secondary | ICD-10-CM | POA: Diagnosis not present

## 2021-05-05 DIAGNOSIS — Z7982 Long term (current) use of aspirin: Secondary | ICD-10-CM | POA: Diagnosis not present

## 2021-05-05 DIAGNOSIS — E10621 Type 1 diabetes mellitus with foot ulcer: Secondary | ICD-10-CM | POA: Diagnosis not present

## 2021-05-05 DIAGNOSIS — Z79899 Other long term (current) drug therapy: Secondary | ICD-10-CM | POA: Diagnosis not present

## 2021-05-05 DIAGNOSIS — I10 Essential (primary) hypertension: Secondary | ICD-10-CM | POA: Diagnosis not present

## 2021-05-05 DIAGNOSIS — R079 Chest pain, unspecified: Secondary | ICD-10-CM | POA: Diagnosis not present

## 2021-05-05 DIAGNOSIS — L97529 Non-pressure chronic ulcer of other part of left foot with unspecified severity: Secondary | ICD-10-CM | POA: Diagnosis not present

## 2021-05-08 ENCOUNTER — Telehealth: Payer: Self-pay | Admitting: Medical-Surgical

## 2021-05-08 NOTE — Telephone Encounter (Signed)
Mr.Newmark came into office this morning. He is wanting to schedule an appt soon with you to discuss issue from previous visit. There is nothing available for awhile, so we wanted to know if you wanted [place] to work him in. He said that a consult was set up with [facility], but because he didn't want to go with them, they shut the whole thing down. He stated that the [facility] told him he could go with whoever he wanted. I will check with Jenny Reichmann on his referral and where they are at with scheduling him. ?

## 2021-05-15 DIAGNOSIS — I6521 Occlusion and stenosis of right carotid artery: Secondary | ICD-10-CM | POA: Diagnosis not present

## 2021-05-15 DIAGNOSIS — R200 Anesthesia of skin: Secondary | ICD-10-CM | POA: Diagnosis not present

## 2021-05-15 DIAGNOSIS — M549 Dorsalgia, unspecified: Secondary | ICD-10-CM | POA: Diagnosis not present

## 2021-05-15 DIAGNOSIS — I739 Peripheral vascular disease, unspecified: Secondary | ICD-10-CM | POA: Diagnosis not present

## 2021-05-15 DIAGNOSIS — L97522 Non-pressure chronic ulcer of other part of left foot with fat layer exposed: Secondary | ICD-10-CM | POA: Diagnosis not present

## 2021-05-15 DIAGNOSIS — M542 Cervicalgia: Secondary | ICD-10-CM | POA: Diagnosis not present

## 2021-05-26 ENCOUNTER — Other Ambulatory Visit: Payer: Self-pay | Admitting: Medical-Surgical

## 2021-05-26 DIAGNOSIS — I6521 Occlusion and stenosis of right carotid artery: Secondary | ICD-10-CM

## 2021-05-28 ENCOUNTER — Telehealth (INDEPENDENT_AMBULATORY_CARE_PROVIDER_SITE_OTHER): Payer: Self-pay | Admitting: Vascular Surgery

## 2021-05-28 NOTE — Telephone Encounter (Signed)
Spoke with patient in length regarding his referral. He will be calling [facility] and obtain a new prior authorization for AVVS with Dr. Lucky Cowboy. The original was referred to [facility] and pt does not want to see [facility], ONLY Dr. Lucky Cowboy. I advised to find out what type of imaging is on his disc that he has in his possession. Pt acknowledged and will call me when things are authorized to AVVS and I will schedule his appt.  ?

## 2021-05-29 ENCOUNTER — Emergency Department (HOSPITAL_COMMUNITY)
Admission: EM | Admit: 2021-05-29 | Discharge: 2021-05-29 | Disposition: A | Discharge status: Home / Self Care | Payer: Medicare HMO | Attending: Emergency Medicine | Admitting: Emergency Medicine

## 2021-05-29 DIAGNOSIS — Z7982 Long term (current) use of aspirin: Secondary | ICD-10-CM | POA: Insufficient documentation

## 2021-05-29 DIAGNOSIS — R45851 Suicidal ideations: Secondary | ICD-10-CM | POA: Diagnosis not present

## 2021-05-29 DIAGNOSIS — Z711 Person with feared health complaint in whom no diagnosis is made: Secondary | ICD-10-CM | POA: Insufficient documentation

## 2021-05-29 NOTE — ED Triage Notes (Signed)
At triage, pt reports being frustrated with prior [facility] md, traffic getting here etc. Was trying to visit his friend inpatient and got frustrated. Was brought to the ED due to having suicidal comments at [facility]. Pt states he was "running his mouth" and is not suicidal or homicidal, he only wants to visit his friend.  ?

## 2021-05-29 NOTE — ED Provider Notes (Signed)
?[facility] ?Provider Note ? ? ?CSN: [id number] ?Arrival date & time: 05/29/21  1105 ? ?  ? ?History ? ?No chief complaint on file. ? ? ?Matthew [name] is a 76 y.o. male. ? ?The history is provided by the patient and medical records. No language interpreter was used.  ? ?This is a 76 year old male initially sent to the ER because staff reported he mention he was having suicidal thoughts.  Upon further questioning, patient states that he was trying to visit his girlfriend who is currently hospitalized in the hospital.  He had a tough day trying to get to the hospital due to traffic.  Upon entering the hospital, he was unable to see his girlfriend because her legal name is not the same name that she normally use therefore there was unable to locate her.  There was frustration and there was report that he was suicidal.  Patient denies having any suicidal or homicidal ideation.  He reported "I love myself".  He voiced frustration that the staff was unable to locate his girlfriend even though he has all the appropriate information.  He does not want to be a patient.  He mentioned that he does have history of carotid artery stenosis but does not voice any new complaint.  Patient requests to be discharged to see his girlfriend. ? ?Home Medications ?Prior to Admission medications   ?Medication Sig Start Date End Date Taking? Authorizing Provider  ?Adhesive Bandages (SM BANDAGES FABRIC) MISC Apply to wounds as needed.  1 3/4"x4" bandage Sunmark Brand by [employer] ?Patient taking differently: Apply to wounds as needed.  1 3/4"x4" bandage [employer] Brand by [facility] 07/05/20   Luetta Nutting, DO  ?aspirin 81 MG tablet Take 81 mg by mouth daily.    [provider]  ?Carboxymethylcellulose Sodium 0.25 % SOLN Place 1 drop into both eyes 4 (four) times daily as needed (dry, irritated eyes.). 08/12/20   [provider]  ?metFORMIN (GLUCOPHAGE) 1000 MG tablet Take 1,000 mg by mouth  daily with breakfast.    [provider]  ?Silver (SILVRSTAT WOUND DRESSING) GEL Apply 1 application topically daily. 05/06/18   Silverio Decamp, MD  ?silver sulfADIAZINE (SILVADENE) 1 % cream Apply 1 application topically daily. 08/27/20   Samuel Bouche, NP  ?   ? ?Allergies    ?Patient has no known allergies.   ? ?Review of Systems   ?Review of Systems  ?All other systems reviewed and are negative. ? ?Physical Exam ?Updated Vital Signs ?BP 138/87 (BP Location: Left Arm)   Pulse 69   Temp 98.8 ?F (37.1 ?C) (Oral)   Resp 16   SpO2 100%  ?Physical Exam ?Vitals and nursing note reviewed.  ?Constitutional:   ?   General: He is not in acute distress. ?   Appearance: He is well-developed.  ?HENT:  ?   Head: Atraumatic.  ?Eyes:  ?   Conjunctiva/sclera: Conjunctivae normal.  ?Musculoskeletal:  ?   Cervical back: Neck supple.  ?Skin: ?   Findings: No rash.  ?Neurological:  ?   Mental Status: He is alert.  ?   GCS: GCS eye subscore is 4. GCS verbal subscore is 5. GCS motor subscore is 6.  ?Psychiatric:     ?   Attention and Perception: Attention normal.     ?   Mood and Affect: Mood normal.     ?   Speech: Speech normal.     ?   Behavior: Behavior is cooperative.     ?  Thought Content: Thought content is not paranoid. Thought content does not include homicidal or suicidal ideation.  ? ? ?ED Results / Procedures / Treatments   ?Labs ?(all labs ordered are listed, but only abnormal results are displayed) ?Labs Reviewed - No data to display ? ?EKG ?None ? ?Radiology ?No results found. ? ?Procedures ?Procedures  ? ? ?Medications Ordered in ED ?Medications - No data to display ? ?ED Course/ Medical Decision Making/ A&P ?  ?                        ?Medical Decision Making ? ?BP 138/87 (BP Location: Left Arm)   Pulse 69   Temp 98.8 ?F (37.1 ?C) (Oral)   Resp 16   SpO2 100%  ? ?11:38 AM ?Patient initially sent to the ER due to having suicidal thoughts.  Upon further questioning patient denies SI HI but did voice  frustration that he was having difficulty with traffic jam trying to drive to the hospital to visit his girlfriend who is currently a patient up on the fifth floor.  He gave the staff at the desk the name that he normally call his girlfriend but that was not the same legal name and that she had registered in the system.  That is further increase his frustration.  However patient adamantly denies SI HI.  I have reviewed patient's prior notes and considered in my plan of care.  Patient without any prior psychiatric history or history of SI or HI.  He is alert and oriented x3 answer questions appropriately and make informed decision.  I do not have any concern that he is having homicidal or suicidal thoughts.  I do believe he was frustrated of the situation but he is stable to be discharged. ? ?Fall as initial complaint of possible neck blockage, patient does have known history of carotid artery stenosis and is being cared for through.  Hospital.  He is without any new complaint and therefore no additional work-up indicated during this visit. ? ? ? ? ? ? ? ?Final Clinical Impression(s) / ED Diagnoses ?Final diagnoses:  ?Worried well  ? ? ?Rx / DC Orders ?ED Discharge Orders   ? ? None  ? ?  ? ? ?  ?Domenic Moras, PA-C ?05/29/21 1142 ? ?  ?Charlesetta Shanks, MD ?05/30/21 1308 ? ?

## 2021-06-13 ENCOUNTER — Encounter (INDEPENDENT_AMBULATORY_CARE_PROVIDER_SITE_OTHER): Payer: Self-pay | Admitting: Nurse Practitioner

## 2021-06-13 ENCOUNTER — Ambulatory Visit (INDEPENDENT_AMBULATORY_CARE_PROVIDER_SITE_OTHER): Payer: Medicare HMO | Admitting: Nurse Practitioner

## 2021-06-13 VITALS — BP 117/64 | HR 83 | Resp 16 | Wt 219.0 lb

## 2021-06-13 DIAGNOSIS — L97529 Non-pressure chronic ulcer of other part of left foot with unspecified severity: Secondary | ICD-10-CM

## 2021-06-13 DIAGNOSIS — L97409 Non-pressure chronic ulcer of unspecified heel and midfoot with unspecified severity: Secondary | ICD-10-CM | POA: Diagnosis not present

## 2021-06-13 DIAGNOSIS — E11621 Type 2 diabetes mellitus with foot ulcer: Secondary | ICD-10-CM

## 2021-06-13 DIAGNOSIS — Z741 Need for assistance with personal care: Secondary | ICD-10-CM | POA: Insufficient documentation

## 2021-06-13 DIAGNOSIS — I6523 Occlusion and stenosis of bilateral carotid arteries: Secondary | ICD-10-CM

## 2021-06-23 ENCOUNTER — Encounter (INDEPENDENT_AMBULATORY_CARE_PROVIDER_SITE_OTHER): Payer: Self-pay | Admitting: Nurse Practitioner

## 2021-06-23 NOTE — Progress Notes (Signed)
Subjective:    Patient ID: Matthew [name], male    DOB: 09-17-1945, 76 y.o.   MRN: [id number] Chief Complaint  Patient presents with   New Patient (Initial Visit)    Ref Charna Archer consult for right carotid artery occulsion    Matthew [name] is a 76 year old gentleman with a previous history of diabetes mellitus, hypertension, stroke, diabetic foot wound, neuropathy and previous left carotid endarterectomy.  The patient underwent a carotid ultrasound done at the [place] which showed possible occlusion of his right ICA.  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  Left internal carotid artery is widely patent.  He has patent bilateral vertebral arteries.  He had a carotid endarterectomy in 2012.  Prior to this intervention he had a stroke in 2007.  He also currently has a wound on his left foot that has been there for several years.  The patient showed me previous pictures and it does appear to be healing but still very slowly.  Previous records show that he underwent a angioplasty of the left peroneal artery approximately a year ago.   Review of Systems  Skin:  Positive for wound.  All other systems reviewed and are negative.     Objective:   Physical Exam Vitals reviewed.  HENT:     Head: Normocephalic.  Cardiovascular:     Rate and Rhythm: Normal rate.     Pulses: Decreased pulses.  Skin:    General: Skin is warm and dry.  Neurological:     Mental Status: He is alert and oriented to person, place, and time.     Gait: Gait abnormal.  Psychiatric:        Mood and Affect: Mood normal.        Behavior: Behavior normal.        Thought Content: Thought content normal.        Judgment: Judgment normal.    BP 117/64 (BP Location: Left Arm)   Pulse 83   Resp 16   Wt 219 lb (99.3 kg)   BMI 28.12 kg/m   Past Medical History:  Diagnosis Date   Acute upper respiratory infection  02/18/2014   Allergy    Arthritis    Carotid artery disease ([place]) 05/11/2013   Chicken pox as a child   Contact with powered lawnmower as cause of accidental injury at home as place of occurrence 10/03/2014   Diabetes mellitus without complication ([place])    Fall    every since his stroke   GERD (gastroesophageal reflux disease)    Measles    Other and unspecified hyperlipidemia 05/11/2013   Peripheral neuropathy    Poor dental hygiene 05/11/2013   Shingles 76 yrs old   Sleep apnea    [facility]   Stroke [facility]) 2007   Vocal cord dysfunction     Social History   Socioeconomic History   Marital status: Divorced    Spouse name: Not on file   Number of children: Not on file   Years of education: 14   Highest education level: Some college, no degree  Occupational History   Occupation: Veteran  Tobacco Use   Smoking status: Some Days    Types: Cigars    Start date: 11/27/1997   Smokeless tobacco: Never  Vaping Use   Vaping Use: Never used  Substance and Sexual Activity   Alcohol use: Yes  Alcohol/week: 0.0 standard drinks    Comment: occasionally drinks wine    Drug use: No   Sexual activity: Yes    Partners: Female  Other Topics Concern   Not on file  Social History Narrative   Lives alone. He enjoys sitting outside and enjoys doing work outside.   Social Determinants of Health   Financial Resource Strain: Low Risk    Difficulty of Paying Living Expenses: Not hard at all  Food Insecurity: No Food Insecurity   Worried About Charity fundraiser in the Last Year: Never true   [place] in the Last Year: Never true  Transportation Needs: No Transportation Needs   Lack of Transportation (Medical): No   Lack of Transportation (Non-Medical): No  Physical Activity: Inactive   Days of Exercise per Week: 0 days   Minutes of Exercise per Session: 0 min  Stress: No Stress Concern Present   Feeling of Stress : Not at all  Social Connections: Socially  Isolated   Frequency of Communication with Friends and Family: More than three times a week   Frequency of Social Gatherings with Friends and Family: More than three times a week   Attends Religious Services: Never   Marine scientist or Organizations: No   Attends Music therapist: Never   Marital Status: Divorced  Human resources officer Violence: Not At Risk   Fear of Current or Ex-Partner: No   Emotionally Abused: No   Physically Abused: No   Sexually Abused: No    Past Surgical History:  Procedure Laterality Date   APPENDECTOMY  2000   ruptured   CAROTID ENDARTERECTOMY  09/2010   EYE SURGERY  2010   cataracts bilateral   IR RADIOLOGIST EVAL & MGMT  06/08/2018    Family History  Problem Relation Age of Onset   Cancer Mother        kidney   Heart attack Father    Stroke Brother    Heart attack Brother    Stroke Brother     Allergies  Allergen Reactions   Aspirin-Dipyridamole Er Nausea And Vomiting    Other reaction(s): Swelling of upper limb, Weakness present       Latest Ref Rng & Units 10/02/2020    2:03 PM 08/15/2020   11:36 AM 02/28/2014   11:05 AM  CBC  WBC 3.8 - 10.8 Thousand/uL 9.0   10.4   7.6    Hemoglobin 13.2 - 17.1 g/dL 14.2   13.7   15.8    Hematocrit 38.5 - 50.0 % 43.2   42.4   45.5    Platelets 140 - 400 Thousand/uL 375   311   211.0        CMP     Component Value Date/Time   NA 137 04/08/2021 0000   NA 135 (L) 09/29/2013 0419   K 4.2 04/08/2021 0000   K 4.8 09/29/2013 0419   CL 101 04/08/2021 0000   CL 103 09/29/2013 0419   CO2 24 (A) 04/08/2021 0000   CO2 25 09/29/2013 0419   GLUCOSE 225 (H) 10/02/2020 1403   GLUCOSE 216 (H) 09/29/2013 0419   BUN 12 04/08/2021 0000   BUN 16 09/29/2013 0419   CREATININE 1.39 (H) 10/02/2020 1403   CALCIUM 9.0 04/08/2021 0000   CALCIUM 8.2 (L) 09/29/2013 0419   PROT 8.2 (H) 10/02/2020 1403   PROT 7.4 01/23/2020 0957   ALBUMIN 4.0 08/15/2020 1136   AST 17 10/02/2020 1403  ALT 12  10/02/2020 1403   ALKPHOS 59 08/15/2020 1136   BILITOT 0.8 10/02/2020 1403   GFRNONAA >60 08/15/2020 1136   GFRNONAA >60 09/29/2013 0419   GFRAA >60 09/29/2013 0419     No results found.     Assessment & Plan:   1. Bilateral carotid artery stenosis I discussed with patient that currently there is no role for intervention with right carotid artery occlusion.  We discussed that once the carotid artery is completely occluded, position is contraindicated due to severe risk of stroke.  The the patient's left ICA is widely patent following carotid endarterectomy in 2012.  The patient also has widely patent vertebral arteries as well.  Based on the patient's left ICA as well as vertebrals, these provide appropriate collateral circulation.  The patient is currently on appropriate medical therapy including aspirin and statin.  We will have the patient return in 6 months to repeat noninvasive studies.  2. Plantar ulcer of left foot, unspecified ulcer stage (HCC) Given the patient's history of carotid artery disease as well as risk factors for PAD we will have him return for noninvasive studies to evaluate for possible significant PAD that is slowly removed healing.  3. 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  Hgb A1C to be monitored as already arranged by primary service    Current Outpatient Medications on File Prior to Visit  Medication Sig Dispense Refill   Adhesive Bandages (SM BANDAGES FABRIC) MISC Apply to wounds as needed.  1 3/4"x4" bandage [employer] by [employer] (Patient taking differently: Apply to wounds as needed.  1 3/4"x4" bandage [employer] by [employer]) 70 each 3   aspirin 81 MG tablet Take 81 mg by mouth daily.     Carboxymethylcellulose Sodium 0.25 % SOLN Place 1 drop into both eyes 4 (four) times daily as needed (dry, irritated  eyes.).     insulin glargine (LANTUS) 100 UNIT/ML injection Inject 60-80 Units into the skin daily.     Silver (SILVRSTAT WOUND DRESSING) GEL Apply 1 application topically daily. 28.35 g 11   silver sulfADIAZINE (SILVADENE) 1 % cream Apply 1 application topically daily. 400 g 0   metFORMIN (GLUCOPHAGE) 1000 MG tablet Take 1,000 mg by mouth daily with breakfast. (Patient not taking: Reported on 06/13/2021)     No current facility-administered medications on file prior to visit.    There are no Patient Instructions on file for this visit. No follow-ups on file.   Kris Hartmann, NP

## 2021-08-18 ENCOUNTER — Other Ambulatory Visit (INDEPENDENT_AMBULATORY_CARE_PROVIDER_SITE_OTHER): Payer: Self-pay | Admitting: Nurse Practitioner

## 2021-08-18 DIAGNOSIS — L97529 Non-pressure chronic ulcer of other part of left foot with unspecified severity: Secondary | ICD-10-CM

## 2021-08-19 ENCOUNTER — Ambulatory Visit (INDEPENDENT_AMBULATORY_CARE_PROVIDER_SITE_OTHER): Payer: Medicare HMO

## 2021-08-19 ENCOUNTER — Ambulatory Visit (INDEPENDENT_AMBULATORY_CARE_PROVIDER_SITE_OTHER): Payer: Medicare HMO | Admitting: Vascular Surgery

## 2021-08-19 ENCOUNTER — Encounter (INDEPENDENT_AMBULATORY_CARE_PROVIDER_SITE_OTHER): Payer: Self-pay | Admitting: Vascular Surgery

## 2021-08-19 VITALS — BP 108/63 | HR 78 | Resp 16 | Wt 213.4 lb

## 2021-08-19 DIAGNOSIS — L97409 Non-pressure chronic ulcer of unspecified heel and midfoot with unspecified severity: Secondary | ICD-10-CM | POA: Diagnosis not present

## 2021-08-19 DIAGNOSIS — L97529 Non-pressure chronic ulcer of other part of left foot with unspecified severity: Secondary | ICD-10-CM | POA: Diagnosis not present

## 2021-08-19 DIAGNOSIS — I10 Essential (primary) hypertension: Secondary | ICD-10-CM | POA: Diagnosis not present

## 2021-08-19 DIAGNOSIS — I6523 Occlusion and stenosis of bilateral carotid arteries: Secondary | ICD-10-CM

## 2021-08-19 DIAGNOSIS — I7025 Atherosclerosis of native arteries of other extremities with ulceration: Secondary | ICD-10-CM

## 2021-08-19 DIAGNOSIS — E11621 Type 2 diabetes mellitus with foot ulcer: Secondary | ICD-10-CM | POA: Diagnosis not present

## 2021-08-19 DIAGNOSIS — E782 Mixed hyperlipidemia: Secondary | ICD-10-CM | POA: Diagnosis not present

## 2021-08-19 NOTE — Assessment & Plan Note (Signed)
Previous left carotid endarterectomy and the patient reports a right carotid occlusion that was not present many years ago.  His carotids be checked with duplex at least annually.

## 2021-08-19 NOTE — Assessment & Plan Note (Signed)
blood glucose control important in reducing the progression of atherosclerotic disease. Also, involved in wound healing. On appropriate medications.  

## 2021-08-19 NOTE — Assessment & Plan Note (Signed)
To evaluate his lower extremity perfusion for healing, ABIs were done today.  His right ABI was 0.99 with multiphasic waveforms.  His left ABI was 0.41 with monophasic waveforms.  Duplex suggested largely tibial disease in the left lower extremity. Given this poor perfusion, I can understand why his wound has not healed in years.  He has done a good job keeping it clean and keeping it from getting infected, but I would recommend angiogram with an attempted revascularization to see if I can improve his perfusion.  Improve perfusion would likely expedite wound healing significantly.  Patient voices understanding and wants to go home and look at his schedule and plan his procedure.

## 2021-08-19 NOTE — H&P (View-Only) (Signed)
MRN : [id number]  Matthew [name] is a 76 y.o. (01-Sep-1945) male who presents with chief complaint of  Chief Complaint  Patient presents with   Follow-up    Ultrasound follow up  .  History of Present Illness: Patient returns today in follow up of his nonhealing ulceration in the left foot with PAD.  He had a previous angiogram with what sounds like some degree of revascularization at [facility] a few years ago.  He has been dealing with this chronic ulceration for what sounds like years now.  I previously performed a carotid endarterectomy on his left side back in 2015.  No recent focal neurologic symptoms. Specifically, the patient denies amaurosis fugax, speech or swallowing difficulties, or arm or leg weakness or numbness To evaluate his lower extremity perfusion for healing, ABIs were done today.  His right ABI was 0.99 with multiphasic waveforms.  His left ABI was 0.41 with monophasic waveforms.  Duplex suggested largely tibial disease in the left lower extremity.  Current Outpatient Medications  Medication Sig Dispense Refill   Adhesive Bandages (SM BANDAGES FABRIC) MISC Apply to wounds as needed.  1 3/4"x4" bandage [employer] Brand by [employer] (Patient taking differently: Apply to wounds as needed.  1 3/4"x4" bandage [employer] by [employer]) 70 each 3   aspirin 81 MG tablet Take 81 mg by mouth daily.     Carboxymethylcellulose Sodium 0.25 % SOLN Place 1 drop into both eyes 4 (four) times daily as needed (dry, irritated eyes.).     insulin glargine (LANTUS) 100 UNIT/ML injection Inject 60-80 Units into the skin daily.     Silver (SILVRSTAT WOUND DRESSING) GEL Apply 1 application topically daily. 28.35 g 11   silver sulfADIAZINE (SILVADENE) 1 % cream Apply 1 application topically daily. 400 g 0   metFORMIN (GLUCOPHAGE) 1000 MG tablet Take 1,000 mg by mouth daily with breakfast. (Patient not taking: Reported on 06/13/2021)     No current facility-administered medications for this visit.     Past Medical History:  Diagnosis Date   Acute upper respiratory infection 02/18/2014   Allergy    Arthritis    Carotid artery disease ([place]) 05/11/2013   Chicken pox as a child   Contact with powered lawnmower as cause of accidental injury at home as place of occurrence 10/03/2014   Diabetes mellitus without complication ([place])    Fall    every since his stroke   GERD (gastroesophageal reflux disease)    Measles    Other and unspecified hyperlipidemia 05/11/2013   Peripheral neuropathy    Poor dental hygiene 05/11/2013   Shingles 76 yrs old   Sleep apnea    uses CPAP, managed by [facility]   Stroke [facility]) 2007   Vocal cord dysfunction     Past Surgical History:  Procedure Laterality Date   APPENDECTOMY  2000   ruptured   CAROTID ENDARTERECTOMY  09/2010   EYE SURGERY  2010   cataracts bilateral   IR RADIOLOGIST EVAL & MGMT  06/08/2018     Social History   Tobacco Use   Smoking status: Some Days    Types: Cigars    Start date: 11/27/1997   Smokeless tobacco: Never  Vaping Use   Vaping Use: Never used  Substance Use Topics   Alcohol use: Yes    Alcohol/week: 0.0 standard drinks of alcohol    Comment: occasionally drinks wine    Drug use: No       Family History  Problem Relation Age  of Onset   Cancer Mother        kidney   Heart attack Father    Stroke Brother    Heart attack Brother    Stroke Brother      Allergies  Allergen Reactions   Aspirin-Dipyridamole Er Nausea And Vomiting    Other reaction(s): Swelling of upper limb, Weakness present Other reaction(s): Swelling of upper limb, Weakness present     REVIEW OF SYSTEMS (Negative unless checked)  Constitutional: '[]'$ Weight loss  '[]'$ Fever  '[]'$ Chills Cardiac: '[]'$ Chest pain   '[]'$ Chest pressure   '[]'$ Palpitations   '[]'$ Shortness of breath when laying flat   '[]'$ Shortness of breath at rest   '[]'$ Shortness of breath with exertion. Vascular:  '[]'$ Pain in legs with walking   '[]'$ Pain in legs at rest   '[]'$ Pain in legs when laying  flat   '[]'$ Claudication   '[]'$ Pain in feet when walking  '[]'$ Pain in feet at rest  '[]'$ Pain in feet when laying flat   '[]'$ History of DVT   '[]'$ Phlebitis   '[]'$ Swelling in legs   '[]'$ Varicose veins   '[x]'$ Non-healing ulcers Pulmonary:   '[]'$ Uses home oxygen   '[]'$ Productive cough   '[]'$ Hemoptysis   '[]'$ Wheeze  '[]'$ COPD   '[]'$ Asthma Neurologic:  '[]'$ Dizziness  '[]'$ Blackouts   '[]'$ Seizures   '[]'$ History of stroke   '[]'$ History of TIA  '[]'$ Aphasia   '[]'$ Temporary blindness   '[]'$ Dysphagia   '[]'$ Weakness or numbness in arms   '[]'$ Weakness or numbness in legs Musculoskeletal:  '[]'$ Arthritis   '[]'$ Joint swelling   '[]'$ Joint pain   '[]'$ Low back pain Hematologic:  '[]'$ Easy bruising  '[]'$ Easy bleeding   '[]'$ Hypercoagulable state   '[]'$ Anemic   Gastrointestinal:  '[]'$ Blood in stool   '[]'$ Vomiting blood  '[]'$ Gastroesophageal reflux/heartburn   '[]'$ Abdominal pain Genitourinary:  '[]'$ Chronic kidney disease   '[]'$ Difficult urination  '[]'$ Frequent urination  '[]'$ Burning with urination   '[]'$ Hematuria Skin:  '[]'$ Rashes   '[x]'$ Ulcers   '[x]'$ Wounds Psychological:  '[]'$ History of anxiety   '[]'$  History of major depression.  Physical Examination  BP 108/63 (BP Location: Right Arm)   Pulse 78   Resp 16   Wt 213 lb 6.4 oz (96.8 kg)   BMI 27.40 kg/m  Gen:  WD/WN, NAD Head: [place]/AT, No temporalis wasting. Ear/Nose/Throat: Hearing grossly intact, nares w/o erythema or drainage Eyes: Conjunctiva clear. Sclera non-icteric Neck: Supple.  Trachea midline Pulmonary:  Good air movement, no use of accessory muscles.  Cardiac: RRR, no JVD Vascular:  Vessel Right Left  Radial Palpable Palpable                          PT Palpable 1+ Palpable  DP Palpable Not Palpable   Gastrointestinal: soft, non-tender/non-distended. No guarding/reflex.  Musculoskeletal: M/S 5/5 throughout.  No deformity or atrophy.  Stasis changes are present bilaterally.  Left foot wound is dressed.  No edema. Neurologic: Sensation grossly intact in extremities.  Symmetrical.  Speech is fluent.  Psychiatric: Judgment intact, Mood &  affect appropriate for pt's clinical situation. Dermatologic: Left foot wound is currently dressed.      Labs No results found for this or any previous visit (from the past 2160 hour(s)).  Radiology No results found.  Assessment/Plan  Essential hypertension with goal blood pressure less than 140/90 blood pressure control important in reducing the progression of atherosclerotic disease. On appropriate oral medications.   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 Also, involved in wound healing. On  appropriate medications.   Hyperlipidemia, mixed lipid control important in reducing the progression of atherosclerotic disease. Continue statin therapy   Atherosclerosis of native arteries of the extremities with ulceration ([place]) To evaluate his lower extremity perfusion for healing, ABIs were done today.  His right ABI was 0.99 with multiphasic waveforms.  His left ABI was 0.41 with monophasic waveforms.  Duplex suggested largely tibial disease in the left lower extremity. Given this poor perfusion, I can understand why his wound has not healed in years.  He has done a good job keeping it clean and keeping it from getting infected, but I would recommend angiogram with an attempted revascularization to see if I can improve his perfusion.  Improve perfusion would likely expedite wound healing significantly.  Patient voices understanding and wants to go home and look at his schedule and plan his procedure.  Carotid artery disease Previous left carotid endarterectomy and the patient reports a right carotid occlusion that was not present many years ago.  His carotids be checked with duplex at least annually.    [name], MD  08/19/2021 2:41 PM    This note was created with Dragon medical transcription system.  Any errors from dictation are purely unintentional

## 2021-08-19 NOTE — Progress Notes (Signed)
MRN : [id number]  Matthew [name] is a 76 y.o. ([birth year]) male who presents with chief complaint of  Chief Complaint  Patient presents with   Follow-up    Ultrasound follow up  .  History of Present Illness: Patient returns today in follow up of his nonhealing ulceration in the left foot with PAD.  He had a previous angiogram with what sounds like some degree of revascularization at [facility] a few years ago.  He has been dealing with this chronic ulceration for what sounds like years now.  I previously performed a carotid endarterectomy on his left side back in 2015.  No recent focal neurologic symptoms. Specifically, the patient denies amaurosis fugax, speech or swallowing difficulties, or arm or leg weakness or numbness To evaluate his lower extremity perfusion for healing, ABIs were done today.  His right ABI was 0.99 with multiphasic waveforms.  His left ABI was 0.41 with monophasic waveforms.  Duplex suggested largely tibial disease in the left lower extremity.  Current Outpatient Medications  Medication Sig Dispense Refill   Adhesive Bandages (SM BANDAGES FABRIC) MISC Apply to wounds as needed.  1 3/4"x4" bandage [employer] by [employer] (Patient taking differently: Apply to wounds as needed.  1 3/4"x4" bandage [employer] by [employer]) 70 each 3   aspirin 81 MG tablet Take 81 mg by mouth daily.     Carboxymethylcellulose Sodium 0.25 % SOLN Place 1 drop into both eyes 4 (four) times daily as needed (dry, irritated eyes.).     insulin glargine (LANTUS) 100 UNIT/ML injection Inject 60-80 Units into the skin daily.     Silver (SILVRSTAT WOUND DRESSING) GEL Apply 1 application topically daily. 28.35 g 11   silver sulfADIAZINE (SILVADENE) 1 % cream Apply 1 application topically daily. 400 g 0   metFORMIN (GLUCOPHAGE) 1000 MG tablet Take 1,000 mg by mouth daily with breakfast. (Patient not taking: Reported on 06/13/2021)     No current facility-administered medications for this visit.     Past Medical History:  Diagnosis Date   Acute upper respiratory infection 02/18/2014   Allergy    Arthritis    Carotid artery disease ([place]) 05/11/2013   Chicken pox as a child   Contact with powered lawnmower as cause of accidental injury at home as place of occurrence 10/03/2014   Diabetes mellitus without complication ([place])    Fall    every since his stroke   GERD (gastroesophageal reflux disease)    Measles    Other and unspecified hyperlipidemia 05/11/2013   Peripheral neuropathy    Poor dental hygiene 05/11/2013   Shingles 76 yrs old   Sleep apnea    uses CPAP, managed by [facility]   Stroke [facility]) 2007   Vocal cord dysfunction     Past Surgical History:  Procedure Laterality Date   APPENDECTOMY  2000   ruptured   CAROTID ENDARTERECTOMY  09/2010   EYE SURGERY  2010   cataracts bilateral   IR RADIOLOGIST EVAL & MGMT  06/08/2018     Social History   Tobacco Use   Smoking status: Some Days    Types: Cigars    Start date: 11/27/1997   Smokeless tobacco: Never  Vaping Use   Vaping Use: Never used  Substance Use Topics   Alcohol use: Yes    Alcohol/week: 0.0 standard drinks of alcohol    Comment: occasionally drinks wine    Drug use: No       Family History  Problem Relation Age  of Onset   Cancer Mother        kidney   Heart attack Father    Stroke Brother    Heart attack Brother    Stroke Brother      Allergies  Allergen Reactions   Aspirin-Dipyridamole Er Nausea And Vomiting    Other reaction(s): Swelling of upper limb, Weakness present Other reaction(s): Swelling of upper limb, Weakness present     REVIEW OF SYSTEMS (Negative unless checked)  Constitutional: '[]'$ Weight loss  '[]'$ Fever  '[]'$ Chills Cardiac: '[]'$ Chest pain   '[]'$ Chest pressure   '[]'$ Palpitations   '[]'$ Shortness of breath when laying flat   '[]'$ Shortness of breath at rest   '[]'$ Shortness of breath with exertion. Vascular:  '[]'$ Pain in legs with walking   '[]'$ Pain in legs at rest   '[]'$ Pain in legs when laying  flat   '[]'$ Claudication   '[]'$ Pain in feet when walking  '[]'$ Pain in feet at rest  '[]'$ Pain in feet when laying flat   '[]'$ History of DVT   '[]'$ Phlebitis   '[]'$ Swelling in legs   '[]'$ Varicose veins   '[x]'$ Non-healing ulcers Pulmonary:   '[]'$ Uses home oxygen   '[]'$ Productive cough   '[]'$ Hemoptysis   '[]'$ Wheeze  '[]'$ COPD   '[]'$ Asthma Neurologic:  '[]'$ Dizziness  '[]'$ Blackouts   '[]'$ Seizures   '[]'$ History of stroke   '[]'$ History of TIA  '[]'$ Aphasia   '[]'$ Temporary blindness   '[]'$ Dysphagia   '[]'$ Weakness or numbness in arms   '[]'$ Weakness or numbness in legs Musculoskeletal:  '[]'$ Arthritis   '[]'$ Joint swelling   '[]'$ Joint pain   '[]'$ Low back pain Hematologic:  '[]'$ Easy bruising  '[]'$ Easy bleeding   '[]'$ Hypercoagulable state   '[]'$ Anemic   Gastrointestinal:  '[]'$ Blood in stool   '[]'$ Vomiting blood  '[]'$ Gastroesophageal reflux/heartburn   '[]'$ Abdominal pain Genitourinary:  '[]'$ Chronic kidney disease   '[]'$ Difficult urination  '[]'$ Frequent urination  '[]'$ Burning with urination   '[]'$ Hematuria Skin:  '[]'$ Rashes   '[x]'$ Ulcers   '[x]'$ Wounds Psychological:  '[]'$ History of anxiety   '[]'$  History of major depression.  Physical Examination  BP 108/63 (BP Location: Right Arm)   Pulse 78   Resp 16   Wt 213 lb 6.4 oz (96.8 kg)   BMI 27.40 kg/m  Gen:  WD/WN, NAD Head: [place]/AT, No temporalis wasting. Ear/Nose/Throat: Hearing grossly intact, nares w/o erythema or drainage Eyes: Conjunctiva clear. Sclera non-icteric Neck: Supple.  Trachea midline Pulmonary:  Good air movement, no use of accessory muscles.  Cardiac: RRR, no JVD Vascular:  Vessel Right Left  Radial Palpable Palpable                          PT Palpable 1+ Palpable  DP Palpable Not Palpable   Gastrointestinal: soft, non-tender/non-distended. No guarding/reflex.  Musculoskeletal: M/S 5/5 throughout.  No deformity or atrophy.  Stasis changes are present bilaterally.  Left foot wound is dressed.  No edema. Neurologic: Sensation grossly intact in extremities.  Symmetrical.  Speech is fluent.  Psychiatric: Judgment intact, Mood &  affect appropriate for pt's clinical situation. Dermatologic: Left foot wound is currently dressed.      Labs No results found for this or any previous visit (from the past 2160 hour(s)).  Radiology No results found.  Assessment/Plan  Essential hypertension with goal blood pressure less than 140/90 blood pressure control important in reducing the progression of atherosclerotic disease. On appropriate oral medications.   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 Also, involved in wound healing. On  appropriate medications.   Hyperlipidemia, mixed lipid control important in reducing the progression of atherosclerotic disease. Continue statin therapy   Atherosclerosis of native arteries of the extremities with ulceration ([place]) To evaluate his lower extremity perfusion for healing, ABIs were done today.  His right ABI was 0.99 with multiphasic waveforms.  His left ABI was 0.41 with monophasic waveforms.  Duplex suggested largely tibial disease in the left lower extremity. Given this poor perfusion, I can understand why his wound has not healed in years.  He has done a good job keeping it clean and keeping it from getting infected, but I would recommend angiogram with an attempted revascularization to see if I can improve his perfusion.  Improve perfusion would likely expedite wound healing significantly.  Patient voices understanding and wants to go home and look at his schedule and plan his procedure.  Carotid artery disease Previous left carotid endarterectomy and the patient reports a right carotid occlusion that was not present many years ago.  His carotids be checked with duplex at least annually.    [name], MD  08/19/2021 2:41 PM    This note was created with Dragon medical transcription system.  Any errors from dictation are purely unintentional

## 2021-08-19 NOTE — Assessment & Plan Note (Signed)
blood pressure control important in reducing the progression of atherosclerotic disease. On appropriate oral medications.  

## 2021-08-19 NOTE — Assessment & Plan Note (Signed)
lipid control important in reducing the progression of atherosclerotic disease. Continue statin therapy  

## 2021-08-28 ENCOUNTER — Telehealth (INDEPENDENT_AMBULATORY_CARE_PROVIDER_SITE_OTHER): Payer: Self-pay

## 2021-08-28 NOTE — Telephone Encounter (Signed)
I received a call from the patient asking about being scheduled for his left leg angio with Dr. Lucky Cowboy. I returned the call to let the patient know that I have not received a prior auth as of yet to schedule his procedure. A message was left for a return call.

## 2021-08-28 NOTE — Telephone Encounter (Signed)
Patient left a message stated he could not hear his phone. I am unsure how to get him the information about waiting on his prior auth other than leaving a message which I have done so multiple times. I did call back to possibly get him on the phone to explain again that I am waiting on his insurance to give a prior auth before scheduling his leg anigo with Dr. Lucky Cowboy.

## 2021-09-02 ENCOUNTER — Telehealth (INDEPENDENT_AMBULATORY_CARE_PROVIDER_SITE_OTHER): Payer: Self-pay

## 2021-09-02 NOTE — Telephone Encounter (Signed)
I have attempted to contact the patient to schedule him for a left leg angio with Dr. Lucky Cowboy now that I have the prior auth. A message was left for a return call.

## 2021-09-04 ENCOUNTER — Telehealth (INDEPENDENT_AMBULATORY_CARE_PROVIDER_SITE_OTHER): Payer: Self-pay

## 2021-09-04 NOTE — Telephone Encounter (Addendum)
I have tried multiple times to contact the patient to schedule him for a left leg angio with Dr. Lucky Cowboy. Patient calls back and leaves a message regarding his phone and not hearing it ring. I returned the patients call and had to leave a message for a return call. I also left a message on his emergency contact's line to return my call as well. Patient returned my call and is scheduled with Dr. Lucky Cowboy on 09/08/21 with a 10:15 am arrival time to the MM. Pre-procedure instructions were discussed and will be mailed.

## 2021-09-08 ENCOUNTER — Ambulatory Visit
Admission: RE | Admit: 2021-09-08 | Discharge: 2021-09-08 | Disposition: A | Discharge status: Home / Self Care | Payer: Medicare HMO | Attending: Vascular Surgery | Admitting: Vascular Surgery

## 2021-09-08 ENCOUNTER — Encounter: Payer: Self-pay | Admitting: Vascular Surgery

## 2021-09-08 ENCOUNTER — Encounter
Admission: RE | Disposition: A | Discharge status: Home / Self Care | Payer: Self-pay | Source: Home / Self Care | Attending: Vascular Surgery

## 2021-09-08 DIAGNOSIS — I6523 Occlusion and stenosis of bilateral carotid arteries: Secondary | ICD-10-CM | POA: Insufficient documentation

## 2021-09-08 DIAGNOSIS — Z794 Long term (current) use of insulin: Secondary | ICD-10-CM | POA: Diagnosis not present

## 2021-09-08 DIAGNOSIS — E1151 Type 2 diabetes mellitus with diabetic peripheral angiopathy without gangrene: Secondary | ICD-10-CM | POA: Insufficient documentation

## 2021-09-08 DIAGNOSIS — I10 Essential (primary) hypertension: Secondary | ICD-10-CM | POA: Diagnosis not present

## 2021-09-08 DIAGNOSIS — Z7984 Long term (current) use of oral hypoglycemic drugs: Secondary | ICD-10-CM | POA: Diagnosis not present

## 2021-09-08 DIAGNOSIS — E782 Mixed hyperlipidemia: Secondary | ICD-10-CM | POA: Diagnosis not present

## 2021-09-08 DIAGNOSIS — E11621 Type 2 diabetes mellitus with foot ulcer: Secondary | ICD-10-CM | POA: Diagnosis not present

## 2021-09-08 DIAGNOSIS — I70245 Atherosclerosis of native arteries of left leg with ulceration of other part of foot: Secondary | ICD-10-CM | POA: Diagnosis not present

## 2021-09-08 DIAGNOSIS — L97909 Non-pressure chronic ulcer of unspecified part of unspecified lower leg with unspecified severity: Secondary | ICD-10-CM

## 2021-09-08 DIAGNOSIS — Z87891 Personal history of nicotine dependence: Secondary | ICD-10-CM | POA: Insufficient documentation

## 2021-09-08 DIAGNOSIS — R6889 Other general symptoms and signs: Secondary | ICD-10-CM | POA: Diagnosis not present

## 2021-09-08 DIAGNOSIS — L97529 Non-pressure chronic ulcer of other part of left foot with unspecified severity: Secondary | ICD-10-CM | POA: Insufficient documentation

## 2021-09-08 HISTORY — PX: LOWER EXTREMITY ANGIOGRAPHY: CATH118251

## 2021-09-08 LAB — CREATININE, SERUM
Creatinine, Ser: 1.25 mg/dL — ABNORMAL HIGH (ref 0.61–1.24)
GFR, Estimated: >60 mL/min (ref >60)

## 2021-09-08 LAB — BUN: BUN: 17 mg/dL (ref 8–23)

## 2021-09-08 SURGERY — LOWER EXTREMITY ANGIOGRAPHY
Anesthesia: Moderate Sedation | Site: Leg Lower | Laterality: Left

## 2021-09-08 MED ORDER — LABETALOL HCL 5 MG/ML IV SOLN: 10.0000 mg | INTRAVENOUS | Status: DC | PRN

## 2021-09-08 MED ORDER — ONDANSETRON HCL 4 MG/2ML IJ SOLN: 4.0000 mg | Freq: Four times a day (QID) | INTRAMUSCULAR | Status: DC | PRN

## 2021-09-08 MED ORDER — CLOPIDOGREL BISULFATE 75 MG PO TABS
ORAL_TABLET | ORAL | Status: AC
  Administered 2021-09-08: 75 mg via ORAL
  Filled 2021-09-08: qty 1

## 2021-09-08 MED ORDER — SODIUM CHLORIDE 0.9 % IV SOLN
INTRAVENOUS | Status: DC
  Administered 2021-09-08: 1000 mL via INTRAVENOUS

## 2021-09-08 MED ORDER — SODIUM CHLORIDE 0.9 % IV SOLN
250.0000 mL | INTRAVENOUS | Status: DC | PRN
Start: 2021-09-08 — End: 2021-09-08

## 2021-09-08 MED ORDER — IODIXANOL 320 MG/ML IV SOLN
INTRAVENOUS | Status: DC | PRN
  Administered 2021-09-08: 40 mL

## 2021-09-08 MED ORDER — CLOPIDOGREL BISULFATE 75 MG PO TABS: 75.0000 mg | ORAL_TABLET | Freq: Every day | ORAL | Status: DC

## 2021-09-08 MED ORDER — DIPHENHYDRAMINE HCL 50 MG/ML IJ SOLN: 50.0000 mg | Freq: Once | INTRAMUSCULAR | Status: DC | PRN

## 2021-09-08 MED ORDER — ATORVASTATIN CALCIUM 10 MG PO TABS
10.0000 mg | ORAL_TABLET | Freq: Every day | ORAL | Status: DC
  Administered 2021-09-08: 10 mg via ORAL
  Filled 2021-09-08: qty 1

## 2021-09-08 MED ORDER — METHYLPREDNISOLONE SODIUM SUCC 125 MG IJ SOLR: 125.0000 mg | Freq: Once | INTRAMUSCULAR | Status: DC | PRN

## 2021-09-08 MED ORDER — SODIUM CHLORIDE 0.9 % IV SOLN: INTRAVENOUS | Status: DC

## 2021-09-08 MED ORDER — CLOPIDOGREL BISULFATE 75 MG PO TABS: 75.0000 mg | ORAL_TABLET | Freq: Every day | ORAL | 11 refills | Status: DC

## 2021-09-08 MED ORDER — CEFAZOLIN SODIUM-DEXTROSE 2-4 GM/100ML-% IV SOLN
INTRAVENOUS | Status: AC
  Administered 2021-09-08: 2 g via INTRAVENOUS
  Filled 2021-09-08: qty 100

## 2021-09-08 MED ORDER — MIDAZOLAM HCL 2 MG/2ML IJ SOLN
INTRAMUSCULAR | Status: AC
  Filled 2021-09-08: qty 4

## 2021-09-08 MED ORDER — HEPARIN SODIUM (PORCINE) 1000 UNIT/ML IJ SOLN
INTRAMUSCULAR | Status: DC | PRN
  Administered 2021-09-08: 5000 [IU] via INTRAVENOUS

## 2021-09-08 MED ORDER — MIDAZOLAM HCL 2 MG/2ML IJ SOLN
INTRAMUSCULAR | Status: DC | PRN
  Administered 2021-09-08: 1 mg via INTRAVENOUS
  Administered 2021-09-08: 2 mg via INTRAVENOUS

## 2021-09-08 MED ORDER — ONDANSETRON HCL 4 MG/2ML IJ SOLN
4.0000 mg | Freq: Four times a day (QID) | INTRAMUSCULAR | Status: DC | PRN
Start: 2021-09-08 — End: 2021-09-08

## 2021-09-08 MED ORDER — MIDAZOLAM HCL 2 MG/ML PO SYRP
8.0000 mg | ORAL_SOLUTION | Freq: Once | ORAL | Status: DC | PRN
Start: 2021-09-08 — End: 2021-09-08

## 2021-09-08 MED ORDER — ACETAMINOPHEN 325 MG PO TABS: 650.0000 mg | ORAL_TABLET | ORAL | Status: DC | PRN

## 2021-09-08 MED ORDER — SODIUM CHLORIDE 0.9% FLUSH: 3.0000 mL | INTRAVENOUS | Status: DC | PRN

## 2021-09-08 MED ORDER — FAMOTIDINE 20 MG PO TABS: 40.0000 mg | ORAL_TABLET | Freq: Once | ORAL | Status: DC | PRN

## 2021-09-08 MED ORDER — HYDRALAZINE HCL 20 MG/ML IJ SOLN: 5.0000 mg | INTRAMUSCULAR | Status: DC | PRN

## 2021-09-08 MED ORDER — FENTANYL CITRATE (PF) 100 MCG/2ML IJ SOLN
INTRAMUSCULAR | Status: AC
  Filled 2021-09-08: qty 2

## 2021-09-08 MED ORDER — FENTANYL CITRATE (PF) 100 MCG/2ML IJ SOLN
INTRAMUSCULAR | Status: DC | PRN
  Administered 2021-09-08: 25 ug via INTRAVENOUS
  Administered 2021-09-08: 50 ug via INTRAVENOUS
  Administered 2021-09-08: 25 ug via INTRAVENOUS

## 2021-09-08 MED ORDER — CEFAZOLIN SODIUM-DEXTROSE 2-4 GM/100ML-% IV SOLN: 2.0000 g | INTRAVENOUS | Status: AC

## 2021-09-08 MED ORDER — HYDROMORPHONE HCL 1 MG/ML IJ SOLN: 1.0000 mg | Freq: Once | INTRAMUSCULAR | Status: DC | PRN

## 2021-09-08 MED ORDER — ATORVASTATIN CALCIUM 10 MG PO TABS: 10.0000 mg | ORAL_TABLET | Freq: Every day | ORAL | 11 refills | Status: DC

## 2021-09-08 MED ORDER — HEPARIN SODIUM (PORCINE) 1000 UNIT/ML IJ SOLN
INTRAMUSCULAR | Status: AC
  Filled 2021-09-08: qty 10

## 2021-09-08 MED ORDER — SODIUM CHLORIDE 0.9% FLUSH: 3.0000 mL | Freq: Two times a day (BID) | INTRAVENOUS | Status: DC

## 2021-09-08 SURGICAL SUPPLY — 16 items
BALLN LUTONIX 018 4X150X130 (BALLOONS) ×1
BALLN ULTRVRSE 3X150X150 (BALLOONS) ×1
BALLOON LUTONIX 018 4X150X130 (BALLOONS) IMPLANT
BALLOON ULTRVRSE 3X150X150 (BALLOONS) IMPLANT
CATH ANGIO 5F PIGTAIL 65CM (CATHETERS) IMPLANT
CATH NAVICROSS ANGLED 135CM (MICROCATHETER) IMPLANT
DEVICE STARCLOSE SE CLOSURE (Vascular Products) IMPLANT
GLIDEWIRE ADV .035X260CM (WIRE) IMPLANT
GUIDEWIRE PFTE-COATED .018X300 (WIRE) IMPLANT
KIT ENCORE 26 ADVANTAGE (KITS) IMPLANT
PACK ANGIOGRAPHY (CUSTOM PROCEDURE TRAY) ×2 IMPLANT
SHEATH BRITE TIP 5FRX11 (SHEATH) IMPLANT
SHEATH PROBE COVER 6X72 (BAG) IMPLANT
SHEATH RAABE 6FRX70 (SHEATH) IMPLANT
SYR MEDRAD MARK 7 150ML (SYRINGE) IMPLANT
WIRE GUIDERIGHT .035X150 (WIRE) IMPLANT

## 2021-09-08 NOTE — Op Note (Signed)
[facility]  Percutaneous Study/Intervention Procedural Note   Date of Surgery: 09/08/2021  Surgeon(s):Mya Suell    Assistants:none  Pre-operative Diagnosis: PAD with ulceration LLE  Post-operative diagnosis:  Same  Procedure(s) Performed:             1.  Ultrasound guidance for vascular access right femoral artery             2.  Catheter placement into left common femoral artery from right femoral approach             3.  Aortogram and selective left lower extremity angiogram             4.  Percutaneous transluminal angioplasty of left peroneal artery with 3 mm balloon             5.  Cutaneous transluminal angioplasty of left tibioperoneal trunk and distal popliteal artery with 4 mm diameter Lutonix drug-coated balloon  6.  StarClose closure device right femoral artery  EBL: 5 cc  Contrast: 40 cc  Fluoro Time: 6.3 minutes  Moderate Conscious Sedation Time: approximately 36 minutes using 3 mg of Versed and 100 mcg of Fentanyl              Indications:  Patient is a 76 y.o.male with a non-healing ulceration of the left foot. The patient has noninvasive study showing reduced perfusion on the left. The patient is brought in for angiography for further evaluation and potential treatment.  Due to the limb threatening nature of the situation, angiogram was performed for attempted limb salvage. The patient is aware that if the procedure fails, amputation would be expected.  The patient also understands that even with successful revascularization, amputation may still be required due to the severity of the situation.  Risks and benefits are discussed and informed consent is obtained.   Procedure:  The patient was identified and appropriate procedural time out was performed.  The patient was then placed supine on the table and prepped and draped in the usual sterile fashion. Moderate conscious sedation was administered during a face to face encounter with the patient  throughout the procedure with my supervision of the RN administering medicines and monitoring the patient's vital signs, pulse oximetry, telemetry and mental status throughout from the start of the procedure until the patient was taken to the recovery room. Ultrasound was used to evaluate the right common femoral artery.  It was patent .  A digital ultrasound image was acquired.  A Seldinger needle was used to access the right common femoral artery under direct ultrasound guidance and a permanent image was performed.  A 0.035 J wire was advanced without resistance and a 5Fr sheath was placed.  Pigtail catheter was placed into the aorta and an AP aortogram was performed. This demonstrated normal aorta and iliac segments without significant stenosis.  The left renal artery was widely patent.  The right renal artery may have had mild to moderate stenosis, but it was not particularly well seen from the catheter position.  I then crossed the aortic bifurcation and advanced to the left femoral head. Selective left lower extremity angiogram was then performed. This demonstrated normal common femoral artery, profunda femoris artery, and superficial femoral artery.  In the distal popliteal artery and tibioperoneal trunk there was a high-grade stenosis in the 80 to 90% range.  Disease continued down into the proximal portion of the peroneal artery which was the only runoff distally.  There was a 70 to 80%  stenosis in the proximal peroneal artery.  The posterior tibial artery did reconstitute through peroneal collaterals at the ankle and then this filled the foot well.  The anterior tibial artery was patent over the first 5 to 6 cm but then occluded and did not reconstitute distally. It was felt that it was in the patient's best interest to proceed with intervention after these images to avoid a second procedure and a larger amount of contrast and fluoroscopy based off of the findings from the initial angiogram. The patient  was systemically heparinized and a 6 French 70 cm sheath was then placed over the Terumo Advantage wire. I then used a Kumpe catheter and the 0.018 advantage wire to navigate through the distal popliteal and TP trunk stenosis and then through the peroneal artery stenosis without difficulty.  The wire was parked at the ankle.  A 3 mm diameter by 15 cm length angioplasty balloon was inflated to 8 atm for 1 minute in the peroneal artery.  A 4 mm diameter by 10 cm length Lutonix drug-coated angioplasty balloon was inflated in the distal popliteal artery and tibioperoneal trunk and taken up to 8 atm for 1 minute.  Completion imaging showed some residual disease in the peroneal artery so I took the 3 mm diameter angioplasty balloon back down and reinflated to 10 atm for 1 minute.  Completion imaging showed less than 20% stenosis in the distal popliteal artery, tibioperoneal trunk, and peroneal arteries with good flow distally.  Attempts at cannulating crossing the posterior tibial artery occlusion were initially began, but there was not much of a trunk distally and this was not going to be a successful endeavor and his flow had been markedly improved by her previous intervention. I elected to terminate the procedure. The sheath was removed and StarClose closure device was deployed in the right femoral artery with excellent hemostatic result. The patient was taken to the recovery room in stable condition having tolerated the procedure well.  Findings:               Aortogram:  This demonstrated normal aorta and iliac segments without significant stenosis.  The left renal artery was widely patent.  The right renal artery may have had mild to moderate stenosis, but it was not particularly well seen from the catheter position.             Left lower Extremity:  This demonstrated normal common femoral artery, profunda femoris artery, and superficial femoral artery.  In the distal popliteal artery and tibioperoneal trunk  there was a high-grade stenosis in the 80 to 90% range.  Disease continued down into the proximal portion of the peroneal artery which was the only runoff distally.  There was a 70 to 80% stenosis in the proximal peroneal artery.  The posterior tibial artery did reconstitute through peroneal collaterals at the ankle and then this filled the foot well.  The anterior tibial artery was patent over the first 5 to 6 cm but then occluded and did not reconstitute distally.   Disposition: Patient was taken to the recovery room in stable condition having tolerated the procedure well.  Complications: None  Matthew [name] 09/08/2021 12:38 PM   This note was created with Dragon Medical transcription system. Any errors in dictation are purely unintentional.

## 2021-09-08 NOTE — Interval H&P Note (Signed)
History and Physical Interval Note:  09/08/2021 9:53 AM  Matthew [name]  has presented today for surgery, with the diagnosis of LLE Angio  BARD  ASO w ulceration.  The various methods of treatment have been discussed with the patient and family. After consideration of risks, benefits and other options for treatment, the patient has consented to  Procedure(s): Lower Extremity Angiography (Left) as a surgical intervention.  The patient's history has been reviewed, patient examined, no change in status, stable for surgery.  I have reviewed the patient's chart and labs.  Questions were answered to the patient's satisfaction.     [name]

## 2021-09-08 NOTE — Progress Notes (Unsigned)
   Established Patient Office Visit  Subjective   Patient ID: Matthew [name], male   DOB: 12-16-1945 Age: 76 y.o. MRN: [id number]   No chief complaint on file.   HPI Pleasant 76 year old male presenting today to discuss preoperative clearance.   Objective:    There were no vitals filed for this visit.  Physical Exam   No results found for this or any previous visit (from the past 24 hour(s)).   {Labs (Optional):23779}  The ASCVD Risk score (Arnett DK, et al., 2019) failed to calculate for the following reasons:   The patient has a prior MI or stroke diagnosis   Assessment & Plan:   No problem-specific Assessment & Plan notes found for this encounter.   No follow-ups on file.  ___________________________________________ Clearnce Sorrel, DNP, APRN, FNP-BC [facility] and [place]

## 2021-09-09 ENCOUNTER — Encounter: Payer: Self-pay | Admitting: Vascular Surgery

## 2021-09-10 ENCOUNTER — Ambulatory Visit (INDEPENDENT_AMBULATORY_CARE_PROVIDER_SITE_OTHER): Payer: Medicare HMO | Admitting: Medical-Surgical

## 2021-09-10 ENCOUNTER — Encounter: Payer: Self-pay | Admitting: Medical-Surgical

## 2021-09-10 VITALS — BP 117/65 | HR 72 | Resp 20 | Ht 74.0 in | Wt 212.0 lb

## 2021-09-10 DIAGNOSIS — I10 Essential (primary) hypertension: Secondary | ICD-10-CM

## 2021-09-10 DIAGNOSIS — E1142 Type 2 diabetes mellitus with diabetic polyneuropathy: Secondary | ICD-10-CM

## 2021-09-10 DIAGNOSIS — Z125 Encounter for screening for malignant neoplasm of prostate: Secondary | ICD-10-CM

## 2021-09-10 DIAGNOSIS — Z01818 Encounter for other preprocedural examination: Secondary | ICD-10-CM

## 2021-09-10 LAB — POCT GLYCOSYLATED HEMOGLOBIN (HGB A1C): HbA1c, POC (controlled diabetic range): 8.3 % — AB (ref 0.0–7.0)

## 2021-09-10 MED ORDER — OZEMPIC (0.25 OR 0.5 MG/DOSE) 2 MG/3ML ~~LOC~~ SOPN: 0.2500 mg | PEN_INJECTOR | SUBCUTANEOUS | 0 refills | Status: DC

## 2021-09-10 NOTE — Progress Notes (Signed)
Task completed. Patient was informed about adding Ozempic to his medication regimen. Patient was notified of provider's recommendations and plan of care. Patient is aware to contact the clinic if he is unable to pick up the medication from the pharmacy or if he has any side effects from the medication. Patient voiced understanding.

## 2021-09-11 ENCOUNTER — Other Ambulatory Visit: Payer: Self-pay | Admitting: Medical-Surgical

## 2021-09-11 LAB — PSA: PSA: <0.04 ng/mL (ref <=4.00)

## 2021-09-11 LAB — LIPID PANEL
Cholesterol: 177 mg/dL (ref <200)
HDL: 36 mg/dL — ABNORMAL LOW (ref >=40)
LDL Cholesterol (Calc): 104 mg/dL (calc) — ABNORMAL HIGH
Non-HDL Cholesterol (Calc): 141 mg/dL (calc) — ABNORMAL HIGH (ref <130)
Total CHOL/HDL Ratio: 4.9 (calc) (ref <5.0)
Triglycerides: 262 mg/dL — ABNORMAL HIGH (ref <150)

## 2021-09-17 ENCOUNTER — Other Ambulatory Visit: Payer: Self-pay | Admitting: Medical-Surgical

## 2021-09-24 ENCOUNTER — Telehealth: Payer: Self-pay | Admitting: Medical-Surgical

## 2021-09-24 NOTE — Telephone Encounter (Signed)
Made in error

## 2021-10-09 ENCOUNTER — Encounter (INDEPENDENT_AMBULATORY_CARE_PROVIDER_SITE_OTHER): Payer: Self-pay | Admitting: Nurse Practitioner

## 2021-10-09 ENCOUNTER — Other Ambulatory Visit (INDEPENDENT_AMBULATORY_CARE_PROVIDER_SITE_OTHER): Payer: Self-pay | Admitting: Vascular Surgery

## 2021-10-09 ENCOUNTER — Ambulatory Visit (INDEPENDENT_AMBULATORY_CARE_PROVIDER_SITE_OTHER): Payer: Medicare HMO | Admitting: Nurse Practitioner

## 2021-10-09 ENCOUNTER — Ambulatory Visit (INDEPENDENT_AMBULATORY_CARE_PROVIDER_SITE_OTHER): Payer: Medicare HMO

## 2021-10-09 VITALS — BP 170/62 | HR 42 | Resp 18 | Ht 74.0 in | Wt 223.0 lb

## 2021-10-09 DIAGNOSIS — I70249 Atherosclerosis of native arteries of left leg with ulceration of unspecified site: Secondary | ICD-10-CM

## 2021-10-09 DIAGNOSIS — Z9862 Peripheral vascular angioplasty status: Secondary | ICD-10-CM

## 2021-10-09 DIAGNOSIS — I10 Essential (primary) hypertension: Secondary | ICD-10-CM

## 2021-10-09 DIAGNOSIS — E782 Mixed hyperlipidemia: Secondary | ICD-10-CM | POA: Diagnosis not present

## 2021-10-09 MED ORDER — ATORVASTATIN CALCIUM 10 MG PO TABS: 10.0000 mg | ORAL_TABLET | Freq: Every day | ORAL | 11 refills | Status: AC

## 2021-10-09 MED ORDER — CLOPIDOGREL BISULFATE 75 MG PO TABS
75.0000 mg | ORAL_TABLET | Freq: Every day | ORAL | 11 refills | Status: AC
Start: 2021-10-09 — End: ?

## 2021-10-09 NOTE — Progress Notes (Signed)
Subjective:    Patient ID: Matthew [name], male    DOB: [birth year], 76 y.o.   MRN: [id number] No chief complaint on file.   The patient returns to the office for followup and review status post angiogram with intervention on 09/08/2021.   Procedure: Procedure(s) Performed:             1.  Ultrasound guidance for vascular access right femoral artery             2.  Catheter placement into left common femoral artery from right femoral approach             3.  Aortogram and selective left lower extremity angiogram             4.  Percutaneous transluminal angioplasty of left peroneal artery with 3 mm balloon             5.  Cutaneous transluminal angioplasty of left tibioperoneal trunk and distal popliteal artery with 4 mm diameter Lutonix drug-coated balloon             6.  StarClose closure device right femoral artery  The patient notes improvement in the lower extremity symptoms. No interval shortening of the patient's claudication distance or rest pain symptoms. No new ulcers or wounds have occurred since the last visit.  There have been no significant changes to the patient's overall health care.  No documented history of amaurosis fugax or recent TIA symptoms. There are no recent neurological changes noted. No documented history of DVT, PE or superficial thrombophlebitis. The patient denies recent episodes of angina or shortness of breath.   ABI's Rt=1.15 and Lt=1.04  (previous ABI's Rt=0.99 and Lt=0.41) Duplex US of the bilateral tibial arteries shows biphasic waveforms with normal toe waveforms bilaterally    Review of Systems  Cardiovascular:  Negative for leg swelling.  Skin:  Positive for wound.  All other systems reviewed and are negative.      Objective:   Physical Exam Vitals reviewed.  HENT:     Head: Normocephalic.  Cardiovascular:     Rate and Rhythm: Normal rate and regular rhythm.  Pulmonary:     Effort: Pulmonary effort is normal.  Skin:    General:  Skin is warm and dry.  Neurological:     Mental Status: He is alert and oriented to person, place, and time.  Psychiatric:        Mood and Affect: Mood normal.        Behavior: Behavior normal.        Thought Content: Thought content normal.        Judgment: Judgment normal.     BP (!) 170/62 (BP Location: Left Arm)   Pulse (!) 42   Resp 18   Ht '6\' 2"'$  (1.88 m)   Wt 223 lb (101.2 kg)   BMI 28.63 kg/m   Past Medical History:  Diagnosis Date   Acute upper respiratory infection 02/18/2014   Allergy    Arthritis    Carotid artery disease ([place]) 05/11/2013   Chicken pox as a child   Contact with powered lawnmower as cause of accidental injury at home as place of occurrence 10/03/2014   Diabetes mellitus without complication ([place])    Fall    every since his stroke   GERD (gastroesophageal reflux disease)    Measles    Other and unspecified hyperlipidemia 05/11/2013   Peripheral neuropathy    Poor dental hygiene 05/11/2013   Shingles 76  yrs old   Sleep apnea    [facility]   Stroke [facility]) 2007   Vocal cord dysfunction     Social History   Socioeconomic History   Marital status: Divorced    Spouse name: Not on file   Number of children: Not on file   Years of education: 14   Highest education level: Some college, no degree  Occupational History   Occupation: Veteran  Tobacco Use   Smoking status: Some Days    Types: Cigars    Start date: 11/27/1997   Smokeless tobacco: Never  Vaping Use   Vaping Use: Never used  Substance and Sexual Activity   Alcohol use: Yes    Alcohol/week: 0.0 standard drinks of alcohol    Comment: occasionally drinks wine    Drug use: No   Sexual activity: Yes    Partners: Female  Other Topics Concern   Not on file  Social History Narrative   Lives alone. He enjoys sitting outside and enjoys doing work outside.   Social Determinants of Health   Financial Resource Strain: Low Risk  (11/15/2020)   Overall Financial Resource  Strain (CARDIA)    Difficulty of Paying Living Expenses: Not hard at all  Food Insecurity: No Food Insecurity (11/15/2020)   Hunger Vital Sign    Worried About Running Out of Food in the Last Year: Never true    Ran Out of Food in the Last Year: Never true  Transportation Needs: No Transportation Needs (11/15/2020)   PRAPARE - Hydrologist (Medical): No    Lack of Transportation (Non-Medical): No  Physical Activity: Inactive (11/15/2020)   Exercise Vital Sign    Days of Exercise per Week: 0 days    Minutes of Exercise per Session: 0 min  Stress: No Stress Concern Present (11/15/2020)   [place]    Feeling of Stress : Not at all  Social Connections: Socially Isolated (11/15/2020)   Social Connection and Isolation Panel [NHANES]    Frequency of Communication with Friends and Family: More than three times a week    Frequency of Social Gatherings with Friends and Family: More than three times a week    Attends Religious Services: Never    Marine scientist or Organizations: No    Attends Archivist Meetings: Never    Marital Status: Divorced  Human resources officer Violence: Not At Risk (11/15/2020)   Humiliation, Afraid, Rape, and Kick questionnaire    Fear of Current or Ex-Partner: No    Emotionally Abused: No    Physically Abused: No    Sexually Abused: No    Past Surgical History:  Procedure Laterality Date   APPENDECTOMY  2000   ruptured   CAROTID ENDARTERECTOMY  09/2010   EYE SURGERY  2010   cataracts bilateral   IR RADIOLOGIST EVAL & MGMT  06/08/2018   LOWER EXTREMITY ANGIOGRAPHY Left 09/08/2021   Procedure: Lower Extremity Angiography;  Surgeon: Algernon Huxley, MD;  Location: [facility];  Service: Cardiovascular;  Laterality: Left;    Family History  Problem Relation Age of Onset   Cancer Mother        kidney   Heart attack Father    Stroke Brother     Heart attack Brother    Stroke Brother     No Known Allergies     Latest Ref Rng & Units 10/02/2020  2:03 PM 08/15/2020   11:36 AM 02/28/2014   11:05 AM  CBC  WBC 3.8 - 10.8 Thousand/uL 9.0  10.4  7.6   Hemoglobin 13.2 - 17.1 g/dL 14.2  13.7  15.8   Hematocrit 38.5 - 50.0 % 43.2  42.4  45.5   Platelets 140 - 400 Thousand/uL 375  311  211.0       CMP     Component Value Date/Time   NA 137 04/08/2021 0000   NA 135 (L) 09/29/2013 0419   K 4.2 04/08/2021 0000   K 4.8 09/29/2013 0419   CL 101 04/08/2021 0000   CL 103 09/29/2013 0419   CO2 24 (A) 04/08/2021 0000   CO2 25 09/29/2013 0419   GLUCOSE 225 (H) 10/02/2020 1403   GLUCOSE 216 (H) 09/29/2013 0419   BUN 17 09/08/2021 1007   BUN 12 04/08/2021 0000   BUN 16 09/29/2013 0419   CREATININE 1.25 (H) 09/08/2021 1007   CREATININE 1.39 (H) 10/02/2020 1403   CALCIUM 9.0 04/08/2021 0000   CALCIUM 8.2 (L) 09/29/2013 0419   PROT 8.2 (H) 10/02/2020 1403   PROT 7.4 01/23/2020 0957   ALBUMIN 4.0 08/15/2020 1136   AST 17 10/02/2020 1403   ALT 12 10/02/2020 1403   ALKPHOS 59 08/15/2020 1136   BILITOT 0.8 10/02/2020 1403   GFRNONAA >60 09/08/2021 1007   GFRNONAA >60 09/29/2013 0419   GFRAA >60 09/29/2013 0419     VAS [place] ABI WITH/WO TBI  Result Date: 08/19/2021  LOWER EXTREMITY DOPPLER STUDY Patient Name:  MONTRAIL MEHRER  Date of Exam:   08/19/2021 Medical Rec #: [id number]     Accession #:    [id number] Date of Birth: 17-Mar-1945     Patient Gender: M Patient Age:   65 years Exam Location:  [facility] Procedure:      VAS [place] ABI WITH/WO TBI Referring Phys: Eulogio Ditch --------------------------------------------------------------------------------  Indications: Ulceration, and peripheral artery disease. High Risk Factors: Hypertension, Diabetes. Other Factors: Prior vascular intervention at outside facility per patient.  Vascular Interventions: Prior carotid endarterectomy. Performing Technologist: Delorise Shiner RVT   Examination Guidelines: A complete evaluation includes at minimum, Doppler waveform signals and systolic blood pressure reading at the level of bilateral brachial, anterior tibial, and posterior tibial arteries, when vessel segments are accessible. Bilateral testing is considered an integral part of a complete examination. Photoelectric Plethysmograph (PPG) waveforms and toe systolic pressure readings are included as required and additional duplex testing as needed. Limited examinations for reoccurring indications may be performed as noted.  ABI Findings: +---------+------------------+-----+---------+--------+ Right    Rt Pressure (mmHg)IndexWaveform Comment  +---------+------------------+-----+---------+--------+ Brachial 152                                      +---------+------------------+-----+---------+--------+ ATA      151               0.99 biphasic          +---------+------------------+-----+---------+--------+ PTA      150               0.99 triphasic         +---------+------------------+-----+---------+--------+ Great Toe55                0.36                   +---------+------------------+-----+---------+--------+ +---------+------------------+-----+----------+-------+ Left  Lt Pressure (mmHg)IndexWaveform  Comment +---------+------------------+-----+----------+-------+ Brachial 148                                      +---------+------------------+-----+----------+-------+ ATA      62                0.41 monophasic        +---------+------------------+-----+----------+-------+ PTA      62                0.41 monophasic        +---------+------------------+-----+----------+-------+ Great Toe52                0.34                   +---------+------------------+-----+----------+-------+ +-------+-----------+-----------+------------+------------+ ABI/TBIToday's ABIToday's TBIPrevious ABIPrevious TBI  +-------+-----------+-----------+------------+------------+ Right  0.99       0.36       1.09                     +-------+-----------+-----------+------------+------------+ Left   0.41       0.34       0.78                     +-------+-----------+-----------+------------+------------+  Right ABIs appear essentially unchanged compared to prior study on 06/21/2018. Left ABIs appear decreased compared to prior study on 06/21/2018.  Summary: Right: Resting right ankle-brachial index is within normal range. No evidence of significant right lower extremity arterial disease. The right toe-brachial index is abnormal. Left: Resting left ankle-brachial index indicates severe left lower extremity arterial disease. The left toe-brachial index is abnormal. *See table(s) above for measurements and observations.  Electronically signed by [name] MD on 08/19/2021 at 3:28:46 PM.    Final        Assessment & Plan:   1. 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  He denies any significant swelling or issues postprocedure.  Patient should continue dual antiplatelet therapy. - VAS [place] ABI WITH/WO TBI  2. Essential hypertension with goal blood pressure less than 140/90 Continue antihypertensive medications as already ordered, these medications have been reviewed and there are no changes at this time.   3. Hyperlipidemia, mixed Continue statin as ordered and reviewed, no changes at this time    Current Outpatient Medications on File Prior to Visit  Medication Sig Dispense Refill   Adhesive Bandages (SM BANDAGES FABRIC) MISC Apply to wounds as needed.  1 3/4"x4" bandage [employer] by [employer] (Patient taking differently: Apply to wounds as needed.  1 3/4"x4" bandage [employer] by [employer]) 70 each 3   aspirin 81 MG tablet Take 81 mg by mouth daily.     Carboxymethylcellulose Sodium 0.25 %  SOLN Place 1 drop into both eyes 4 (four) times daily as needed (dry, irritated eyes.).     insulin glargine (LANTUS) 100 UNIT/ML injection Inject 60-80 Units into the skin daily.     Semaglutide,0.25 or 0.'5MG'$ /DOS, (OZEMPIC, 0.25 OR 0.5 MG/DOSE,) 2 MG/3ML SOPN Inject 0.25 mg into the skin once a week. 3 mL 0   Silver (SILVRSTAT WOUND DRESSING) GEL Apply 1 application topically daily. 28.35 g 11   silver sulfADIAZINE (SILVADENE) 1 % cream Apply 1 application topically daily. 400 g 0   No current facility-administered medications on file  prior to visit.    There are no Patient Instructions on file for this visit. No follow-ups on file.   Kris Hartmann, NP

## 2021-10-10 ENCOUNTER — Encounter (INDEPENDENT_AMBULATORY_CARE_PROVIDER_SITE_OTHER): Payer: Self-pay | Admitting: Nurse Practitioner

## 2021-10-21 NOTE — Progress Notes (Signed)
[facility] Quality Team Note  Name: Matthew [name] Date of Birth: May 22, 1945 MRN: [id number] Date: 10/21/2021  [facility] Quality Team has reviewed this patient's chart, please see recommendations below:  [facility] Quality Other; COL GAP- PATIENT DUE FOR COLONOSCOPY, IF PATIENT DECLINES PLEASE OFFER COLOGUARD OR FOBT EED GAP- PATIENT DUE FOR RETINAL EYE SCREENING, PATIENT PREVIOUSLY DECLINED SCHEDULING FOR EYE EVENT. PATIENT HAS UPCOMING APPT WITH PCP 12/16/2021

## 2021-11-11 ENCOUNTER — Other Ambulatory Visit: Payer: Self-pay | Admitting: Medical-Surgical

## 2021-11-17 ENCOUNTER — Encounter (INDEPENDENT_AMBULATORY_CARE_PROVIDER_SITE_OTHER): Payer: Self-pay

## 2021-11-20 ENCOUNTER — Telehealth: Payer: Self-pay

## 2021-11-20 NOTE — Telephone Encounter (Signed)
Patient came by to request that you refer him to see a ear, nose, and throat doctor, because the doctor at the [place] has released him and stated that there is nothing he can do with his hearing at this point. He stated that his PCP could get him further help. Patient is requesting a call back.Please advise? tvt

## 2021-11-20 NOTE — Telephone Encounter (Signed)
Needs appointment since we have never discussed this before.  If he would like to wait until his appointment on December 5 that is already scheduled, this would likely be the best option.

## 2021-11-21 NOTE — Telephone Encounter (Signed)
Called patient left voicemail to let him know that we need to set up an office visit or we can wait until his next visit in December. Left message if he would like to be seen before the next visit call back to schedule an appointment. tvt

## 2021-12-16 ENCOUNTER — Ambulatory Visit: Payer: Medicare HMO | Admitting: Medical-Surgical

## 2021-12-16 LAB — COMPREHENSIVE METABOLIC PANEL
Calcium: 8.9 (ref 8.7–10.7)
eGFR: 62

## 2021-12-16 LAB — BASIC METABOLIC PANEL
CO2: 27 — AB (ref 13–22)
Chloride: 108 (ref 99–108)
Creatinine: 1.2 (ref 0.6–1.3)
Glucose: 130
Potassium: 4.2 mEq/L (ref 3.5–5.1)
Sodium: 138 (ref 137–147)

## 2021-12-16 LAB — MICROALBUMIN / CREATININE URINE RATIO: Microalb Creat Ratio: 80.5

## 2021-12-16 LAB — HEPATIC FUNCTION PANEL
ALT: 38 U/L (ref 10–40)
AST: 30 (ref 14–40)
Alkaline Phosphatase: 77 (ref 25–125)
Bilirubin, Direct: 0.2
Bilirubin, Total: 0.9

## 2021-12-16 LAB — MICROALBUMIN, URINE: Microalb, Ur: 10.7

## 2021-12-16 LAB — LIPID PANEL
Cholesterol: 135 (ref 0–200)
HDL: 41 (ref 35–70)
LDL Cholesterol: 76
Triglycerides: 92 (ref 40–160)

## 2021-12-16 LAB — PROTEIN / CREATININE RATIO, URINE: Creatinine, Urine: 133

## 2021-12-16 LAB — HEMOGLOBIN A1C: Hemoglobin A1C: 8.6

## 2021-12-23 ENCOUNTER — Encounter: Payer: Self-pay | Admitting: Medical-Surgical

## 2021-12-23 ENCOUNTER — Ambulatory Visit (INDEPENDENT_AMBULATORY_CARE_PROVIDER_SITE_OTHER): Payer: Medicare HMO | Admitting: Medical-Surgical

## 2021-12-23 VITALS — BP 119/42 | HR 54 | Resp 20 | Ht 74.0 in | Wt 216.0 lb

## 2021-12-23 DIAGNOSIS — I10 Essential (primary) hypertension: Secondary | ICD-10-CM | POA: Diagnosis not present

## 2021-12-23 DIAGNOSIS — E1142 Type 2 diabetes mellitus with diabetic polyneuropathy: Secondary | ICD-10-CM

## 2021-12-23 DIAGNOSIS — E782 Mixed hyperlipidemia: Secondary | ICD-10-CM | POA: Diagnosis not present

## 2021-12-23 MED ORDER — OZEMPIC (0.25 OR 0.5 MG/DOSE) 2 MG/3ML ~~LOC~~ SOPN: 0.5000 mg | PEN_INJECTOR | SUBCUTANEOUS | 2 refills | Status: AC

## 2021-12-23 NOTE — Progress Notes (Signed)
   Established Patient Office Visit  Subjective   Patient ID: Matthew [name], male   DOB: December 07, 1945 Age: 76 y.o. MRN: [id number]   Chief Complaint  Patient presents with  . Diabetes  . Hypertension  . Follow-up   HPI    Objective:    Vitals:   12/23/21 0936  BP: (!) 119/42  Pulse: (!) 54  Resp: 20  Height: '6\' 2"'$  (1.88 m)  Weight: 216 lb (98 kg)  SpO2: 99%  BMI (Calculated): 27.72    Physical Exam   No results found for this or any previous visit (from the past 24 hour(s)).   {Labs (Optional):23779}  The ASCVD Risk score (Arnett DK, et al., 2019) failed to calculate for the following reasons:   The patient has a prior MI or stroke diagnosis   Assessment & Plan:   No problem-specific Assessment & Plan notes found for this encounter.   No follow-ups on file.  ___________________________________________ Clearnce Sorrel, DNP, APRN, FNP-BC [facility] and [place]

## 2021-12-24 DIAGNOSIS — I442 Atrioventricular block, complete: Secondary | ICD-10-CM | POA: Insufficient documentation

## 2021-12-29 ENCOUNTER — Telehealth: Payer: Self-pay | Admitting: *Deleted

## 2021-12-29 ENCOUNTER — Encounter: Payer: Self-pay | Admitting: *Deleted

## 2021-12-29 NOTE — Patient Outreach (Signed)
  Care Coordination [facility] Note Transition Care Management Unsuccessful Follow-up Telephone Call  Date of discharge and from where:  Friday, 12/26/21 [facility]; bradycardia, complete heart block with PPM placement  Attempts:  1st Attempt  Reason for unsuccessful TCM follow-up call:  Left voice message  Oneta Rack, RN, BSN, CCRN Alumnus RN CM Care Coordination/ Transition of [place] Management [phone]: direct office

## 2021-12-30 ENCOUNTER — Encounter: Payer: Self-pay | Admitting: *Deleted

## 2021-12-30 ENCOUNTER — Telehealth: Payer: Self-pay | Admitting: *Deleted

## 2021-12-30 NOTE — Patient Outreach (Addendum)
Care Coordination [facility] Note Transition Care Management Follow-up Telephone Call Date of discharge and from where: Friday 12/26/21, [facility]; bradycardia with CHB/ pacer insertion How have you been since you were released from the hospital? "I am doing great; not having any problems... I have never felt better.  I think when I went to see Dr. Caryl Asp before the hospital visit, I wish they had picked up on this heart rate problem, it might have saved me some problems.  But I have talked with Dr. Caryl Asp about my concerns.... I still think she is a great doctor and I love her from the bottom of my heart, we have a really good relationship.  I am on my way to the [place] doctor, my PCP there at the [place] that discovered the problem, Dr. Clydene Laming.  I have been checking my blood pressures at home and they are looking good-- I am taking the numbers to the doctor this afternoon so he can look at them. Any questions or concerns? No  Items Reviewed: Did the pt receive and understand the discharge instructions provided? Yes  Medications obtained and verified? Yes - declines full medication review- [facility]- confirms he obtained and took post-procedure antibiotics as instructed post-recent hospital discharge; verbalizes plans to discuss all medications with [facility] during this afternoon's office visit Other? No  Any new allergies since your discharge? No  Dietary orders reviewed? Yes Do you have support at home? Yes - girlfriend assisting with care needs as indicated; patient reports he is independent in self-care  Home Care and Equipment/Supplies: Were home health services ordered? no If so, what is the name of the agency? N/A  Has the agency set up a time to come to the patient's home? not applicable Were any new equipment or medical supplies ordered?  No What is the name of the medical supply agency? N/A Were you able to get the supplies/equipment? not applicable Do you have any  questions related to the use of the equipment or supplies? No N/A  Functional Questionnaire: (I = Independent and D = Dependent) ADLs: I  girlfriend assisting with care needs as indicated  Bathing/Dressing- I  Meal Prep- I  girlfriend assisting with care needs as indicated  Eating- I  Maintaining continence- I  Transferring/Ambulation- I  Managing Meds- I  Follow up appointments reviewed:  PCP Hospital f/u appt confirmed? Yes  Scheduled to see PCP at [facility] on today, 12/30/21 @ 3:00 pm Specialist Hospital f/u appt confirmed? No  Scheduled to see - on - @ - Are transportation arrangements needed? No  If their condition worsens, is the pt aware to call PCP or go to the Emergency Dept.? Yes Was the patient provided with contact information for the PCP's office or ED? No- patient declines; reports has contact information for all care providers Was to pt encouraged to call back with questions or concerns? Yes  SDOH assessments and interventions completed:   Yes SDOH Interventions Today    Flowsheet Row Most Recent Value  SDOH Interventions   Food Insecurity Interventions Intervention Not Indicated  Transportation Interventions Intervention Not Indicated  [drives self]      Care Coordination Interventions:  Reviewed recent blood pressures at home with patient; provided education around signs/ symptoms incisional infection along with corresponding action plan; encouraged patient to allow PCP to see incision at this afternoon's appointment and obtain specific instructions around post-pacer instructions; provided general instructions for post-pacer discharge instructions  Encounter Outcome:  Pt. Visit Completed    Oneta Rack, RN, BSN, CCRN Alumnus RN CM Care Coordination/ Transition of [place] Management [phone]: direct office

## 2022-01-09 ENCOUNTER — Ambulatory Visit (INDEPENDENT_AMBULATORY_CARE_PROVIDER_SITE_OTHER): Payer: Medicare HMO | Admitting: Vascular Surgery

## 2022-01-09 ENCOUNTER — Encounter (INDEPENDENT_AMBULATORY_CARE_PROVIDER_SITE_OTHER): Payer: Medicare HMO

## 2022-02-09 ENCOUNTER — Other Ambulatory Visit (INDEPENDENT_AMBULATORY_CARE_PROVIDER_SITE_OTHER): Payer: Self-pay | Admitting: Nurse Practitioner

## 2022-02-09 DIAGNOSIS — Z9889 Other specified postprocedural states: Secondary | ICD-10-CM

## 2022-02-11 ENCOUNTER — Ambulatory Visit (INDEPENDENT_AMBULATORY_CARE_PROVIDER_SITE_OTHER): Payer: Medicare HMO | Admitting: Nurse Practitioner

## 2022-02-11 ENCOUNTER — Ambulatory Visit (INDEPENDENT_AMBULATORY_CARE_PROVIDER_SITE_OTHER): Payer: Medicare HMO

## 2022-02-11 VITALS — BP 104/65 | HR 73 | Resp 16 | Wt 218.0 lb

## 2022-02-11 DIAGNOSIS — I70249 Atherosclerosis of native arteries of left leg with ulceration of unspecified site: Secondary | ICD-10-CM

## 2022-02-11 DIAGNOSIS — I10 Essential (primary) hypertension: Secondary | ICD-10-CM | POA: Diagnosis not present

## 2022-02-11 DIAGNOSIS — Z9889 Other specified postprocedural states: Secondary | ICD-10-CM

## 2022-02-11 DIAGNOSIS — E782 Mixed hyperlipidemia: Secondary | ICD-10-CM | POA: Diagnosis not present

## 2022-02-11 DIAGNOSIS — I739 Peripheral vascular disease, unspecified: Secondary | ICD-10-CM | POA: Diagnosis not present

## 2022-02-16 LAB — VAS US ABI WITH/WO TBI
Left ABI: 0.91
Right ABI: 1.01

## 2022-03-01 ENCOUNTER — Encounter (INDEPENDENT_AMBULATORY_CARE_PROVIDER_SITE_OTHER): Payer: Self-pay | Admitting: Nurse Practitioner

## 2022-03-01 NOTE — Progress Notes (Signed)
Subjective:    Patient ID: Matthew [name], male    DOB: 1945/05/16, 77 y.o.   MRN: [id number] Chief Complaint  Patient presents with   Follow-up    Ultrasound follow up    Matthew [name] is a 77 year old male who returns today for follow-up of his peripheral arterial disease.  He most recently underwent a left lower extremity angiogram in 09/08/2021.  He most recently had a pacemaker placed.  He has been doing well since that time.  He currently denies any significant issues with claudication or rest pain.  He had a previous wound that has essentially just about healed.  He denies any new wounds on his lower extremities.  Today the patient has ABI of 1.01 on the right and 0.91 on the left.  Additional left lower extremity arterial duplex notes primarily biphasic waveforms however there is a noted 50 to 74% stenosis in the TP trunk.    Review of Systems  Skin:  Positive for wound.  All other systems reviewed and are negative.      Objective:   Physical Exam Vitals reviewed.  HENT:     Head: Normocephalic.  Cardiovascular:     Rate and Rhythm: Normal rate.  Pulmonary:     Effort: Pulmonary effort is normal.  Skin:    General: Skin is warm and dry.  Neurological:     Mental Status: He is alert and oriented to person, place, and time.  Psychiatric:        Mood and Affect: Mood normal.        Behavior: Behavior normal.        Thought Content: Thought content normal.        Judgment: Judgment normal.     BP 104/65 (BP Location: Left Arm)   Pulse 73   Resp 16   Wt 218 lb (98.9 kg)   BMI 27.99 kg/m   Past Medical History:  Diagnosis Date   Acute upper respiratory infection 02/18/2014   Allergy    Arthritis    Carotid artery disease ([place]) 05/11/2013   Chicken pox as a child   Contact with powered lawnmower as cause of accidental injury at home as place of occurrence 10/03/2014   Diabetes mellitus without complication ([place])    Fall    every since his stroke   GERD  (gastroesophageal reflux disease)    Measles    Other and unspecified hyperlipidemia 05/11/2013   Peripheral neuropathy    Poor dental hygiene 05/11/2013   Shingles 77 yrs old   Sleep apnea    [facility]   Stroke [facility]) 2007   Vocal cord dysfunction     Social History   Socioeconomic History   Marital status: Divorced    Spouse name: Not on file   Number of children: Not on file   Years of education: 14   Highest education level: Some college, no degree  Occupational History   Occupation: Veteran  Tobacco Use   Smoking status: Some Days    Types: Cigars    Start date: 11/27/1997   Smokeless tobacco: Never  Vaping Use   Vaping Use: Never used  Substance and Sexual Activity   Alcohol use: Yes    Alcohol/week: 0.0 standard drinks of alcohol    Comment: occasionally drinks wine    Drug use: No   Sexual activity: Yes    Partners: Female  Other Topics Concern   Not on file  Social History Narrative  Lives alone. He enjoys sitting outside and enjoys doing work outside.   Social Determinants of Health   Financial Resource Strain: Low Risk  (11/15/2020)   Overall Financial Resource Strain (CARDIA)    Difficulty of Paying Living Expenses: Not hard at all  Food Insecurity: No Food Insecurity (12/30/2021)   Hunger Vital Sign    Worried About Running Out of Food in the Last Year: Never true    Ran Out of Food in the Last Year: Never true  Transportation Needs: No Transportation Needs (12/30/2021)   PRAPARE - Hydrologist (Medical): No    Lack of Transportation (Non-Medical): No  Physical Activity: Inactive (11/15/2020)   Exercise Vital Sign    Days of Exercise per Week: 0 days    Minutes of Exercise per Session: 0 min  Stress: No Stress Concern Present (11/15/2020)   [place]    Feeling of Stress : Not at all  Social Connections: Socially Isolated (11/15/2020)    Social Connection and Isolation Panel [NHANES]    Frequency of Communication with Friends and Family: More than three times a week    Frequency of Social Gatherings with Friends and Family: More than three times a week    Attends Religious Services: Never    Marine scientist or Organizations: No    Attends Archivist Meetings: Never    Marital Status: Divorced  Human resources officer Violence: Not At Risk (11/15/2020)   Humiliation, Afraid, Rape, and Kick questionnaire    Fear of Current or Ex-Partner: No    Emotionally Abused: No    Physically Abused: No    Sexually Abused: No    Past Surgical History:  Procedure Laterality Date   APPENDECTOMY  2000   ruptured   CAROTID ENDARTERECTOMY  09/2010   EYE SURGERY  2010   cataracts bilateral   IR RADIOLOGIST EVAL & MGMT  06/08/2018   LOWER EXTREMITY ANGIOGRAPHY Left 09/08/2021   Procedure: Lower Extremity Angiography;  Surgeon: Algernon Huxley, MD;  Location: [facility];  Service: Cardiovascular;  Laterality: Left;    Family History  Problem Relation Age of Onset   Cancer Mother        kidney   Heart attack Father    Stroke Brother    Heart attack Brother    Stroke Brother     No Known Allergies     Latest Ref Rng & Units 10/02/2020    2:03 PM 08/15/2020   11:36 AM 02/28/2014   11:05 AM  CBC  WBC 3.8 - 10.8 Thousand/uL 9.0  10.4  7.6   Hemoglobin 13.2 - 17.1 g/dL 14.2  13.7  15.8   Hematocrit 38.5 - 50.0 % 43.2  42.4  45.5   Platelets 140 - 400 Thousand/uL 375  311  211.0       CMP     Component Value Date/Time   NA 138 12/16/2021 0000   NA 135 (L) 09/29/2013 0419   K 4.2 12/16/2021 0000   K 4.8 09/29/2013 0419   CL 108 12/16/2021 0000   CL 103 09/29/2013 0419   CO2 27 (A) 12/16/2021 0000   CO2 25 09/29/2013 0419   GLUCOSE 225 (H) 10/02/2020 1403   GLUCOSE 216 (H) 09/29/2013 0419   BUN 17 09/08/2021 1007   BUN 12 04/08/2021 0000   BUN 16 09/29/2013 0419   CREATININE 1.2 12/16/2021 0000    CREATININE 1.25 (  H) 09/08/2021 1007   CREATININE 1.39 (H) 10/02/2020 1403   CALCIUM 8.9 12/16/2021 0000   CALCIUM 8.2 (L) 09/29/2013 0419   PROT 8.2 (H) 10/02/2020 1403   PROT 7.4 01/23/2020 0957   ALBUMIN 4.0 08/15/2020 1136   AST 30 12/16/2021 0000   ALT 38 12/16/2021 0000   ALKPHOS 77 12/16/2021 0000   BILITOT 0.8 10/02/2020 1403   GFRNONAA >60 09/08/2021 1007   GFRNONAA >60 09/29/2013 0419   GFRAA >60 09/29/2013 0419     VAS [place] ABI WITH/WO TBI  Result Date: 02/16/2022  LOWER EXTREMITY DOPPLER STUDY Patient Name:  JERRARD SPONAUGLE  Date of Exam:   02/11/2022 Medical Rec #: [id number]     Accession #:    [id number] Date of Birth: 1945-03-31     Patient Gender: M Patient Age:   13 years Exam Location:  [facility] Procedure:      VAS [place] ABI WITH/WO TBI Referring Phys: Eulogio Ditch --------------------------------------------------------------------------------  Indications: Ulceration, and peripheral artery disease. High Risk Factors: Hypertension, Diabetes, current smoker. Other Factors: Prior vascular intervention at outside facility per patient.  Vascular Interventions: Prior carotid endarterectomy.                          09/08/2021: Aortagram and Selective Left Lower Extremity                         Angiogram. PTA of Left Peroneal Artery with 3 mm                         balloon. CTA of Tib/Peroneal trunk and distal Popliteal                         Artery with 45m diameter Lutonix drug coated balloon. Performing Technologist: MDelorise ShinerRVT  Examination Guidelines: A complete evaluation includes at minimum, Doppler waveform signals and systolic blood pressure reading at the level of bilateral brachial, anterior tibial, and posterior tibial arteries, when vessel segments are accessible. Bilateral testing is considered an integral part of a complete examination. Photoelectric Plethysmograph (PPG) waveforms and toe systolic pressure readings are included as required and additional  duplex testing as needed. Limited examinations for reoccurring indications may be performed as noted.  ABI Findings: +---------+------------------+-----+---------+--------+ Right    Rt Pressure (mmHg)IndexWaveform Comment  +---------+------------------+-----+---------+--------+ Brachial 138                                      +---------+------------------+-----+---------+--------+ PTA      139               1.01 triphasic         +---------+------------------+-----+---------+--------+ DP       111               0.80 triphasic         +---------+------------------+-----+---------+--------+ Great Toe74                0.54                   +---------+------------------+-----+---------+--------+ +---------+------------------+-----+----------+-------+ Left     Lt Pressure (mmHg)IndexWaveform  Comment +---------+------------------+-----+----------+-------+ Brachial 137                                      +---------+------------------+-----+----------+-------+  PTA      125               0.91 biphasic          +---------+------------------+-----+----------+-------+ PERO     92                0.67 monophasic        +---------+------------------+-----+----------+-------+ DP       100               0.72 biphasic          +---------+------------------+-----+----------+-------+ Great Toe64                0.46                   +---------+------------------+-----+----------+-------+ +-------+-----------+-----------+------------+------------+ ABI/TBIToday's ABIToday's TBIPrevious ABIPrevious TBI +-------+-----------+-----------+------------+------------+ Right  1.01       0.54       1.15        0.85         +-------+-----------+-----------+------------+------------+ Left   0.91       0.46       1.04        0.71         +-------+-----------+-----------+------------+------------+ Right ABIs appear essentially unchanged compared to prior study on  10/09/2021. Left ABIs appear decreased compared to prior study on 10/09/2021.  Summary: Right: Resting right ankle-brachial index is within normal range. The right toe-brachial index is abnormal. Left: Resting left ankle-brachial index indicates mild left lower extremity arterial disease. The left toe-brachial index is abnormal. *See table(s) above for measurements and observations.  Electronically signed by [name] MD on 02/16/2022 at 8:23:12 AM.    Final        Assessment & Plan:   1. 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  However he does have a noted narrowing in the left lower extremity.  He denies any significant issues however we will have him follow-up in 3 months just to keep a closer eye.  Patient advised however that if he begins to develop worsening claudication or rest pain like symptoms.  He should contact our office for sooner follow-up evaluation.  2. Essential hypertension with goal blood pressure less than 140/90 Continue antihypertensive medications as already ordered, these medications have been reviewed and there are no changes at this time.  3. Hyperlipidemia, mixed Continue statin as ordered and reviewed, no changes at this time   Current Outpatient Medications on File Prior to Visit  Medication Sig Dispense Refill   Adhesive Bandages (SM BANDAGES FABRIC) MISC Apply to wounds as needed.  1 3/4"x4" bandage [employer] by [employer] (Patient taking differently: Apply to wounds as needed.  1 3/4"x4" bandage [employer] by [employer]) 70 each 3   aspirin 81 MG tablet Take 81 mg by mouth daily.     atorvastatin (LIPITOR) 10 MG tablet Take 1 tablet (10 mg total) by mouth daily. 30 tablet 11   Carboxymethylcellulose Sodium 0.25 % SOLN Place 1 drop into both eyes 4 (four) times daily as needed (dry, irritated eyes.).     clopidogrel (PLAVIX) 75 MG  tablet Take 1 tablet (75 mg total) by mouth daily. 30 tablet 11   insulin glargine (LANTUS) 100 UNIT/ML injection Inject 60-80 Units into the skin daily.     Multiple Vitamins-Minerals (CENTRUM SILVER 50+MEN) TABS Take 1 tablet by mouth daily.  Semaglutide,0.25 or 0.5MG/DOS, (OZEMPIC, 0.25 OR 0.5 MG/DOSE,) 2 MG/3ML SOPN Inject 0.5 mg into the skin once a week. 3 mL 2   Silver (SILVRSTAT WOUND DRESSING) GEL Apply 1 application topically daily. (Patient not taking: Reported on 02/11/2022) 28.35 g 11   silver sulfADIAZINE (SILVADENE) 1 % cream Apply 1 application topically daily. (Patient not taking: Reported on 02/11/2022) 400 g 0   No current facility-administered medications on file prior to visit.    There are no Patient Instructions on file for this visit. No follow-ups on file.   Kris Hartmann, NP

## 2022-03-24 ENCOUNTER — Encounter: Payer: Self-pay | Admitting: Medical-Surgical

## 2022-03-24 ENCOUNTER — Ambulatory Visit (INDEPENDENT_AMBULATORY_CARE_PROVIDER_SITE_OTHER): Payer: Medicare HMO | Admitting: Medical-Surgical

## 2022-03-24 VITALS — BP 99/58 | HR 73 | Resp 20 | Ht 74.0 in | Wt 215.0 lb

## 2022-03-24 DIAGNOSIS — E782 Mixed hyperlipidemia: Secondary | ICD-10-CM

## 2022-03-24 DIAGNOSIS — I10 Essential (primary) hypertension: Secondary | ICD-10-CM

## 2022-03-24 DIAGNOSIS — H903 Sensorineural hearing loss, bilateral: Secondary | ICD-10-CM | POA: Insufficient documentation

## 2022-03-24 DIAGNOSIS — E1142 Type 2 diabetes mellitus with diabetic polyneuropathy: Secondary | ICD-10-CM

## 2022-03-24 DIAGNOSIS — E1165 Type 2 diabetes mellitus with hyperglycemia: Secondary | ICD-10-CM | POA: Insufficient documentation

## 2022-03-24 DIAGNOSIS — R600 Localized edema: Secondary | ICD-10-CM

## 2022-03-24 DIAGNOSIS — I455 Other specified heart block: Secondary | ICD-10-CM | POA: Insufficient documentation

## 2022-03-24 NOTE — Progress Notes (Signed)
Established Patient Office Visit  Subjective   Patient ID: KALADIN KOLTON, male   DOB: [birth year] Age: 77 y.o. MRN: [id number]   Chief Complaint  Patient presents with   Follow-up   Diabetes   HPI Pleasant 77 year old male presenting today for a general follow-up.  He is seen primarily at the [place] for most of his medical needs.  He was having an unpleasant experience until he got a new doctor recently.  The new doctor is working with him extensively to meet his medical needs.  He is very happy with his experience there.  He was originally started on Ozempic.  Our office however the new provider there has taken over that.  He is now monitoring his glucose with a continuous glucose monitor and reports that his fasting readings have been under 130 on most days.  He does splurge on his dietary intake every now and then however has made some significant changes to his diet and lifestyle routine.  Notes that he ended up going to the hospital and had a pacemaker placed and since then, he has been doing very well and is back to feeling energetic and performing activities that he had to put aside due to his help.  He is no longer on Lyrica due to lower extremity swelling and is very happy that his legs are the same size.  He is currently being followed by cardiology, vascular, and the [place].  Gets his medications from the [place] for free and has an upcoming appointment to recheck his hemoglobin A1c and other labs.   Objective:    Vitals:   03/24/22 0921  BP: 137/60  Pulse: 74  Resp: 20  Height: '6\' 2"'$  (1.88 m)  Weight: 215 lb (97.5 kg)  SpO2: 97%  BMI (Calculated): 27.59    Physical Exam Vitals reviewed.  Constitutional:      General: He is not in acute distress.    Appearance: Normal appearance. He is not ill-appearing.  HENT:     Head: Normocephalic.  Cardiovascular:     Rate and Rhythm: Normal rate.     Pulses: Normal pulses.     Heart sounds: Normal heart sounds. No murmur heard.    No friction  rub. No gallop.  Pulmonary:     Effort: Pulmonary effort is normal. No respiratory distress.     Breath sounds: Normal breath sounds.  Skin:    General: Skin is warm and dry.  Neurological:     Mental Status: He is alert and oriented to person, place, and time.  Psychiatric:        Mood and Affect: Mood normal.        Behavior: Behavior normal.        Thought Content: Thought content normal.        Judgment: Judgment normal.   No results found for this or any previous visit (from the past 24 hour(s)).     The ASCVD Risk score (Arnett DK, et al., 2019) failed to calculate for the following reasons:   The patient has a prior MI or stroke diagnosis   Assessment & Plan:   1. Type 2 diabetes mellitus with diabetic polyneuropathy, without long-term current use of insulin ([place]) Now being managed by the [place].  Discussed continued vigilance over dietary intake.  Recommend he limit splurging and when he does choose to eat things that are not compliant with a diabetic diet, he should eat very small amounts.  2. Essential hypertension with goal blood  pressure less than 140/90 Blood pressure looks great today.  This is managed by cardiology in the [place].  No changes needed.  3. Hyperlipidemia, mixed Continue atorvastatin.  4. Edema of left foot Resolved.   Return in about 3 months (around 06/24/2022) for chronic disease follow up.  ___________________________________________ Clearnce Sorrel, DNP, APRN, FNP-BC [facility] [place]

## 2022-03-30 DIAGNOSIS — Z950 Presence of cardiac pacemaker: Secondary | ICD-10-CM | POA: Insufficient documentation

## 2022-04-28 ENCOUNTER — Telehealth: Payer: Self-pay | Admitting: Medical-Surgical

## 2022-04-28 NOTE — Telephone Encounter (Signed)
Excellent.  Thank you 

## 2022-04-28 NOTE — Telephone Encounter (Signed)
Pt wanted to inform you that his A1C has dropped to 6.5 due to the [place] prescribing him Ozempic.

## 2022-04-29 ENCOUNTER — Other Ambulatory Visit (INDEPENDENT_AMBULATORY_CARE_PROVIDER_SITE_OTHER): Payer: Self-pay | Admitting: Nurse Practitioner

## 2022-04-29 DIAGNOSIS — Z9889 Other specified postprocedural states: Secondary | ICD-10-CM

## 2022-05-08 ENCOUNTER — Ambulatory Visit (INDEPENDENT_AMBULATORY_CARE_PROVIDER_SITE_OTHER): Payer: Medicare HMO | Admitting: Vascular Surgery

## 2022-05-08 ENCOUNTER — Ambulatory Visit (INDEPENDENT_AMBULATORY_CARE_PROVIDER_SITE_OTHER): Payer: Medicare HMO

## 2022-05-08 ENCOUNTER — Encounter (INDEPENDENT_AMBULATORY_CARE_PROVIDER_SITE_OTHER): Payer: Self-pay | Admitting: Vascular Surgery

## 2022-05-08 VITALS — BP 121/67 | HR 63 | Resp 18 | Ht 74.0 in | Wt 216.4 lb

## 2022-05-08 DIAGNOSIS — Z9889 Other specified postprocedural states: Secondary | ICD-10-CM

## 2022-05-08 DIAGNOSIS — I6523 Occlusion and stenosis of bilateral carotid arteries: Secondary | ICD-10-CM

## 2022-05-08 DIAGNOSIS — I10 Essential (primary) hypertension: Secondary | ICD-10-CM

## 2022-05-08 DIAGNOSIS — E1142 Type 2 diabetes mellitus with diabetic polyneuropathy: Secondary | ICD-10-CM | POA: Diagnosis not present

## 2022-05-08 DIAGNOSIS — E782 Mixed hyperlipidemia: Secondary | ICD-10-CM

## 2022-05-08 DIAGNOSIS — I739 Peripheral vascular disease, unspecified: Secondary | ICD-10-CM

## 2022-05-08 DIAGNOSIS — I7025 Atherosclerosis of native arteries of other extremities with ulceration: Secondary | ICD-10-CM | POA: Diagnosis not present

## 2022-05-08 NOTE — Assessment & Plan Note (Signed)
blood pressure control important in reducing the progression of atherosclerotic disease. On appropriate oral medications.  

## 2022-05-08 NOTE — Assessment & Plan Note (Addendum)
His ABIs today show ABIs of greater than 1 bilaterally with multiphasic waveforms and normal digital pressures consistent with no arterial insufficiency after right lower extremity revascularization.  Duplex shows intervention to be patent with only some mild tibial level disease.  Doing great clinically.  Continue current medical regimen.  Recheck in 6 months with noninvasive studies.

## 2022-05-08 NOTE — Assessment & Plan Note (Signed)
Has not been checked in some time.  When he returns in follow-up of his PAD, check carotid duplex as well.

## 2022-05-08 NOTE — Assessment & Plan Note (Signed)
blood glucose control important in reducing the progression of atherosclerotic disease. Also, involved in wound healing. On appropriate medications.  

## 2022-05-08 NOTE — Assessment & Plan Note (Signed)
lipid control important in reducing the progression of atherosclerotic disease. Continue statin therapy  

## 2022-05-08 NOTE — Progress Notes (Signed)
MRN : [id number]  Matthew [name] is a 77 y.o. ([birth year]) male who presents with chief complaint of  Chief Complaint  Patient presents with   Follow-up    3 month ABI + LE art dup  .  History of Present Illness: Patient returns today in follow up of his peripheral artery disease.  He is doing well today.  His ulcer on his left foot has healed after having it for almost 6 years.  We performed left lower extremity revascularization last year.  He is also received a pacemaker since I last saw him.  He had what sounds like complete heart block.  He says he is feeling great and doing very well.  He is having no lifestyle limiting claudication, ischemic rest pain, or ulceration.  His ABIs today show ABIs of greater than 1 bilaterally with multiphasic waveforms and normal digital pressures consistent with no arterial insufficiency after right lower extremity revascularization.  Duplex shows intervention to be patent with only some mild tibial level disease.  Current Outpatient Medications  Medication Sig Dispense Refill   aspirin 81 MG tablet Take 81 mg by mouth daily.     atorvastatin (LIPITOR) 10 MG tablet Take 1 tablet (10 mg total) by mouth daily. 30 tablet 11   Carboxymethylcellulose Sodium 0.25 % SOLN Place 1 drop into both eyes 4 (four) times daily as needed (dry, irritated eyes.).     clopidogrel (PLAVIX) 75 MG tablet Take 1 tablet (75 mg total) by mouth daily. 30 tablet 11   insulin glargine (LANTUS) 100 UNIT/ML injection Inject 60-80 Units into the skin daily.     lisinopril (ZESTRIL) 2.5 MG tablet Take 2.5 mg by mouth daily.     Multiple Vitamins-Minerals (CENTRUM SILVER 50+MEN) TABS Take 1 tablet by mouth daily.     Semaglutide,0.25 or 0.5MG /DOS, (OZEMPIC, 0.25 OR 0.5 MG/DOSE,) 2 MG/3ML SOPN Inject 0.5 mg into the skin once a week. 3 mL 2   No current facility-administered medications for this visit.    Past Medical History:  Diagnosis Date   Acute upper respiratory infection  02/18/2014   Allergy    Arthritis    Carotid artery disease 05/11/2013   Chicken pox as a child   Contact with powered lawnmower as cause of accidental injury at home as place of occurrence 10/03/2014   Diabetes mellitus without complication    Fall    every since his stroke   GERD (gastroesophageal reflux disease)    Measles    Other and unspecified hyperlipidemia 05/11/2013   Peripheral neuropathy    Poor dental hygiene 05/11/2013   Shingles 77 yrs old   Sleep apnea    uses CPAP, [facility] by [facility]   Stroke 2007   Vocal cord dysfunction     Past Surgical History:  Procedure Laterality Date   APPENDECTOMY  2000   ruptured   CAROTID ENDARTERECTOMY  09/2010   EYE SURGERY  2010   cataracts bilateral   IR RADIOLOGIST EVAL & MGMT  06/08/2018   LOWER EXTREMITY ANGIOGRAPHY Left 09/08/2021   Procedure: Lower Extremity Angiography;  Surgeon: Annice Needy, MD;  Location: [facility];  Service: Cardiovascular;  Laterality: Left;     Social History   Tobacco Use   Smoking status: Some Days    Types: Cigars    Start date: 11/27/1997   Smokeless tobacco: Never  Vaping Use   Vaping Use: Never used  Substance Use Topics   Alcohol use: Yes  Alcohol/week: 0.0 standard drinks of alcohol    Comment: occasionally drinks wine    Drug use: No       Family History  Problem Relation Age of Onset   Cancer Mother        kidney   Heart attack Father    Stroke Brother    Heart attack Brother    Stroke Brother      No Known Allergies   REVIEW OF SYSTEMS (Negative unless checked)  Constitutional: Weight loss  Fever  Chills Cardiac: Chest pain   Chest pressure   Palpitations   Shortness of breath when laying flat   Shortness of breath at rest   Shortness of breath with exertion. Vascular:  Pain in legs with walking   Pain in legs at rest   Pain in legs when laying flat   Claudication   Pain in feet when walking  Pain in feet at rest  Pain in feet  when laying flat   History of DVT   Phlebitis   Swelling in legs   Varicose veins   Non-healing ulcers Pulmonary:   Uses home oxygen   Productive cough   Hemoptysis   Wheeze  COPD   Asthma Neurologic:  Dizziness  Blackouts   Seizures   History of stroke   History of TIA  Aphasia   Temporary blindness   Dysphagia   Weakness or numbness in arms   Weakness or numbness in legs Musculoskeletal:  Arthritis   Joint swelling   Joint pain   Low back pain Hematologic:  Easy bruising  Easy bleeding   Hypercoagulable state   Anemic   Gastrointestinal:  Blood in stool   Vomiting blood  Gastroesophageal reflux/heartburn   Abdominal pain Genitourinary:  Chronic kidney disease   Difficult urination  Frequent urination  Burning with urination   Hematuria Skin:  Rashes   Ulcers   Wounds Psychological:  History of anxiety    History of major depression.  Physical Examination  BP 121/67 (BP Location: Right Arm)   Pulse 63   Resp 18   Ht  (1.88 m)   Wt 216 lb 6.4 oz (98.2 kg)   BMI 27.78 kg/m  Gen:  WD/WN, NAD Head: [place]/AT, No temporalis wasting. Ear/Nose/Throat: Hearing grossly intact, nares w/o erythema or drainage Eyes: Conjunctiva clear. Sclera non-icteric Neck: Supple.  Trachea midline Pulmonary:  Good air movement, no use of accessory muscles.  Cardiac: RRR, no JVD Vascular:  Vessel Right Left  Radial Palpable Palpable                          PT Palpable Palpable  DP Palpable Palpable   Gastrointestinal: soft, non-tender/non-distended. No guarding/reflex.  Musculoskeletal: M/S 5/5 throughout.  No deformity or atrophy.  Previous ulceration on the left foot has only a small scab residual.  No significant lower extremity edema. Neurologic: Sensation grossly intact in extremities.  Symmetrical.  Speech is fluent.  Psychiatric: Judgment intact, Mood & affect appropriate for pt's clinical  situation. Dermatologic: No rashes or ulcers noted.  No cellulitis or open wounds.      Labs Recent Results (from the past 2160 hour(s))  VAS [place] ABI WITH/WO TBI     Status: None   Collection Time: 02/11/22  1:59 PM  Result Value Ref Range   Right ABI 1.01    Left ABI 0.91     Radiology No results found.  Assessment/Plan  Atherosclerosis of native arteries  of the extremities with ulceration (HCC) His ABIs today show ABIs of greater than 1 bilaterally with multiphasic waveforms and normal digital pressures consistent with no arterial insufficiency after right lower extremity revascularization.  Duplex shows intervention to be patent with only some mild tibial level disease.  Doing great clinically.  Continue current medical regimen.  Recheck in 6 months with noninvasive studies.  Essential hypertension with goal blood pressure less than 140/90 blood pressure control important in reducing the progression of atherosclerotic disease. On appropriate oral medications.   Type 2 diabetes mellitus with diabetic polyneuropathy (HCC) blood glucose control important in reducing the progression of atherosclerotic disease. Also, involved in wound healing. On appropriate medications.   Hyperlipidemia, mixed lipid control important in reducing the progression of atherosclerotic disease. Continue statin therapy   Carotid artery disease Has not been checked in some time.  When he returns in follow-up of his PAD, check carotid duplex as well.    Festus Barren, MD  05/08/2022 1:38 PM    This note was created with Dragon medical transcription system.  Any errors from dictation are purely unintentional

## 2022-05-11 DIAGNOSIS — Z950 Presence of cardiac pacemaker: Secondary | ICD-10-CM | POA: Diagnosis not present

## 2022-05-11 DIAGNOSIS — Z45018 Encounter for adjustment and management of other part of cardiac pacemaker: Secondary | ICD-10-CM | POA: Diagnosis not present

## 2022-05-11 DIAGNOSIS — I442 Atrioventricular block, complete: Secondary | ICD-10-CM | POA: Diagnosis not present

## 2022-05-11 LAB — VAS US ABI WITH/WO TBI
Left ABI: 1.06
Right ABI: 1.17

## 2022-06-25 ENCOUNTER — Encounter: Payer: Self-pay | Admitting: Medical-Surgical

## 2022-06-25 ENCOUNTER — Ambulatory Visit (INDEPENDENT_AMBULATORY_CARE_PROVIDER_SITE_OTHER): Payer: Medicare HMO | Admitting: Medical-Surgical

## 2022-06-25 VITALS — BP 136/54 | HR 68 | Resp 20 | Ht 74.0 in | Wt 209.0 lb

## 2022-06-25 DIAGNOSIS — L89893 Pressure ulcer of other site, stage 3: Secondary | ICD-10-CM

## 2022-06-25 DIAGNOSIS — I442 Atrioventricular block, complete: Secondary | ICD-10-CM

## 2022-06-25 DIAGNOSIS — J301 Allergic rhinitis due to pollen: Secondary | ICD-10-CM | POA: Insufficient documentation

## 2022-06-25 DIAGNOSIS — S61412A Laceration without foreign body of left hand, initial encounter: Secondary | ICD-10-CM

## 2022-06-25 DIAGNOSIS — C61 Malignant neoplasm of prostate: Secondary | ICD-10-CM | POA: Diagnosis not present

## 2022-06-25 NOTE — Progress Notes (Signed)
        Established patient visit  History, exam, impression, and plan:  1. Skin tear of hand without complication, left, initial encounter Plan 77 year old male presenting today with a skin tear of the dorsal left hand.  Reports that he was removing the backseat from a new [employer] that he got and they twisted, catching his hand and causing a significant skin tear with associated bruising.  Has been performing self-care at home over the past few days.  Using Neosporin topically and covering with a sterile Band-Aid.  Did not seek medical care at the time of his injury.  Today he reports that the wound is looking much better than it was and seems to be healing appropriately.  I did remove the Band-Aid for exam showing a full-thickness skin tear in an irregular pattern covering approximately 1/3 of the hand.  Wound base pink without signs of infection.  Loose skin noted around the wound borders.  Recommend continued home care with washing at least once daily with warm soapy water and keeping covered with a sterile bandage.  Okay to use Neosporin for up to 3 days.  Xeroform placed on the wound in office and recovered with a sterile bandage.  Last tetanus shot completed in 2016.  Offered update today however patient declined.  2. Pressure injury of toe of left foot, stage 3 (HCC) Resolved.  3. Complete heart block (HCC) Managed by cardiology at the [facility].  4. Local recurrence of prostate cancer (HCC) Managed by the [facility].   Procedures performed this visit: None.  Return in about 6 months (around 12/25/2022) for chronic disease follow up.  __________________________________ Thayer Ohm, DNP, APRN, FNP-BC Primary Care and Sports Medicine [facility] [place]

## 2022-06-29 ENCOUNTER — Telehealth: Payer: Self-pay | Admitting: *Deleted

## 2022-06-29 ENCOUNTER — Other Ambulatory Visit: Payer: Self-pay | Admitting: Medical-Surgical

## 2022-06-29 DIAGNOSIS — Z59811 Housing instability, housed, with risk of homelessness: Secondary | ICD-10-CM

## 2022-06-29 NOTE — Progress Notes (Unsigned)
  Care Coordination  Outreach Note  06/29/2022 Name: MAVRYK PINO MRN: [id number] DOB: Jun 24, 1945   Care Coordination Outreach Attempts: An unsuccessful telephone outreach was attempted today to offer the patient information about available care coordination services.  Follow Up Plan:  Additional outreach attempts will be made to offer the patient care coordination information and services.   Encounter Outcome:  No Answer  Burman Nieves, CCMA Care Coordination Care Guide Direct Dial: [phone]

## 2022-06-30 NOTE — Progress Notes (Unsigned)
  Care Coordination  Outreach Note  06/30/2022 Name: Matthew [name] MRN: [id number] DOB: 1945/03/03   Care Coordination Outreach Attempts: A second unsuccessful outreach was attempted today to offer the patient with information about available care coordination services.  Follow Up Plan:  Additional outreach attempts will be made to offer the patient care coordination information and services.   Encounter Outcome:  No Answer  Burman Nieves, CCMA Care Coordination Care Guide Direct Dial: [phone]

## 2022-07-02 NOTE — Progress Notes (Signed)
  Care Coordination  Outreach Note  07/02/2022 Name: Matthew [name] MRN: [id number] DOB: 07-25-1945   Care Coordination Outreach Attempts: A third unsuccessful outreach was attempted today to offer the patient with information about available care coordination services.  Follow Up Plan:  No further outreach attempts will be made at this time. We have been unable to contact the patient to offer or enroll patient in care coordination services  Encounter Outcome:  No Answer  Burman Nieves, [facility] Care Coordination Care Guide Direct Dial: [phone]

## 2022-07-06 ENCOUNTER — Ambulatory Visit (INDEPENDENT_AMBULATORY_CARE_PROVIDER_SITE_OTHER): Payer: Medicare HMO | Admitting: Medical-Surgical

## 2022-07-06 DIAGNOSIS — H6993 Unspecified Eustachian tube disorder, bilateral: Secondary | ICD-10-CM

## 2022-07-06 DIAGNOSIS — N3001 Acute cystitis with hematuria: Secondary | ICD-10-CM

## 2022-07-06 MED ORDER — AMOXICILLIN-POT CLAVULANATE 875-125 MG PO TABS: 1.0000 | ORAL_TABLET | Freq: Two times a day (BID) | ORAL | 0 refills | Status: DC

## 2022-07-06 NOTE — Progress Notes (Signed)
        Established patient visit  History, exam, impression, and plan:  1. Acute cystitis with hematuria Pleasant 77 year old male presenting today to our front desk with reports of emergent concerns.  He did not have an appointment however we were able to work him in today.  Reports that he has had urinary incontinence over the last 2 weeks that has progressively worsened.  This is new onset for him and he is now having pain at the end of urination as well as cloudy foul-smelling urine.  Has noted blood in his urine once over the last several days.  Has not taken any medications for his symptoms or tried any home remedies.  Denies fever, chills, nausea, vomiting, back/flank pain.  Unfortunately, he is unable to void for [place] today as he went to the bathroom shortly before being roomed.  Overall exam benign with negative CVA tenderness bilaterally.  Pressure/mild tenderness over the lower abdomen/suprapubic area.  With his symptoms, treating empirically with Augmentin twice daily x 10 days.  If symptoms persist after completion of the antibiotic course, we will need a urine specimen to send for culture.  2. Dysfunction of both eustachian tubes Has had issues with bilateral hearing changes.  Normally wears hearing aids however he reports that his ears will not pop and that makes it difficult to wear his hearing aids effectively.  Reports that the only time his ears pop now is if he goes into higher altitudes such as at [employer].  Was able to see someone at urgent care who prescribed a nasal steroid spray.  That was ineffective so he went back and was prescribed Singulair.  He received this in the mail and was very upset because they did not send it to a local pharmacy.  He did not open the prescription for Singulair and has not started it.  On evaluation, bilateral serous middle ear effusions noted without signs of infection.  Recommend continuing steroid nasal spray.  Okay to start Singulair 10 mg  nightly as this will likely benefit.  Avoiding steroids in the setting of type 2 diabetes.  Procedures performed this visit: None.  Return if symptoms worsen or fail to improve.  __________________________________ Thayer Ohm, DNP, APRN, FNP-BC Primary Care and Sports Medicine [facility] [place]

## 2022-07-08 ENCOUNTER — Encounter: Payer: Self-pay | Admitting: Family Medicine

## 2022-07-08 ENCOUNTER — Telehealth: Payer: Self-pay | Admitting: Medical-Surgical

## 2022-07-08 ENCOUNTER — Encounter: Payer: Self-pay | Admitting: Medical-Surgical

## 2022-07-08 ENCOUNTER — Telehealth: Payer: Self-pay

## 2022-07-08 MED ORDER — SULFAMETHOXAZOLE-TRIMETHOPRIM 800-160 MG PO TABS: 1.0000 | ORAL_TABLET | Freq: Two times a day (BID) | ORAL | 0 refills | Status: DC

## 2022-07-08 NOTE — Telephone Encounter (Signed)
Pt called called again. Rx should be sent to [place].

## 2022-07-08 NOTE — Telephone Encounter (Signed)
Patient called and states the amoxicillin is making him sick and wanted something else sent please advise.

## 2022-07-08 NOTE — Telephone Encounter (Signed)
Patient called stating his prescription of amoxicillin-clavulanate (AUGMENTIN) 875-125 MG tablet [[id number]]  is currently making him sick.   He stated he has been throwing up since early this morning.   Patient would like to know if there is any alternatives.

## 2022-07-09 MED ORDER — SULFAMETHOXAZOLE-TRIMETHOPRIM 800-160 MG PO TABS: 1.0000 | ORAL_TABLET | Freq: Two times a day (BID) | ORAL | 0 refills | Status: DC

## 2022-07-09 NOTE — Addendum Note (Signed)
Addended by: Chalmers Cater on: 07/09/2022 10:07 AM   Modules accepted: Orders

## 2022-09-07 ENCOUNTER — Ambulatory Visit: Payer: Medicare HMO | Admitting: Medical-Surgical

## 2022-09-07 ENCOUNTER — Encounter: Payer: Self-pay | Admitting: Medical-Surgical

## 2022-09-07 VITALS — BP 120/63 | HR 77 | Resp 20 | Ht 74.0 in | Wt 208.0 lb

## 2022-09-07 DIAGNOSIS — E11621 Type 2 diabetes mellitus with foot ulcer: Secondary | ICD-10-CM | POA: Diagnosis not present

## 2022-09-07 DIAGNOSIS — L97412 Non-pressure chronic ulcer of right heel and midfoot with fat layer exposed: Secondary | ICD-10-CM

## 2022-09-07 MED ORDER — DOXYCYCLINE HYCLATE 100 MG PO TABS: 100.0000 mg | ORAL_TABLET | Freq: Two times a day (BID) | ORAL | 0 refills | Status: AC

## 2022-09-07 NOTE — Progress Notes (Signed)
        Established patient visit  History, exam, impression, and plan:  1. Diabetic ulcer of right heel associated with type 2 diabetes mellitus, with fat layer exposed (HCC) 77 year old male with a history of type 2 diabetes presenting today with reports of a blister that he found about 5 days ago on the medial right foot just below the ankle.  Reports that the blister was filled with clear fluid when he noticed it.  He and his significant other used a sterilized needle to poke a hole in it and drained the fluid out.  Since then the top layer of the blister has been completely removed.  Has been keeping it covered with a bandage and has been using Neosporin.  Also notes that his right middle toe/toenail has a wound that occurred when he tried wearing a pair of crocs.  Has stopped wearing the shoes and will be looking for some to replace them with.  See clinical photos below.  Xeroform placed to the large blister, covered by gauze, and secured with Coban.  Would like his home regimen to be daily site care with warm soapy water, rinse thoroughly and pat dry.  Apply Silvadene cream to the wounds, cover with gauze and secure in place with Coban.  Since there is some surrounding erythema, treating with doxycycline 100 mg twice daily x 7 days.  Plan to monitor wound for appropriate healing.  Discussed recommendations to return for any complications or delayed healing of either site.        Procedures performed this visit: None.  Return if symptoms worsen or fail to improve.  Keep appointment scheduled for December.  __________________________________ Thayer Ohm, DNP, APRN, FNP-BC Primary Care and Sports Medicine [facility] [place]

## 2022-09-08 ENCOUNTER — Other Ambulatory Visit: Payer: Self-pay | Admitting: Medical-Surgical

## 2022-09-08 MED ORDER — SILVER SULFADIAZINE 1 % EX CREA: 1.0000 | TOPICAL_CREAM | Freq: Every day | CUTANEOUS | 1 refills | Status: AC

## 2022-09-25 LAB — HM DIABETES EYE EXAM

## 2022-10-09 ENCOUNTER — Telehealth: Payer: Self-pay | Admitting: Family Medicine

## 2022-10-09 NOTE — Telephone Encounter (Signed)
Please schedule

## 2022-10-15 LAB — PSA: PSA: 0.04

## 2022-10-15 LAB — BASIC METABOLIC PANEL WITH GFR
BUN: 12 (ref 4–21)
CO2: 26 — AB (ref 13–22)
Chloride: 105 (ref 99–108)
Creatinine: 1.2 (ref 0.6–1.3)
Glucose: 166
Potassium: 4.5 meq/L (ref 3.5–5.1)
Sodium: 141 (ref 137–147)

## 2022-10-15 LAB — PROTEIN / CREATININE RATIO, URINE
Albumin, U: 5
Creatinine, Urine: 138

## 2022-10-15 LAB — LIPID PANEL
Cholesterol: 151 (ref 0–200)
HDL: 40 (ref 35–70)
LDL Cholesterol: 83
Triglycerides: 283 — AB (ref 40–160)

## 2022-10-15 LAB — HM DIABETES EYE EXAM

## 2022-10-15 LAB — HEMOGLOBIN A1C
Alb/Creat Ratio, Ur: 32.6 mg/g{creat}
Albumin, Urine POC: 4.5
Creatinine, POC: 138 mg/dL
EGFR (Non-African Amer.): 64
Hemoglobin A1C: 6.8
PSA, Total: 0.04

## 2022-10-15 LAB — HEPATIC FUNCTION PANEL
ALT: 20 U/L (ref 10–40)
AST: 35 (ref 14–40)
Alkaline Phosphatase: 70 (ref 25–125)
Bilirubin, Direct: 0.2
Bilirubin, Total: 1

## 2022-10-15 LAB — COMPREHENSIVE METABOLIC PANEL WITH GFR
Calcium: 9.8 (ref 8.7–10.7)
eGFR: 64

## 2022-10-15 LAB — MICROALBUMIN, URINE: Microalb, Ur: 4.5

## 2022-10-15 LAB — MICROALBUMIN / CREATININE URINE RATIO: Microalb Creat Ratio: 32.6

## 2022-10-18 IMAGING — DX DG CHEST 1V PORT
1 series · 1 of 1 positions shown · non-contrast
Comparison: None.

CLINICAL DATA: Chest pain

EXAM:
PORTABLE CHEST 1 VIEW

[chest]
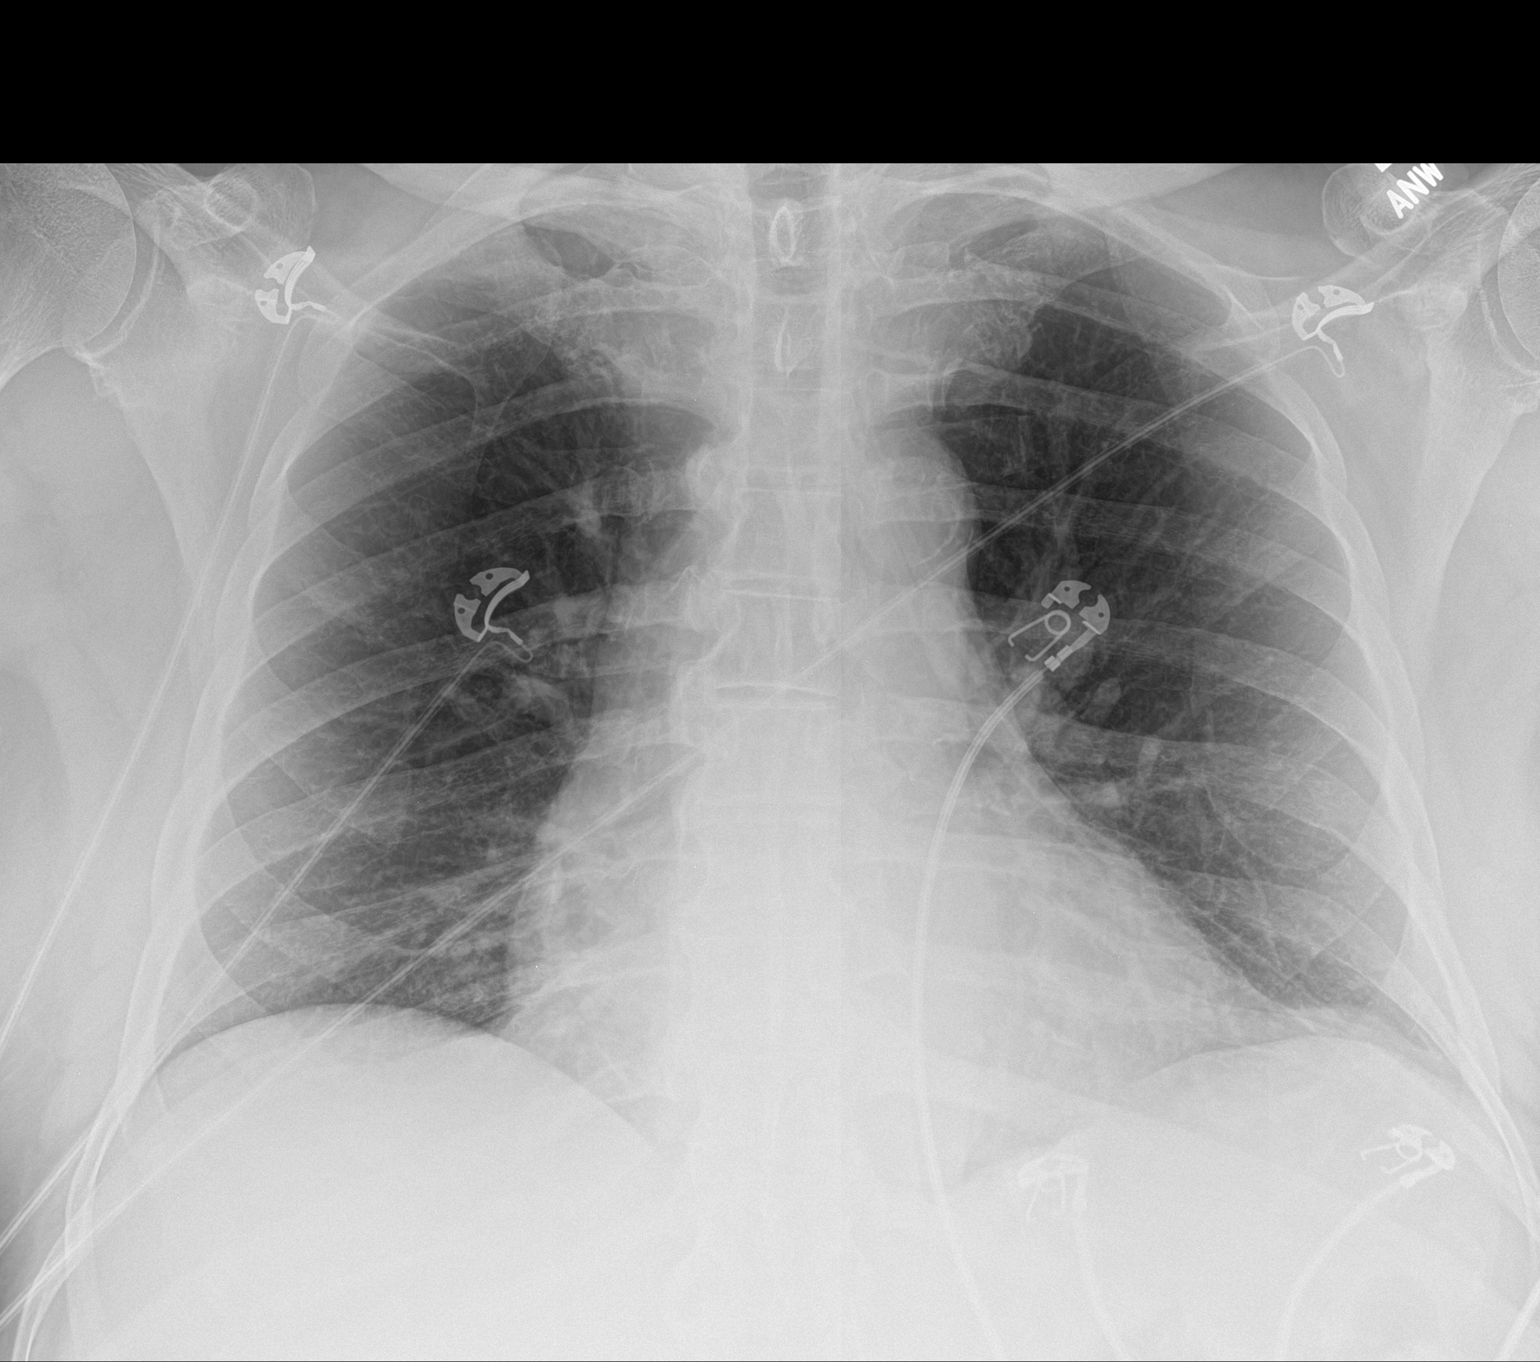

[1 of 1 positions shown; findings below may reference images not displayed]

FINDINGS: Heart is normal size. No confluent airspace opacities or effusions.
No acute bony abnormality. Scattered aortic calcifications.
IMPRESSION: No active disease.

## 2022-10-18 IMAGING — CT CT ANGIO CHEST-ABD-PELV FOR DISSECTION W/ AND WO/W CM
2 of 7 series · 14 of 46 positions shown, 16 images · IV contrast (OMNI 350)
Comparison: May 13, 2018.

CLINICAL DATA: Thoracic aortic aneurysm (TIGER) suspected

EXAM:
CT ANGIOGRAPHY CHEST, ABDOMEN AND PELVIS
TECHNIQUE: Non-contrast CT of the chest was initially obtained.

[Series 5: dissection 2mm · axial · 0.98mm/px · z∈[+1103,+1749]mm · 11 of 365 slices shown, 13 images]
[im 21/365  soft-tissue]
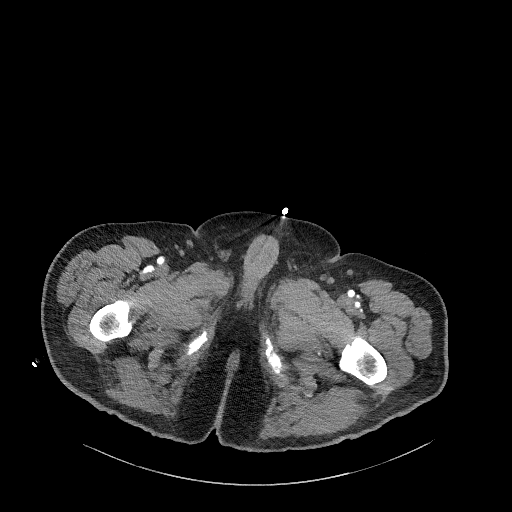
[im 21/365  bone]
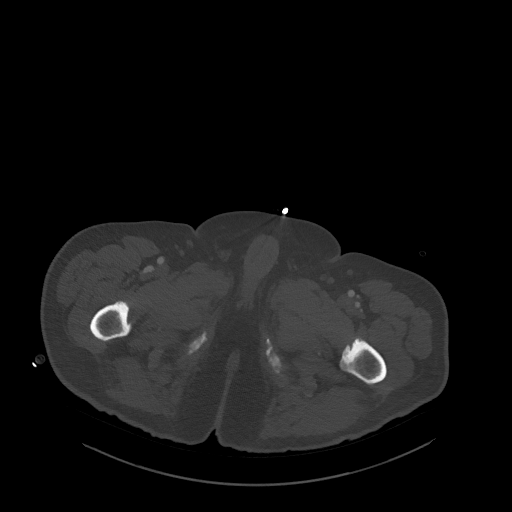
[im 61/365  soft-tissue]
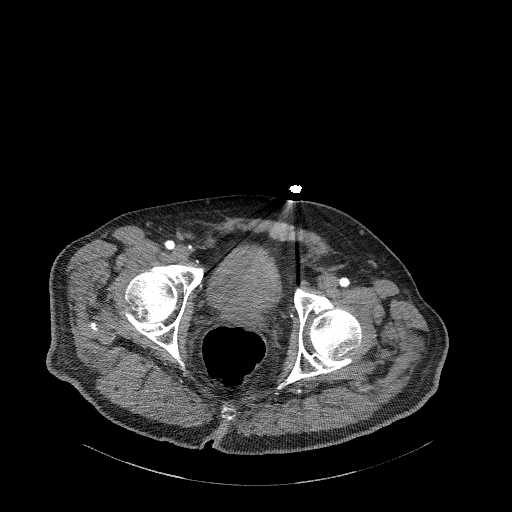
[im 81/365  soft-tissue]
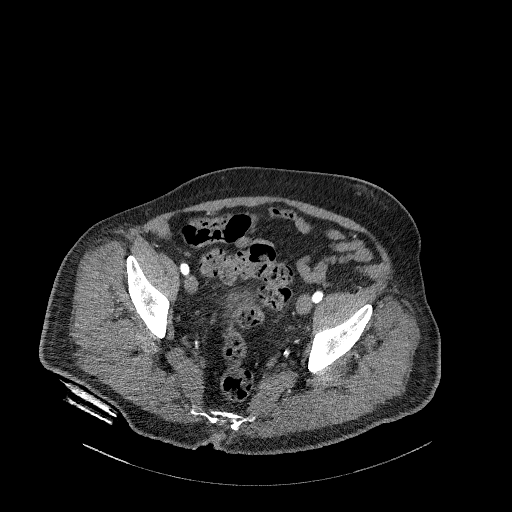
[im 122/365  soft-tissue]
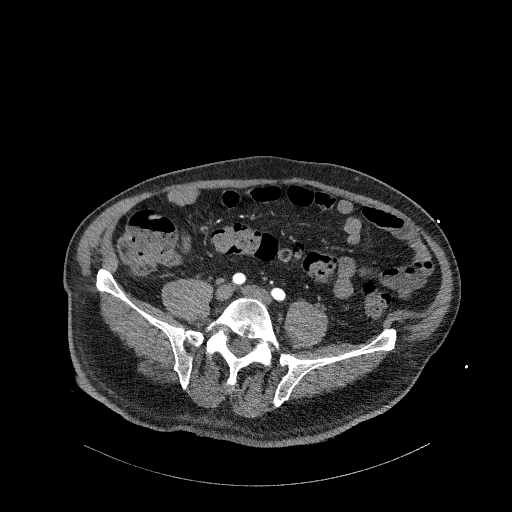
[im 142/365  soft-tissue]
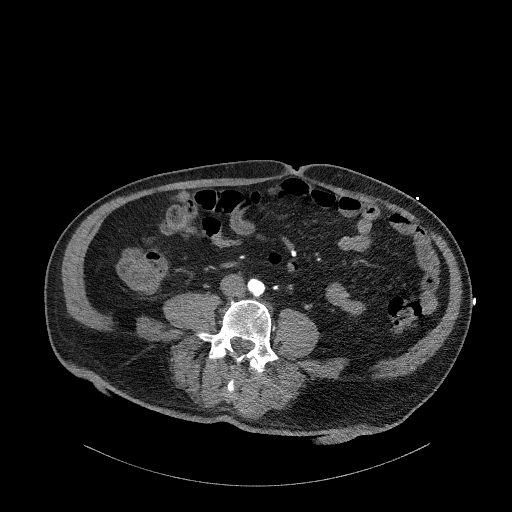
[im 183/365  soft-tissue]
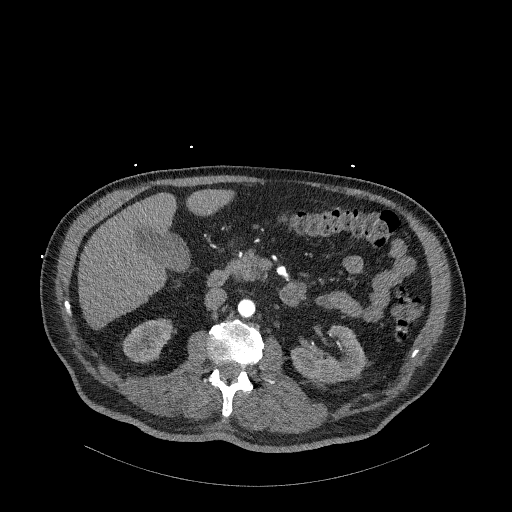
[im 223/365  soft-tissue]
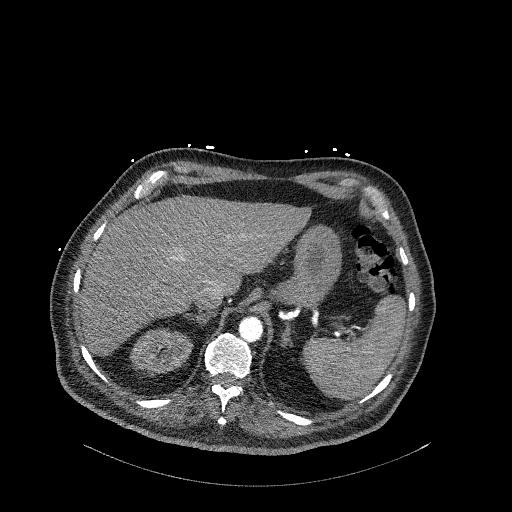
[im 243/365  soft-tissue]
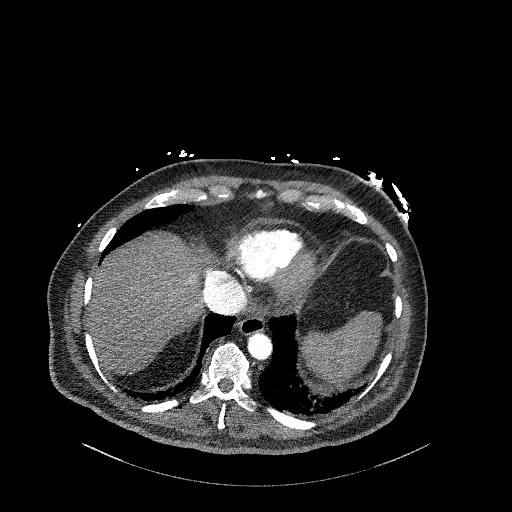
[im 284/365  soft-tissue]
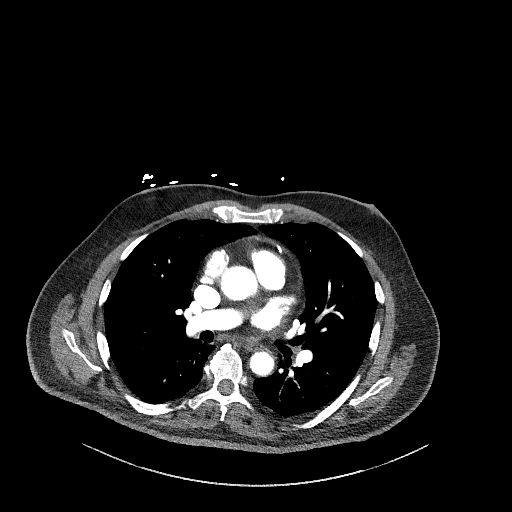
[im 284/365  bone]
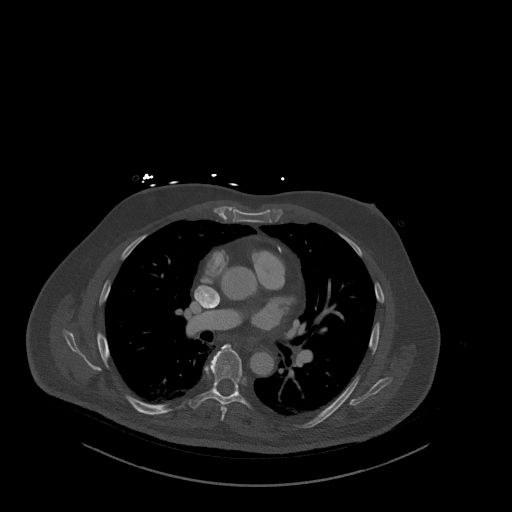
[im 304/365  soft-tissue]
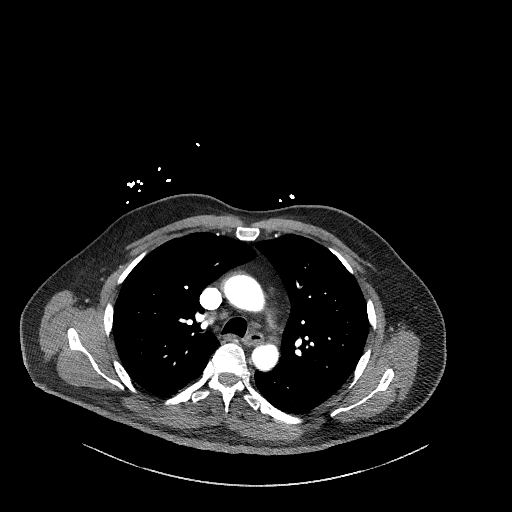
[im 344/365  soft-tissue]
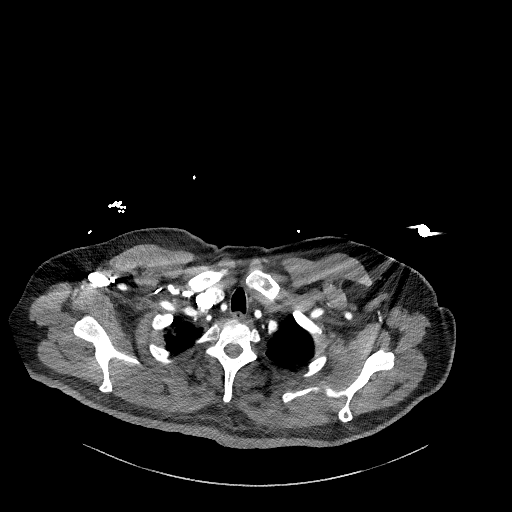

[Series 10: dissection 2mm cor · coronal · 1.05mm/px · 3 of 151 slices shown]
[im 38/151  soft-tissue]
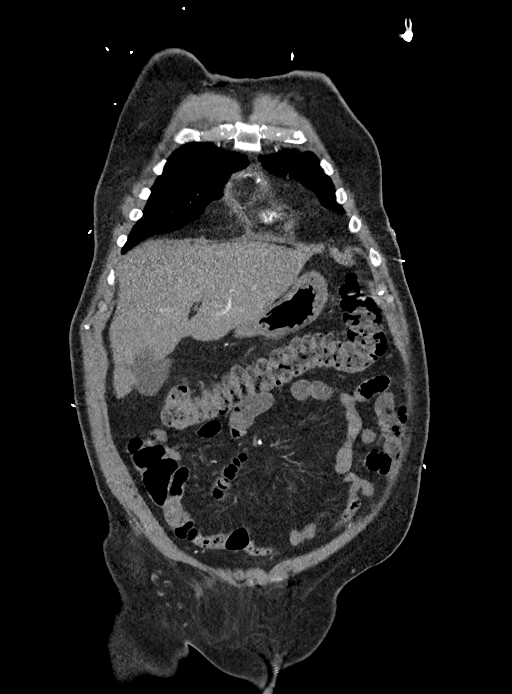
[im 76/151  soft-tissue]
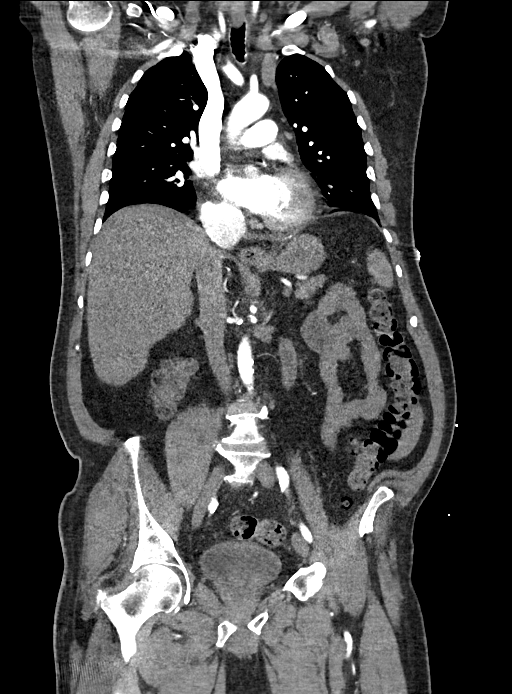
[im 113/151  soft-tissue]
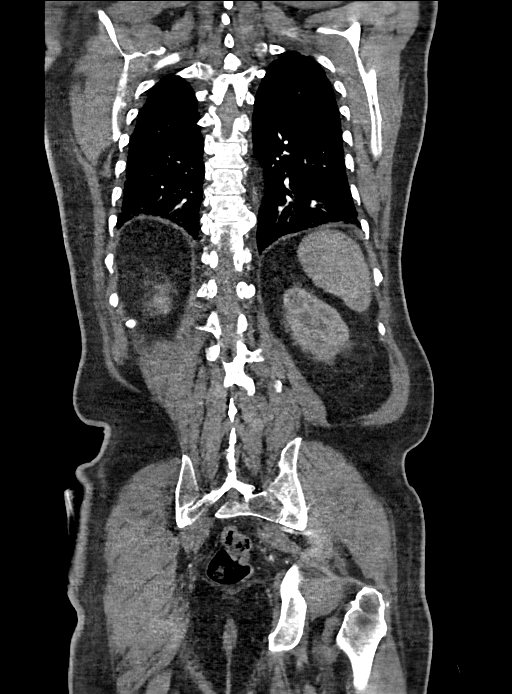

[14 of 46 positions shown; findings below may reference images not displayed]

Multidetector CT imaging through the chest, abdomen and pelvis was
performed using the standard protocol during bolus administration of
intravenous contrast. Multiplanar reconstructed images and MIPs were
obtained and reviewed to evaluate the vascular anatomy.

CONTRAST:  100mL OMNIPAQUE IOHEXOL 350 MG/ML SOLN
FINDINGS: CTA CHEST FINDINGS

Cardiovascular: Preferential opacification of the thoracic aorta. No
evidence of thoracic aortic aneurysm or dissection. Enlarged heart
size. Atherosclerotic calcifications of the aorta. Three-vessel
coronary artery atherosclerotic calcifications. Coarse
calcifications of the pericardium predominately anteriorly. Trace
pericardial fluid.

Mediastinum/Nodes: Thyroid is unremarkable. No axillary or
mediastinal adenopathy.

Lungs/Pleura: No pleural effusion or pneumothorax. Trachea is
patent. Scattered bibasilar atelectasis.

There are scattered pulmonary nodules as follows:

Nodule 1: RIGHT upper lobe pulmonary nodule measures 3 mm (series 9,
image 59).

Nodule 2: LEFT upper lobe pulmonary nodule measures 2 mm (series 9,
image 47).

Musculoskeletal: No chest wall abnormality. No acute or significant
osseous findings.

Review of the MIP images confirms the above findings.

CTA ABDOMEN AND PELVIS FINDINGS

VASCULAR

Aorta: Normal caliber aorta without aneurysm, dissection, vasculitis
or significant stenosis. Moderate soft and calcified plaque
throughout its course.

Celiac: Patent.

SMA: Patent. Atherosclerotic calcifications at the origin resulted
in moderate narrowing of the origin.

Renals: Patent atherosclerotic plaque of the origin of the RIGHT
renal artery results in moderate narrowing.

IMA: Patent

Inflow: Patent with scattered atherosclerotic plaque.

Veins: No obvious venous abnormality within the limitations of this
arterial phase study.

Review of the MIP images confirms the above findings.

NON-VASCULAR

Hepatobiliary: Mildly nodular contour of the liver. Gallbladder is
unremarkable. Relative hypertrophy of the LEFT liver.

Pancreas: Unremarkable. No pancreatic ductal dilatation or
surrounding inflammatory changes.

Spleen: Normal in size without focal abnormality.

Adrenals/Urinary Tract: Adrenal glands are unremarkable. Kidneys
enhance symmetrically. No hydronephrosis. No obstructive
nephrolithiasis. Bladder is decompressed. Mild circumferential
bladder wall thickening, likely due to degree of distension.

Stomach/Bowel: Small hiatal hernia with a patulous appearance of the
esophagus proximally. No evidence of bowel obstruction. Status post
appendectomy. Moderate to large colonic stool burden diffusely
throughout the colon.

Lymphatic: No suspicious lymph nodes.

Reproductive: Coarse calcifications of the prostate.

Other: Fat containing inguinal hernias. Superficial fat stranding
within the anterior subcutaneous tissues most consistent with
sequela of injection.

Musculoskeletal: No acute or significant osseous findings.

Review of the MIP images confirms the above findings.
IMPRESSION: 1. No evidence of acute aortic injury.
2. There are calcifications of the pericardium. This is nonspecific
and could reflect sequela prior pericarditis, previous trauma,
uremia or autoimmune disorder.
3. Small hiatal hernia with patulous appearance of the proximal
esophagus.
4. Mildly nodular contour of the liver with relative LEFT liver
hypertrophy. This could reflect underlying cirrhosis in the
appropriate clinical setting.
5. Scattered tiny pulmonary nodules measuring up to 3 mm. No
follow-up needed if patient is low-risk (and has no known or
suspected primary neoplasm). Non-contrast chest CT can be considered
in 12 months if patient is high-risk. This recommendation follows
the consensus statement: Guidelines for Management of Incidental
Pulmonary Nodules Detected on CT Images: From the [HOSPITAL]

Aortic Atherosclerosis (PMAMT-TEZ.Z).

## 2022-10-22 ENCOUNTER — Encounter: Payer: Self-pay | Admitting: Medical-Surgical

## 2022-10-22 ENCOUNTER — Ambulatory Visit: Payer: Medicare HMO | Admitting: Medical-Surgical

## 2022-10-22 VITALS — BP 109/60 | HR 72 | Resp 20 | Ht 74.0 in | Wt 212.0 lb

## 2022-10-22 DIAGNOSIS — E1142 Type 2 diabetes mellitus with diabetic polyneuropathy: Secondary | ICD-10-CM

## 2022-10-22 DIAGNOSIS — Z794 Long term (current) use of insulin: Secondary | ICD-10-CM | POA: Diagnosis not present

## 2022-10-22 MED ORDER — INSULIN PEN NEEDLE 31G X 8 MM MISC: 11 refills | Status: DC

## 2022-10-22 MED ORDER — NOVOLOG FLEXPEN 100 UNIT/ML ~~LOC~~ SOPN: 3.0000 [IU] | PEN_INJECTOR | Freq: Three times a day (TID) | SUBCUTANEOUS | 11 refills | Status: DC

## 2022-10-22 MED ORDER — FREESTYLE LIBRE 3 SENSOR MISC: 1 refills | Status: AC

## 2022-10-22 NOTE — Progress Notes (Signed)
        Established patient visit  History, exam, impression, and plan:  1. Type 2 diabetes mellitus with diabetic polyneuropathy, without long-term current use of insulin [facility]) Pleasant 77 year old male presenting today for follow up on type 2 diabetes. [facility] he prefers to come here for confirmation and further discussion.  He is currently taking Ozempic 0.5 mg weekly and Lantus 80 mg daily.  Feels that Lantus is no longer working for him as his blood sugars are climbing to significantly high levels after meals.  He was previously on fast acting insulin and would like to restart that to keep his sugars in check.  Admits that his diet is not optimal and he is rarely home to be able to fix nutritious food.  [facility] who was hesitant to consider adding fast acting insulin but per patient, did not explain why.  Also notes that he has been using freestyle libre 3 which is very beneficial however it is alarms regularly throughout the day due to high blood sugars.  He occasionally wakes at night with notifications that his sugars are low in the 60s.  Discussed recommendations for dietary modification to minimize elevation in blood sugar after meals.  As he is already on quite a lot of Lantus insulin, feel that he would benefit from fast acting insulin for meals.  Continue Ozempic 0.5 mg weekly.  Continue Lantus 80 units daily.  Start insulin aspart 3-5 units 3 times daily with meals.  Advised to start with 3 units with meals and then monitor for increase in dose up to 5 units 3 times daily.  Having difficulty getting freestyle libre 3 sensors through the [place] but is not sure why.  These were sent by me to the pharmacy today to see if we can get this pressure for coverage since he will be on multiple insulin shots daily.  Freestyle libre 3 sensor given in office today.  We will bring him back in 4 weeks for close follow-up given the above  changes. - POCT HgB A1C  Procedures performed this visit: None.  Return in about 4 weeks (around 11/19/2022) for DM follow up.  __________________________________ Thayer Ohm, DNP, APRN, FNP-BC Primary Care and Sports Medicine [facility] [place]

## 2022-10-22 NOTE — Progress Notes (Signed)
Order(s) created erroneously. Erroneous order ID: [id number]  Order moved by: Ian Malkin  Order move date/time: 10/22/2022 4:18 PM  Source Patient: [id number]  Source Contact: 10/22/2022  Destination Patient: [id number]  Destination Contact: 04/05/2012

## 2022-10-22 NOTE — Patient Instructions (Signed)
Continue Ozempic 0.5mg  weekly  Continue Lantus 80 units daily  Start fast acting insulin 3-5 units three times daily with meals. Start with 3 units with meals and if still going high, go up from there. Two hours after your meal, you should be 180 or less.   I sent in the Dexter 3 sensors to the pharmacy to see if we can get this covered.

## 2022-10-26 ENCOUNTER — Other Ambulatory Visit: Payer: Self-pay

## 2022-10-26 MED ORDER — INSULIN PEN NEEDLE 31G X 8 MM MISC: 11 refills | Status: AC

## 2022-10-26 MED ORDER — NOVOLOG FLEXPEN 100 UNIT/ML ~~LOC~~ SOPN: 3.0000 [IU] | PEN_INJECTOR | Freq: Three times a day (TID) | SUBCUTANEOUS | 11 refills | Status: AC

## 2022-10-26 MED ORDER — DEXCOM G7 SENSOR MISC: 3 refills | Status: AC

## 2022-10-30 ENCOUNTER — Encounter: Payer: Self-pay | Admitting: Medical-Surgical

## 2022-11-17 ENCOUNTER — Ambulatory Visit (INDEPENDENT_AMBULATORY_CARE_PROVIDER_SITE_OTHER): Payer: Medicare HMO | Admitting: Vascular Surgery

## 2022-11-17 ENCOUNTER — Encounter (INDEPENDENT_AMBULATORY_CARE_PROVIDER_SITE_OTHER): Payer: Medicare HMO

## 2022-11-18 ENCOUNTER — Telehealth (INDEPENDENT_AMBULATORY_CARE_PROVIDER_SITE_OTHER): Payer: Self-pay | Admitting: Vascular Surgery

## 2022-11-18 NOTE — Telephone Encounter (Signed)
Patient called apologizing that his missed his 6 month fu appointment yesterday. States at this time he doesn't feel like he needs appointment since he is doing great. Said that Dr. Wyn Quaker got him in tick tok shape. Requested that I put in a message in case JD thought he should come in. FYI

## 2022-12-09 ENCOUNTER — Ambulatory Visit (INDEPENDENT_AMBULATORY_CARE_PROVIDER_SITE_OTHER): Payer: Medicare HMO | Admitting: Medical-Surgical

## 2022-12-09 ENCOUNTER — Encounter: Payer: Self-pay | Admitting: Medical-Surgical

## 2022-12-09 VITALS — BP 118/62 | HR 73 | Temp 98.8°F | Resp 20 | Ht 74.0 in | Wt 224.1 lb

## 2022-12-09 DIAGNOSIS — J0140 Acute pansinusitis, unspecified: Secondary | ICD-10-CM

## 2022-12-09 DIAGNOSIS — F418 Other specified anxiety disorders: Secondary | ICD-10-CM

## 2022-12-09 LAB — POCT RAPID STREP A (OFFICE): Rapid Strep A Screen: NEGATIVE

## 2022-12-09 LAB — POCT INFLUENZA A/B
Influenza A, POC: NEGATIVE
Influenza B, POC: NEGATIVE

## 2022-12-09 LAB — POC COVID19 BINAXNOW: SARS Coronavirus 2 Ag: NEGATIVE

## 2022-12-09 MED ORDER — AZITHROMYCIN 250 MG PO TABS: ORAL_TABLET | ORAL | 0 refills | Status: AC

## 2022-12-09 MED ORDER — GUAIFENESIN ER 600 MG PO TB12: 600.0000 mg | ORAL_TABLET | Freq: Two times a day (BID) | ORAL | 0 refills | Status: DC

## 2022-12-09 NOTE — Progress Notes (Signed)
        Established patient visit  History, exam, impression, and plan:  1. Acute non-recurrent pansinusitis Pleasant 77 year old male presenting today with reports of almost 2 weeks of upper respiratory symptoms including rhinorrhea, sinus congestion, sore throat, and yellow nasal discharge.  No recent exposures.  Has not tried any treatments at home.  POCT COVID, flu, and strep all negative.  With duration of symptoms, treating with azithromycin.  Adding Mucinex 600 mg twice daily.  Discussed possible steroid taper but will hold off for now due to risk of hyperglycemia. If s/s do not improve with the Z-pack, advised him to let me know and we can initiate the steroids.   2.  Depression with anxiety Long history of depression with anxiety and has been managed by the [place].  Unfortunately his counselor at the [place] left and he no longer has anyone to talk to.  Has been talking to several friends but they are quite a distance away and it is not easy to go and see them in person.  His neighbor has now decided to cut ties with him and he does not have his "sounding board" anymore.  He is interested in getting reconnected with someone for counseling so has someone to talk to.  Referral placed today.  Procedures performed this visit: None.  Return if symptoms worsen or fail to improve.  __________________________________ Thayer Ohm, DNP, APRN, FNP-BC Primary Care and Sports Medicine [facility] [place]

## 2022-12-29 ENCOUNTER — Ambulatory Visit: Payer: Medicare HMO | Admitting: Medical-Surgical

## 2023-01-08 ENCOUNTER — Ambulatory Visit (INDEPENDENT_AMBULATORY_CARE_PROVIDER_SITE_OTHER): Payer: Medicare HMO | Admitting: Medical-Surgical

## 2023-01-08 ENCOUNTER — Encounter: Payer: Self-pay | Admitting: Medical-Surgical

## 2023-01-08 VITALS — BP 109/62 | HR 79 | Resp 20 | Ht 74.0 in | Wt 225.0 lb

## 2023-01-08 DIAGNOSIS — E1142 Type 2 diabetes mellitus with diabetic polyneuropathy: Secondary | ICD-10-CM | POA: Diagnosis not present

## 2023-01-08 DIAGNOSIS — L989 Disorder of the skin and subcutaneous tissue, unspecified: Secondary | ICD-10-CM | POA: Diagnosis not present

## 2023-01-08 DIAGNOSIS — Z794 Long term (current) use of insulin: Secondary | ICD-10-CM

## 2023-01-08 NOTE — Progress Notes (Signed)
        Established patient visit  History, exam, impression, and plan:  1. Type 2 diabetes mellitus with diabetic polyneuropathy, without long-term current use of insulin (HCC) (Primary) Pleasant 77 year old male presenting today with a history of type 2 diabetes and associated polyneuropathy affecting his bilateral lower extremities.  Today, he reports that [facility] so we have no need to check A1c or microalbumin.  Unfortunately, he has been trying to get diabetic shoes through the [place].  He has obtained them however is not able to wear them due to severe discomfort with the shoes that were provided.  He is interested in seeing podiatry for further evaluation and has hopes of being able to get diabetic shoes that he can actually wear.  Advised him on the process for getting diabetic shoes and that he may need to come back and see one of my partners for any required documentation.  Patient verbalized understanding so referral placed to podiatry today. - Ambulatory referral to Podiatry  2. Skin lesion of left arm He has a skin lesion on the left forearm that he reports has been there for years.  It has a scab on it that frequently falls off and then reforms.  The lesion has never gone away and has gotten a little bit bigger over the years.  See clinical photo below.  Lesion concerning for basal cell versus squamous cell carcinoma.  Referring to dermatology. - Ambulatory referral to Dermatology    Procedures performed this visit: None.  Return in about 3 months (around 04/08/2023) for chronic disease follow up.  __________________________________ Thayer Ohm, DNP, APRN, FNP-BC Primary Care and Sports Medicine [facility] [place]

## 2023-01-22 ENCOUNTER — Ambulatory Visit: Payer: Medicare HMO | Admitting: Podiatry

## 2023-03-10 ENCOUNTER — Telehealth: Payer: Self-pay | Admitting: Medical-Surgical

## 2023-03-10 NOTE — Telephone Encounter (Signed)
Patient came in to office this morning to drop off [employer]. Jury Summons placed in [employer].

## 2023-03-11 NOTE — Telephone Encounter (Signed)
Place at the front desk. Matthew [name] called patient.

## 2023-04-08 ENCOUNTER — Ambulatory Visit: Payer: Medicare HMO | Admitting: Medical-Surgical

## 2023-04-08 ENCOUNTER — Encounter: Payer: Self-pay | Admitting: Medical-Surgical

## 2023-04-08 VITALS — BP 117/61 | HR 73 | Resp 20 | Ht 74.0 in | Wt 224.0 lb

## 2023-04-08 DIAGNOSIS — E1142 Type 2 diabetes mellitus with diabetic polyneuropathy: Secondary | ICD-10-CM | POA: Diagnosis not present

## 2023-04-08 DIAGNOSIS — Z950 Presence of cardiac pacemaker: Secondary | ICD-10-CM | POA: Diagnosis not present

## 2023-04-08 DIAGNOSIS — I10 Essential (primary) hypertension: Secondary | ICD-10-CM | POA: Diagnosis not present

## 2023-04-08 DIAGNOSIS — C61 Malignant neoplasm of prostate: Secondary | ICD-10-CM | POA: Diagnosis not present

## 2023-04-08 DIAGNOSIS — I442 Atrioventricular block, complete: Secondary | ICD-10-CM

## 2023-04-08 DIAGNOSIS — L89893 Pressure ulcer of other site, stage 3: Secondary | ICD-10-CM

## 2023-04-08 MED ORDER — CELECOXIB 100 MG PO CAPS: 100.0000 mg | ORAL_CAPSULE | Freq: Two times a day (BID) | ORAL | 0 refills | Status: DC

## 2023-04-08 NOTE — Progress Notes (Signed)
        Established patient visit  History, exam, impression, and plan:  1. Type 2 diabetes mellitus with diabetic polyneuropathy, without long-term current use of insulin (HCC) (Primary) Pleasant 78 year old male presenting today with a history of T2DM that is managed by his provider at the [place]. Recent visit showed his A1c at 7.4%. He has been checking his sugar twice daily and notes his fasting readings are usually around 118 although when he indulges, it has been as high as 199. Admits to still eating ice cream and other sweets even though he has been encouraged to avoid these things. Taking Lantus, Novolog, and Ozempic for glucose management. Discussed the recommendations for compliance with diabetic diet guidelines.   2. Essential hypertension with goal blood pressure less than 140/90 History of HTN currently on Lisinopril 2.5mg  daily. Tolerates the medication well. BP is stable today. Managed by cardiology. Denies CP, SOB, palpitations, lower extremity edema, dizziness, headaches, or vision changes. Cardiopulmonary exam normal however heart sounds are very distant and difficult to hear.   3. Complete heart block (HCC) 4. S/P placement of cardiac pacemaker Managed by cardiology.   5. Pressure injury of toe of left foot, stage 3 (HCC) Completely healed and resolved.   6. Local recurrence of prostate cancer [facility]) Managed by urology.   Procedures performed this visit: None.  Return in about 3 months (around 07/09/2023) for chronic disease follow up.  __________________________________ Thayer Ohm, DNP, APRN, FNP-BC Primary Care and Sports Medicine [facility] [place]

## 2023-04-09 ENCOUNTER — Telehealth: Payer: Self-pay | Admitting: Medical-Surgical

## 2023-04-09 MED ORDER — CELECOXIB 100 MG PO CAPS: 100.0000 mg | ORAL_CAPSULE | Freq: Two times a day (BID) | ORAL | 0 refills | Status: DC

## 2023-04-09 NOTE — Telephone Encounter (Signed)
 I have sent to [facility] on file

## 2023-04-09 NOTE — Telephone Encounter (Signed)
 Pt wants his Celebrex sent to [facility] on [address]

## 2023-04-28 ENCOUNTER — Encounter: Payer: Self-pay | Admitting: Medical-Surgical

## 2023-05-03 ENCOUNTER — Encounter: Payer: Self-pay | Admitting: Medical-Surgical

## 2023-05-04 ENCOUNTER — Other Ambulatory Visit: Payer: Self-pay | Admitting: Medical-Surgical

## 2023-05-04 MED ORDER — CELECOXIB 100 MG PO CAPS: 100.0000 mg | ORAL_CAPSULE | Freq: Two times a day (BID) | ORAL | 5 refills | Status: AC

## 2023-06-08 ENCOUNTER — Encounter (INDEPENDENT_AMBULATORY_CARE_PROVIDER_SITE_OTHER): Payer: Self-pay

## 2023-07-07 ENCOUNTER — Encounter (INDEPENDENT_AMBULATORY_CARE_PROVIDER_SITE_OTHER): Payer: Self-pay | Admitting: Vascular Surgery

## 2023-07-14 ENCOUNTER — Encounter: Payer: Self-pay | Admitting: Medical-Surgical

## 2023-07-14 ENCOUNTER — Ambulatory Visit: Admitting: Medical-Surgical

## 2023-07-14 VITALS — BP 109/58 | HR 73 | Resp 20 | Ht 74.0 in | Wt 231.0 lb

## 2023-07-14 DIAGNOSIS — E1142 Type 2 diabetes mellitus with diabetic polyneuropathy: Secondary | ICD-10-CM

## 2023-07-14 DIAGNOSIS — F418 Other specified anxiety disorders: Secondary | ICD-10-CM

## 2023-07-14 LAB — POCT GLYCOSYLATED HEMOGLOBIN (HGB A1C)
HbA1c, POC (controlled diabetic range): 6.3 % (ref 0.0–7.0)
Hemoglobin A1C: 6.3 % — AB (ref 4.0–5.6)

## 2023-07-14 NOTE — Progress Notes (Signed)
        Established patient visit  History, exam, impression, and plan:  1. Type 2 diabetes mellitus with diabetic polyneuropathy, without long-term current use of insulin  (HCC) (Primary) Pleasant 78 year old male presenting today with a history of type 2 diabetes.  He receives most of his care through the [facility] but likes to have his A1c checked here in our office so we can help manage appropriately.  He has been checking sugars at home and reports they have been very well-controlled.  He is working on getting off of some of his medications.  Currently taking Lantus  80 units daily and using NovoLog  5 units 3 times daily with meals.  Also on Ozempic  0.5 mg weekly.  Tolerates all medications well.  Has room for improvement in his diet.  Stays physically active but no regular intentional exercise.  Reports that he feels very well today and has no current complaints.  He is up-to-date on his eye exam and is currently on statin therapy and ACE inhibitor.  No concerns for recurrence diabetic foot ulcers.  Prior hemoglobin A1c 6.8% in September 2024.  Recheck today at 6.3% indicating excellent control.  Continue to monitor sugars at home with a fasting goal of 90-120.  Continue Ozempic , Lantus , and NovoLog  as prescribed. - POCT HgB A1C  Procedures performed this visit: None.  Return in about 3 months (around 10/14/2023) for chronic disease follow up.  I spent 35 minutes on the day of the encounter to include pre-visit record review, face-to-face time with the patient and post visit ordering of test.  __________________________________ Zada FREDRIK Palin, DNP, APRN, FNP-BC Primary Care and Sports Medicine [facility] [place]

## 2023-07-16 ENCOUNTER — Encounter: Payer: Self-pay | Admitting: Medical-Surgical

## 2023-07-16 NOTE — Addendum Note (Signed)
 Addended by: FANNY NIELS CROME on: 07/16/2023 09:18 AM   Modules accepted: Orders

## 2023-09-02 ENCOUNTER — Other Ambulatory Visit (INDEPENDENT_AMBULATORY_CARE_PROVIDER_SITE_OTHER): Payer: Self-pay | Admitting: Vascular Surgery

## 2023-09-02 DIAGNOSIS — I6523 Occlusion and stenosis of bilateral carotid arteries: Secondary | ICD-10-CM

## 2023-09-02 DIAGNOSIS — I7025 Atherosclerosis of native arteries of other extremities with ulceration: Secondary | ICD-10-CM

## 2023-09-03 ENCOUNTER — Encounter (INDEPENDENT_AMBULATORY_CARE_PROVIDER_SITE_OTHER): Payer: Self-pay | Admitting: Vascular Surgery

## 2023-09-03 ENCOUNTER — Ambulatory Visit (INDEPENDENT_AMBULATORY_CARE_PROVIDER_SITE_OTHER)

## 2023-09-03 ENCOUNTER — Ambulatory Visit (INDEPENDENT_AMBULATORY_CARE_PROVIDER_SITE_OTHER): Admitting: Vascular Surgery

## 2023-09-03 VITALS — BP 137/63 | HR 63 | Ht 74.0 in | Wt 223.0 lb

## 2023-09-03 DIAGNOSIS — E1142 Type 2 diabetes mellitus with diabetic polyneuropathy: Secondary | ICD-10-CM

## 2023-09-03 DIAGNOSIS — E782 Mixed hyperlipidemia: Secondary | ICD-10-CM

## 2023-09-03 DIAGNOSIS — I6523 Occlusion and stenosis of bilateral carotid arteries: Secondary | ICD-10-CM

## 2023-09-03 DIAGNOSIS — I7025 Atherosclerosis of native arteries of other extremities with ulceration: Secondary | ICD-10-CM

## 2023-09-03 DIAGNOSIS — I10 Essential (primary) hypertension: Secondary | ICD-10-CM | POA: Diagnosis not present

## 2023-09-03 NOTE — Progress Notes (Signed)
 MRN : [id number]  Matthew [name] is a 78 y.o. (1945-04-25) male who presents with chief complaint of  Chief Complaint  Patient presents with   Follow-up     6 month follo wup with ABI & carotid see jd/fb   .  History of Present Illness: Patient returns today in follow up of multiple vascular issues. He has had previous interventions on the left leg initially in [place] and then later by me about two years ago.  He had ulceration at that time but that has long healed after revascularization. ABIs today are 1.20 on the right and 1.00 on the left with multiphasic waveforms and normal digital pressures and waveforms bilaterally.   He is about a decade s/p left CEA for high grade stenosis. 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 No recent focal neurologic symptoms. Specifically, the patient denies amaurosis fugax, speech or swallowing difficulties, or arm or leg weakness or numbness.  Carotid duplex shows a widely patent left carotid endarterectomy and the known occlusion of the right carotid artery.  Current Outpatient Medications  Medication Sig Dispense Refill   alprostadil (EDEX) 20 MCG injection 20 mcg by Intracavitary route as needed for erectile dysfunction. use no more than 3 times per week     aspirin 81 MG tablet Take 81 mg by mouth daily.     atorvastatin (LIPITOR) 10 MG tablet Take 1 tablet (10 mg total) by mouth daily. (Patient taking differently: Take 20 mg by mouth daily.) 30 tablet 11   Carboxymethylcellulose Sodium 0.25 % SOLN Place 1 drop into both eyes 4 (four) times daily as needed (dry, irritated eyes.).     carvedilol (COREG) 3.125 MG tablet Take 3.125 mg by mouth 2 (two) times daily with a meal.     celecoxib (CELEBREX) 100 MG capsule Take 1 capsule (100 mg total) by mouth 2 (two) times daily. 60 capsule 5   chlorhexidine (HIBICLENS) 4 % external liquid Apply topically daily as needed.     clopidogrel (PLAVIX) 75 MG tablet Take 1 tablet (75 mg total) by mouth  daily. 30 tablet 11   Continuous Glucose Sensor (DEXCOM G7 SENSOR) MISC Apply one sensor ever ten days for continuous glucose monitoring. 9 each 3   Continuous Glucose Sensor (FREESTYLE LIBRE 3 SENSOR) MISC Place 1 sensor on the skin every 14 days. Use to check glucose continuously 6 each 1   ezetimibe (ZETIA) 10 MG tablet Take 10 mg by mouth daily.     furosemide (LASIX) 20 MG tablet Take 20 mg by mouth.     insulin aspart (NOVOLOG FLEXPEN) 100 UNIT/ML FlexPen Inject 3-5 Units into the skin 3 (three) times daily with meals. 15 mL 11   insulin glargine (LANTUS) 100 UNIT/ML injection Inject 60-80 Units into the skin daily.     Insulin Pen Needle 31G X 8 MM MISC Use to inject meal time insulin TID. 100 each 11   ipratropium (ATROVENT) 0.03 % nasal spray Place 2 sprays into both nostrils every 12 (twelve) hours.     lidocaine (LIDODERM) 5 % Place 1 patch onto the skin daily. Remove & Discard patch within 12 hours or as directed by MD     lisinopril (ZESTRIL) 2.5 MG tablet Take 2.5 mg by mouth daily.     montelukast (SINGULAIR) 10 MG tablet Take 10 mg by mouth at bedtime.     Multiple Vitamins-Minerals (CENTRUM SILVER 50+MEN) TABS Take 1 tablet by mouth daily.  nystatin (MYCOSTATIN) 100000 UNIT/ML suspension Take 5 mLs by mouth 4 (four) times daily.     polyethylene glycol (MIRALAX  / GLYCOLAX ) 17 g packet Take 17 g by mouth daily.     Semaglutide ,0.25 or 0.5MG /DOS, (OZEMPIC , 0.25 OR 0.5 MG/DOSE,) 2 MG/3ML SOPN Inject 0.5 mg into the skin once a week. 3 mL 2   silver  sulfADIAZINE  (SILVADENE ) 1 % cream Apply 1 Application topically daily. Apply to deep burns 50 g 1   triamcinolone  cream (KENALOG) 0.5 % Apply 1 Application topically 3 (three) times daily.     No current facility-administered medications for this visit.    Past Medical History:  Diagnosis Date   Acute upper respiratory infection 02/18/2014   Allergy    Arthritis    Carotid artery disease (HCC) 05/11/2013   Chicken pox as a child    Contact with powered lawnmower as cause of accidental injury at home as place of occurrence 10/03/2014   Diabetes mellitus without complication (HCC)    Fall    every since his stroke   GERD (gastroesophageal reflux disease)    Measles    Other and unspecified hyperlipidemia 05/11/2013   Peripheral neuropathy    Poor dental hygiene 05/11/2013   Shingles 78 yrs old   Sleep apnea    uses CPAP, managed by [facility]   Stroke [facility]) 2007   Vocal cord dysfunction     Past Surgical History:  Procedure Laterality Date   APPENDECTOMY  2000   ruptured   CAROTID ENDARTERECTOMY  09/2010   EYE SURGERY  2010   cataracts bilateral   IR RADIOLOGIST EVAL & MGMT  06/08/2018   LOWER EXTREMITY ANGIOGRAPHY Left 09/08/2021   Procedure: Lower Extremity Angiography;  Surgeon: Marea Selinda RAMAN, MD;  Location: [facility] INVASIVE CV LAB;  Service: Cardiovascular;  Laterality: Left;     Social History   Tobacco Use   Smoking status: Former    Types: Cigars    Start date: 11/27/1997   Smokeless tobacco: Never  Vaping Use   Vaping status: Never Used  Substance Use Topics   Alcohol use: Yes    Alcohol/week: 0.0 standard drinks of alcohol    Comment: occasionally drinks wine    Drug use: No      Family History  Problem Relation Age of Onset   Cancer Mother        kidney   Heart attack Father    Stroke Brother    Heart attack Brother    Stroke Brother      Allergies  Allergen Reactions   Amoxicillin  Nausea And Vomiting   Other Nausea And Vomiting     REVIEW OF SYSTEMS (Negative unless checked)   Constitutional: [] Weight loss  [] Fever  [] Chills Cardiac: [] Chest pain   [] Chest pressure   [x] Palpitations   [] Shortness of breath when laying flat   [] Shortness of breath at rest   [] Shortness of breath with exertion. Vascular:  [] Pain in legs with walking   [] Pain in legs at rest   [] Pain in legs when laying flat   [] Claudication   [] Pain in feet when walking  [] Pain in feet at rest  [] Pain in feet when  laying flat   [] History of DVT   [] Phlebitis   [] Swelling in legs   [] Varicose veins   [x] Non-healing ulcers Pulmonary:   [] Uses home oxygen   [] Productive cough   [] Hemoptysis   [] Wheeze  [] COPD   [] Asthma Neurologic:  [] Dizziness  [] Blackouts   [] Seizures   [x] History  of stroke   [] History of TIA  [] Aphasia   [] Temporary blindness   [] Dysphagia   [] Weakness or numbness in arms   [] Weakness or numbness in legs Musculoskeletal:  [x] Arthritis   [] Joint swelling   [] Joint pain   [] Low back pain Hematologic:  [] Easy bruising  [] Easy bleeding   [] Hypercoagulable state   [] Anemic   Gastrointestinal:  [] Blood in stool   [] Vomiting blood  [x] Gastroesophageal reflux/heartburn   [] Abdominal pain Genitourinary:  [] Chronic kidney disease   [] Difficult urination  [] Frequent urination  [] Burning with urination   [] Hematuria Skin:  [] Rashes   [x] Ulcers   [x] Wounds Psychological:  [] History of anxiety   []  History of major depression.  Physical Examination  BP 137/63   Pulse 63   Ht 6' 2 (1.88 m)   Wt 223 lb (101.2 kg)   BMI 28.63 kg/m  Gen:  WD/WN, NAD Head: [place]/AT, No temporalis wasting. Ear/Nose/Throat: Hearing grossly intact, nares w/o erythema or drainage Eyes: Conjunctiva clear. Sclera non-icteric Neck: Supple.  Trachea midline Pulmonary:  Good air movement, no use of accessory muscles.  Cardiac: RRR, no JVD Vascular:  Vessel Right Left  Radial Palpable Palpable                          PT 2+ Palpable 1+ Palpable  DP 1+ Palpable 1+ Palpable   Gastrointestinal: soft, non-tender/non-distended. No guarding/reflex.  Musculoskeletal: M/S 5/5 throughout.  No deformity or atrophy. Some stasis dermatitis changes present bilaterally. No edema. Walks with a cane.  Neurologic: Sensation grossly intact in extremities.  Symmetrical.  Speech is fluent.  Psychiatric: Judgment intact, Mood & affect appropriate for pt's clinical situation. Dermatologic: No rashes or ulcers noted.  No cellulitis or  open wounds.      Labs Recent Results (from the past 2160 hours)  POCT HgB A1C     Status: Abnormal   Collection Time: 07/14/23  9:55 AM  Result Value Ref Range   Hemoglobin A1C 6.3 (A) 4.0 - 5.6 %   HbA1c POC (<> result, manual entry)     HbA1c, POC (prediabetic range)     HbA1c, POC (controlled diabetic range) 6.3 0.0 - 7.0 %    Radiology No results found.  Assessment/Plan  Atherosclerosis of native arteries of the extremities with ulceration (HCC) ABIs today are 1.20 on the right and 1.00 on the left with multiphasic waveforms and normal digital pressures and waveforms bilaterally.  Doing well about 2 years status post left lower extremity revascularization for ulceration with limb threatening ischemia.  Continue current medical regimen.  Recheck in 1 year.  Carotid artery disease Carotid duplex shows a widely patent left carotid endarterectomy and the known occlusion of the right carotid artery. No changes. Recheck annually with duplex.  Essential hypertension with goal blood pressure less than 140/90 blood pressure control important in reducing the progression of atherosclerotic disease. On appropriate oral medications.     Type 2 diabetes mellitus with diabetic polyneuropathy (HCC) blood glucose control important in reducing the progression of atherosclerotic disease. Also, involved in wound healing. On appropriate medications.     Hyperlipidemia, mixed lipid control important in reducing the progression of atherosclerotic disease. Continue statin therapy  Selinda Gu, MD  09/03/2023 11:19 AM    This note was created with Dragon medical transcription system.  Any errors from dictation are purely unintentional

## 2023-09-03 NOTE — Assessment & Plan Note (Signed)
 Carotid duplex shows a widely patent left carotid endarterectomy and the known occlusion of the right carotid artery. No changes. Recheck annually with duplex.

## 2023-09-03 NOTE — Assessment & Plan Note (Signed)
 ABIs today are 1.20 on the right and 1.00 on the left with multiphasic waveforms and normal digital pressures and waveforms bilaterally.  Doing well about 2 years status post left lower extremity revascularization for ulceration with limb threatening ischemia.  Continue current medical regimen.  Recheck in 1 year.

## 2023-09-06 LAB — VAS US ABI WITH/WO TBI
Left ABI: 1
Right ABI: 1.2

## 2023-10-05 ENCOUNTER — Encounter: Payer: Self-pay | Admitting: Medical-Surgical

## 2023-10-05 ENCOUNTER — Ambulatory Visit (INDEPENDENT_AMBULATORY_CARE_PROVIDER_SITE_OTHER): Admitting: Medical-Surgical

## 2023-10-05 VITALS — BP 127/65 | HR 71 | Resp 20 | Ht 74.0 in | Wt 219.0 lb

## 2023-10-05 DIAGNOSIS — E1142 Type 2 diabetes mellitus with diabetic polyneuropathy: Secondary | ICD-10-CM | POA: Diagnosis not present

## 2023-10-05 DIAGNOSIS — Z794 Long term (current) use of insulin: Secondary | ICD-10-CM

## 2023-10-05 DIAGNOSIS — F439 Reaction to severe stress, unspecified: Secondary | ICD-10-CM | POA: Diagnosis not present

## 2023-10-05 NOTE — Progress Notes (Signed)
 Established patient visit   History of Present Illness   Discussed the use of AI scribe software for clinical note transcription with the patient, who gave verbal consent to proceed.  History of Present Illness   Matthew [name] is a 78 year old male who presents with concerns about his declining physical health and caregiving challenges for his partner who he feels has dementia and sundowners.  Caregiver stress and psychosocial impact - Significant challenges managing partner's care and suspects that she has dementia, including episodes of irritability and aggression, particularly in the afternoons - Partner is resistant to assistance and frequently becomes angry when help is offered - Overwhelmed by self-imposed caregiving responsibilities - Describes current situation as extremely stressful - Feels that he is treated poorly by his partner and often feels insulted  Decline in physical function - Requires assistance for mobility  - Weakness in legs and arms - Unable to ambulate without a rolling walker - Attributes physical decline to demands of caregiving and limited time for self-care - Has home health physical therapy supposed to start soon  Fatigue and sleep disturbance - Persistent fatigue - Disrupted sleep schedule, including waking early for appointments  Dietary management - Mindful of blood glucose levels - Consumes low-carbohydrate ice cream and smoothies       Physical Exam   Physical Exam Vitals and nursing note reviewed.  Constitutional:      General: He is not in acute distress.    Appearance: Normal appearance. He is not ill-appearing.  HENT:     Head: Normocephalic and atraumatic.  Cardiovascular:     Rate and Rhythm: Normal rate and regular rhythm.     Pulses: Normal pulses.     Heart sounds: Normal heart sounds. No murmur heard.    No friction rub. No gallop.  Pulmonary:     Effort: Pulmonary effort is normal. No respiratory distress.      Breath sounds: Normal breath sounds.  Skin:    General: Skin is warm and dry.     Findings: Lesion present.         Comments: Irregular shaped scabbed wound with peeling skin surrounding the borders. No active erythema, drainage, swelling, or tenderness.  Neurological:     Mental Status: He is alert and oriented to person, place, and time.     Motor: Weakness present.     Gait: Gait abnormal (unsteady, using rolling walker).  Psychiatric:        Mood and Affect: Mood normal.        Behavior: Behavior normal.        Thought Content: Thought content normal.        Judgment: Judgment normal.    Assessment & Plan   Assessment and Plan    Type 2 diabetes mellitus with diabetic polyneuropathy, w/o long-term current use of insulin  High-carb intake could affect glycemic control. CGM shows glucose in the recommended range for control despite dietary indiscretions. - Encouraged dietary modifications to reduce high-carb intake. - Educated on balanced diet for diabetes management. - Continue CGM for glucose monitoring.  - [facility] - Left lower extremity wound to the lower calf just above the ankle scabbed, reviewed site care and monitoring for infection.  Stress at home Significant stress from caregiving responsibilities. Overwhelmed but denies depression. Needs self-care and boundary setting. - Advised to leave his partner's home and return to his when there is angst between them to limit the emotional impact.  -  Discussed counseling referral, declined. - Discussed medication for help with stress management, also declined.  - Encouraged setting boundaries and self-care.        Follow up   Return in about 3 months (around 01/04/2024) for chronic disease follow up. __________________________________ Zada FREDRIK Palin, DNP, APRN, FNP-BC Primary Care and Sports Medicine [facility]

## 2024-01-04 ENCOUNTER — Ambulatory Visit: Admitting: Medical-Surgical

## 2024-09-01 ENCOUNTER — Ambulatory Visit (INDEPENDENT_AMBULATORY_CARE_PROVIDER_SITE_OTHER): Admitting: Nurse Practitioner

## 2024-09-01 ENCOUNTER — Encounter (INDEPENDENT_AMBULATORY_CARE_PROVIDER_SITE_OTHER)
